# Patient Record
Sex: Female | Born: 1955 | Race: Black or African American | Hispanic: No | Marital: Married | Smoking: Never smoker
Health system: Southern US, Community
[De-identification: ages and names within clinical notes are randomized; demographics above are authoritative.]

## PROBLEM LIST (undated history)

## (undated) ENCOUNTER — Emergency Department (HOSPITAL_COMMUNITY): Disposition: A | Discharge status: Home / Self Care | Payer: Self-pay

## (undated) DIAGNOSIS — N898 Other specified noninflammatory disorders of vagina: Secondary | ICD-10-CM

## (undated) DIAGNOSIS — N951 Menopausal and female climacteric states: Secondary | ICD-10-CM

## (undated) DIAGNOSIS — R011 Cardiac murmur, unspecified: Secondary | ICD-10-CM

## (undated) DIAGNOSIS — D219 Benign neoplasm of connective and other soft tissue, unspecified: Secondary | ICD-10-CM

## (undated) DIAGNOSIS — J45909 Unspecified asthma, uncomplicated: Secondary | ICD-10-CM

## (undated) DIAGNOSIS — K219 Gastro-esophageal reflux disease without esophagitis: Secondary | ICD-10-CM

## (undated) DIAGNOSIS — N952 Postmenopausal atrophic vaginitis: Secondary | ICD-10-CM

## (undated) DIAGNOSIS — T7840XA Allergy, unspecified, initial encounter: Secondary | ICD-10-CM

## (undated) DIAGNOSIS — Z8742 Personal history of other diseases of the female genital tract: Secondary | ICD-10-CM

## (undated) DIAGNOSIS — F32A Depression, unspecified: Secondary | ICD-10-CM

## (undated) DIAGNOSIS — M1990 Unspecified osteoarthritis, unspecified site: Secondary | ICD-10-CM

## (undated) DIAGNOSIS — R6882 Decreased libido: Secondary | ICD-10-CM

## (undated) DIAGNOSIS — R809 Proteinuria, unspecified: Secondary | ICD-10-CM

## (undated) DIAGNOSIS — I10 Essential (primary) hypertension: Secondary | ICD-10-CM

## (undated) DIAGNOSIS — D573 Sickle-cell trait: Secondary | ICD-10-CM

## (undated) DIAGNOSIS — IMO0002 Reserved for concepts with insufficient information to code with codable children: Secondary | ICD-10-CM

## (undated) DIAGNOSIS — E785 Hyperlipidemia, unspecified: Secondary | ICD-10-CM

## (undated) DIAGNOSIS — R102 Pelvic and perineal pain: Secondary | ICD-10-CM

## (undated) DIAGNOSIS — B373 Candidiasis of vulva and vagina: Secondary | ICD-10-CM

## (undated) DIAGNOSIS — Z87440 Personal history of urinary (tract) infections: Secondary | ICD-10-CM

## (undated) DIAGNOSIS — R351 Nocturia: Secondary | ICD-10-CM

## (undated) DIAGNOSIS — F419 Anxiety disorder, unspecified: Secondary | ICD-10-CM

## (undated) DIAGNOSIS — N926 Irregular menstruation, unspecified: Secondary | ICD-10-CM

## (undated) DIAGNOSIS — B9689 Other specified bacterial agents as the cause of diseases classified elsewhere: Secondary | ICD-10-CM

## (undated) DIAGNOSIS — Z8619 Personal history of other infectious and parasitic diseases: Secondary | ICD-10-CM

## (undated) DIAGNOSIS — Z8719 Personal history of other diseases of the digestive system: Secondary | ICD-10-CM

## (undated) DIAGNOSIS — R300 Dysuria: Secondary | ICD-10-CM

## (undated) DIAGNOSIS — F329 Major depressive disorder, single episode, unspecified: Secondary | ICD-10-CM

## (undated) DIAGNOSIS — Z87898 Personal history of other specified conditions: Secondary | ICD-10-CM

## (undated) DIAGNOSIS — N760 Acute vaginitis: Secondary | ICD-10-CM

## (undated) HISTORY — PX: TUBAL LIGATION: SHX77

## (undated) HISTORY — DX: Irregular menstruation, unspecified: N92.6

## (undated) HISTORY — DX: Anxiety disorder, unspecified: F41.9

## (undated) HISTORY — DX: Unspecified asthma, uncomplicated: J45.909

## (undated) HISTORY — DX: Dysuria: R30.0

## (undated) HISTORY — DX: Menopausal and female climacteric states: N95.1

## (undated) HISTORY — PX: UPPER GASTROINTESTINAL ENDOSCOPY: SHX188

## (undated) HISTORY — DX: Allergy, unspecified, initial encounter: T78.40XA

## (undated) HISTORY — DX: Personal history of other infectious and parasitic diseases: Z86.19

## (undated) HISTORY — DX: Nocturia: R35.1

## (undated) HISTORY — DX: Personal history of other specified conditions: Z87.898

## (undated) HISTORY — DX: Sickle-cell trait: D57.3

## (undated) HISTORY — DX: Hyperlipidemia, unspecified: E78.5

## (undated) HISTORY — DX: Other specified noninflammatory disorders of vagina: N89.8

## (undated) HISTORY — PX: HEEL SPUR SURGERY: SHX665

## (undated) HISTORY — DX: Acute vaginitis: N76.0

## (undated) HISTORY — DX: Pelvic and perineal pain: R10.2

## (undated) HISTORY — DX: Reserved for concepts with insufficient information to code with codable children: IMO0002

## (undated) HISTORY — PX: COLONOSCOPY: SHX174

## (undated) HISTORY — DX: Other specified bacterial agents as the cause of diseases classified elsewhere: B96.89

## (undated) HISTORY — DX: Candidiasis of vulva and vagina: B37.3

## (undated) HISTORY — DX: Proteinuria, unspecified: R80.9

## (undated) HISTORY — DX: Postmenopausal atrophic vaginitis: N95.2

## (undated) HISTORY — DX: Personal history of other diseases of the female genital tract: Z87.42

## (undated) HISTORY — DX: Personal history of other diseases of the digestive system: Z87.19

## (undated) HISTORY — PX: POLYPECTOMY: SHX149

## (undated) HISTORY — DX: Personal history of urinary (tract) infections: Z87.440

## (undated) HISTORY — DX: Benign neoplasm of connective and other soft tissue, unspecified: D21.9

## (undated) HISTORY — PX: DENTAL SURGERY: SHX609

## (undated) HISTORY — DX: Decreased libido: R68.82

## (undated) HISTORY — PX: CARPAL TUNNEL RELEASE: SHX101

---

## 1997-07-24 ENCOUNTER — Encounter: Admission: RE | Admit: 1997-07-24 | Discharge: 1997-07-24 | Payer: Self-pay | Admitting: Family Medicine

## 1997-11-06 ENCOUNTER — Encounter: Admission: RE | Admit: 1997-11-06 | Discharge: 1997-11-06 | Payer: Self-pay | Admitting: Family Medicine

## 1997-11-11 ENCOUNTER — Encounter: Admission: RE | Admit: 1997-11-11 | Discharge: 1997-11-11 | Payer: Self-pay | Admitting: Sports Medicine

## 1997-11-20 ENCOUNTER — Encounter: Admission: RE | Admit: 1997-11-20 | Discharge: 1997-11-20 | Payer: Self-pay | Admitting: Family Medicine

## 1997-11-27 ENCOUNTER — Encounter: Admission: RE | Admit: 1997-11-27 | Discharge: 1997-11-27 | Payer: Self-pay | Admitting: Family Medicine

## 1997-12-08 ENCOUNTER — Encounter: Admission: RE | Admit: 1997-12-08 | Discharge: 1997-12-08 | Payer: Self-pay | Admitting: Family Medicine

## 1998-03-11 ENCOUNTER — Encounter: Admission: RE | Admit: 1998-03-11 | Discharge: 1998-03-11 | Payer: Self-pay | Admitting: Family Medicine

## 1998-05-29 ENCOUNTER — Encounter: Admission: RE | Admit: 1998-05-29 | Discharge: 1998-05-29 | Payer: Self-pay | Admitting: Family Medicine

## 1998-06-12 ENCOUNTER — Encounter: Admission: RE | Admit: 1998-06-12 | Discharge: 1998-06-12 | Payer: Self-pay | Admitting: Family Medicine

## 1998-06-29 ENCOUNTER — Encounter: Admission: RE | Admit: 1998-06-29 | Discharge: 1998-06-29 | Payer: Self-pay | Admitting: Sports Medicine

## 1998-07-07 ENCOUNTER — Encounter: Admission: RE | Admit: 1998-07-07 | Discharge: 1998-07-07 | Payer: Self-pay | Admitting: Sports Medicine

## 1998-07-08 ENCOUNTER — Emergency Department (HOSPITAL_COMMUNITY): Admission: EM | Admit: 1998-07-08 | Discharge: 1998-07-08 | Payer: Self-pay | Admitting: Emergency Medicine

## 1998-08-10 ENCOUNTER — Encounter: Admission: RE | Admit: 1998-08-10 | Discharge: 1998-08-10 | Payer: Self-pay | Admitting: Family Medicine

## 1998-08-28 ENCOUNTER — Other Ambulatory Visit: Admission: RE | Admit: 1998-08-28 | Discharge: 1998-08-28 | Payer: Self-pay | Admitting: *Deleted

## 1998-08-28 ENCOUNTER — Encounter: Admission: RE | Admit: 1998-08-28 | Discharge: 1998-08-28 | Payer: Self-pay | Admitting: Family Medicine

## 1998-10-09 ENCOUNTER — Encounter: Admission: RE | Admit: 1998-10-09 | Discharge: 1998-10-09 | Payer: Self-pay | Admitting: Family Medicine

## 1998-10-13 ENCOUNTER — Encounter: Admission: RE | Admit: 1998-10-13 | Discharge: 1998-10-13 | Payer: Self-pay | Admitting: Sports Medicine

## 1998-10-23 ENCOUNTER — Encounter: Admission: RE | Admit: 1998-10-23 | Discharge: 1998-10-23 | Payer: Self-pay | Admitting: Family Medicine

## 1998-11-09 ENCOUNTER — Encounter: Admission: RE | Admit: 1998-11-09 | Discharge: 1998-11-09 | Payer: Self-pay | Admitting: Family Medicine

## 1999-02-19 ENCOUNTER — Encounter: Admission: RE | Admit: 1999-02-19 | Discharge: 1999-02-19 | Payer: Self-pay | Admitting: Family Medicine

## 1999-03-30 ENCOUNTER — Encounter: Admission: RE | Admit: 1999-03-30 | Discharge: 1999-03-30 | Payer: Self-pay | Admitting: Family Medicine

## 1999-08-16 ENCOUNTER — Encounter: Admission: RE | Admit: 1999-08-16 | Discharge: 1999-08-16 | Payer: Self-pay | Admitting: Family Medicine

## 2000-09-19 ENCOUNTER — Encounter: Admission: RE | Admit: 2000-09-19 | Discharge: 2000-09-19 | Payer: Self-pay | Admitting: Family Medicine

## 2001-01-04 ENCOUNTER — Encounter: Admission: RE | Admit: 2001-01-04 | Discharge: 2001-01-04 | Payer: Self-pay | Admitting: Family Medicine

## 2001-01-04 ENCOUNTER — Other Ambulatory Visit: Admission: RE | Admit: 2001-01-04 | Discharge: 2001-01-04 | Payer: Self-pay | Admitting: *Deleted

## 2001-01-26 ENCOUNTER — Encounter: Admission: RE | Admit: 2001-01-26 | Discharge: 2001-01-26 | Payer: Self-pay | Admitting: Family Medicine

## 2001-08-24 ENCOUNTER — Encounter: Admission: RE | Admit: 2001-08-24 | Discharge: 2001-08-24 | Payer: Self-pay | Admitting: Family Medicine

## 2001-10-31 ENCOUNTER — Encounter: Admission: RE | Admit: 2001-10-31 | Discharge: 2001-10-31 | Payer: Self-pay | Admitting: Family Medicine

## 2001-11-02 ENCOUNTER — Encounter: Admission: RE | Admit: 2001-11-02 | Discharge: 2001-11-02 | Payer: Self-pay | Admitting: Family Medicine

## 2002-03-08 ENCOUNTER — Encounter: Admission: RE | Admit: 2002-03-08 | Discharge: 2002-03-08 | Payer: Self-pay | Admitting: Family Medicine

## 2002-03-21 ENCOUNTER — Other Ambulatory Visit: Admission: RE | Admit: 2002-03-21 | Discharge: 2002-03-21 | Payer: Self-pay | Admitting: Family Medicine

## 2002-03-21 ENCOUNTER — Encounter: Admission: RE | Admit: 2002-03-21 | Discharge: 2002-03-21 | Payer: Self-pay | Admitting: Family Medicine

## 2002-07-25 ENCOUNTER — Encounter: Admission: RE | Admit: 2002-07-25 | Discharge: 2002-07-25 | Payer: Self-pay | Admitting: Family Medicine

## 2002-08-07 ENCOUNTER — Encounter: Admission: RE | Admit: 2002-08-07 | Discharge: 2002-08-07 | Payer: Self-pay | Admitting: Family Medicine

## 2002-09-12 ENCOUNTER — Encounter: Admission: RE | Admit: 2002-09-12 | Discharge: 2002-09-12 | Payer: Self-pay | Admitting: Family Medicine

## 2002-10-02 ENCOUNTER — Encounter: Admission: RE | Admit: 2002-10-02 | Discharge: 2002-10-02 | Payer: Self-pay | Admitting: Family Medicine

## 2002-11-07 ENCOUNTER — Encounter: Admission: RE | Admit: 2002-11-07 | Discharge: 2002-11-07 | Payer: Self-pay | Admitting: Family Medicine

## 2002-11-21 ENCOUNTER — Encounter: Admission: RE | Admit: 2002-11-21 | Discharge: 2002-11-21 | Payer: Self-pay | Admitting: Family Medicine

## 2002-12-04 ENCOUNTER — Encounter: Admission: RE | Admit: 2002-12-04 | Discharge: 2002-12-04 | Payer: Self-pay | Admitting: Family Medicine

## 2003-03-27 ENCOUNTER — Encounter: Admission: RE | Admit: 2003-03-27 | Discharge: 2003-03-27 | Payer: Self-pay | Admitting: Allergy and Immunology

## 2003-04-19 DIAGNOSIS — B9689 Other specified bacterial agents as the cause of diseases classified elsewhere: Secondary | ICD-10-CM

## 2003-04-19 DIAGNOSIS — R102 Pelvic and perineal pain: Secondary | ICD-10-CM

## 2003-04-19 DIAGNOSIS — D219 Benign neoplasm of connective and other soft tissue, unspecified: Secondary | ICD-10-CM

## 2003-04-19 DIAGNOSIS — Z8742 Personal history of other diseases of the female genital tract: Secondary | ICD-10-CM

## 2003-04-19 HISTORY — DX: Personal history of other diseases of the female genital tract: Z87.42

## 2003-04-19 HISTORY — DX: Benign neoplasm of connective and other soft tissue, unspecified: D21.9

## 2003-04-19 HISTORY — DX: Other specified bacterial agents as the cause of diseases classified elsewhere: B96.89

## 2003-04-19 HISTORY — DX: Pelvic and perineal pain: R10.2

## 2003-08-15 ENCOUNTER — Other Ambulatory Visit: Admission: RE | Admit: 2003-08-15 | Discharge: 2003-08-15 | Payer: Self-pay | Admitting: Family Medicine

## 2003-08-15 ENCOUNTER — Encounter: Admission: RE | Admit: 2003-08-15 | Discharge: 2003-08-15 | Payer: Self-pay | Admitting: Sports Medicine

## 2003-09-17 ENCOUNTER — Encounter: Admission: RE | Admit: 2003-09-17 | Discharge: 2003-09-17 | Payer: Self-pay | Admitting: Family Medicine

## 2003-09-24 ENCOUNTER — Encounter: Admission: RE | Admit: 2003-09-24 | Discharge: 2003-09-24 | Payer: Self-pay | Admitting: Family Medicine

## 2003-10-15 ENCOUNTER — Encounter: Admission: RE | Admit: 2003-10-15 | Discharge: 2003-10-15 | Payer: Self-pay | Admitting: Family Medicine

## 2003-10-15 ENCOUNTER — Encounter: Admission: RE | Admit: 2003-10-15 | Discharge: 2003-10-15 | Payer: Self-pay | Admitting: Sports Medicine

## 2003-10-30 ENCOUNTER — Emergency Department (HOSPITAL_COMMUNITY): Admission: EM | Admit: 2003-10-30 | Discharge: 2003-10-30 | Payer: Self-pay | Admitting: Family Medicine

## 2003-11-04 ENCOUNTER — Emergency Department (HOSPITAL_COMMUNITY): Admission: EM | Admit: 2003-11-04 | Discharge: 2003-11-04 | Payer: Self-pay | Admitting: Emergency Medicine

## 2003-11-04 ENCOUNTER — Encounter: Admission: RE | Admit: 2003-11-04 | Discharge: 2003-11-04 | Payer: Self-pay | Admitting: Family Medicine

## 2003-11-08 ENCOUNTER — Emergency Department (HOSPITAL_COMMUNITY): Admission: EM | Admit: 2003-11-08 | Discharge: 2003-11-08 | Payer: Self-pay | Admitting: Emergency Medicine

## 2003-11-11 ENCOUNTER — Encounter: Admission: RE | Admit: 2003-11-11 | Discharge: 2003-11-11 | Payer: Self-pay | Admitting: Family Medicine

## 2003-11-25 ENCOUNTER — Encounter: Admission: RE | Admit: 2003-11-25 | Discharge: 2003-11-25 | Payer: Self-pay | Admitting: Family Medicine

## 2004-01-08 ENCOUNTER — Ambulatory Visit: Payer: Self-pay | Admitting: Family Medicine

## 2004-01-24 ENCOUNTER — Emergency Department (HOSPITAL_COMMUNITY): Admission: EM | Admit: 2004-01-24 | Discharge: 2004-01-24 | Payer: Self-pay | Admitting: Emergency Medicine

## 2004-02-02 ENCOUNTER — Encounter: Admission: RE | Admit: 2004-02-02 | Discharge: 2004-02-02 | Payer: Self-pay | Admitting: Allergy and Immunology

## 2004-02-03 ENCOUNTER — Encounter: Admission: RE | Admit: 2004-02-03 | Discharge: 2004-02-03 | Payer: Self-pay | Admitting: Allergy and Immunology

## 2004-03-09 ENCOUNTER — Ambulatory Visit: Payer: Self-pay | Admitting: Family Medicine

## 2004-04-01 ENCOUNTER — Ambulatory Visit: Payer: Self-pay | Admitting: Family Medicine

## 2004-04-09 ENCOUNTER — Ambulatory Visit: Payer: Self-pay | Admitting: Infectious Diseases

## 2004-04-18 DIAGNOSIS — B373 Candidiasis of vulva and vagina: Secondary | ICD-10-CM

## 2004-04-18 DIAGNOSIS — B3731 Acute candidiasis of vulva and vagina: Secondary | ICD-10-CM

## 2004-04-18 DIAGNOSIS — R102 Pelvic and perineal pain: Secondary | ICD-10-CM

## 2004-04-18 DIAGNOSIS — R6882 Decreased libido: Secondary | ICD-10-CM

## 2004-04-18 HISTORY — DX: Candidiasis of vulva and vagina: B37.3

## 2004-04-18 HISTORY — DX: Pelvic and perineal pain: R10.2

## 2004-04-18 HISTORY — DX: Decreased libido: R68.82

## 2004-04-18 HISTORY — DX: Acute candidiasis of vulva and vagina: B37.31

## 2004-04-23 ENCOUNTER — Ambulatory Visit: Payer: Self-pay | Admitting: Family Medicine

## 2004-05-07 ENCOUNTER — Ambulatory Visit: Payer: Self-pay | Admitting: Sports Medicine

## 2004-05-16 ENCOUNTER — Emergency Department (HOSPITAL_COMMUNITY): Admission: EM | Admit: 2004-05-16 | Discharge: 2004-05-16 | Payer: Self-pay | Admitting: Family Medicine

## 2004-05-26 ENCOUNTER — Ambulatory Visit: Payer: Self-pay | Admitting: Family Medicine

## 2004-06-08 ENCOUNTER — Emergency Department (HOSPITAL_COMMUNITY): Admission: EM | Admit: 2004-06-08 | Discharge: 2004-06-08 | Payer: Self-pay | Admitting: Family Medicine

## 2004-07-14 ENCOUNTER — Ambulatory Visit: Payer: Self-pay | Admitting: Family Medicine

## 2004-08-16 ENCOUNTER — Encounter (INDEPENDENT_AMBULATORY_CARE_PROVIDER_SITE_OTHER): Payer: Self-pay | Admitting: *Deleted

## 2004-08-16 LAB — CONVERTED CEMR LAB

## 2004-08-24 ENCOUNTER — Ambulatory Visit: Payer: Self-pay | Admitting: Family Medicine

## 2004-09-06 ENCOUNTER — Ambulatory Visit: Payer: Self-pay | Admitting: Family Medicine

## 2004-11-12 ENCOUNTER — Ambulatory Visit: Payer: Self-pay | Admitting: Family Medicine

## 2005-01-04 ENCOUNTER — Ambulatory Visit: Payer: Self-pay | Admitting: Sports Medicine

## 2005-01-08 ENCOUNTER — Emergency Department (HOSPITAL_COMMUNITY): Admission: EM | Admit: 2005-01-08 | Discharge: 2005-01-08 | Payer: Self-pay | Admitting: Emergency Medicine

## 2005-01-11 ENCOUNTER — Other Ambulatory Visit: Admission: RE | Admit: 2005-01-11 | Discharge: 2005-01-11 | Payer: Self-pay | Admitting: Obstetrics and Gynecology

## 2005-03-23 ENCOUNTER — Emergency Department (HOSPITAL_COMMUNITY): Admission: EM | Admit: 2005-03-23 | Discharge: 2005-03-23 | Payer: Self-pay | Admitting: Family Medicine

## 2005-03-31 ENCOUNTER — Emergency Department (HOSPITAL_COMMUNITY): Admission: EM | Admit: 2005-03-31 | Discharge: 2005-03-31 | Payer: Self-pay | Admitting: Family Medicine

## 2005-04-18 DIAGNOSIS — N926 Irregular menstruation, unspecified: Secondary | ICD-10-CM

## 2005-04-18 DIAGNOSIS — N951 Menopausal and female climacteric states: Secondary | ICD-10-CM

## 2005-04-18 DIAGNOSIS — Z87440 Personal history of urinary (tract) infections: Secondary | ICD-10-CM

## 2005-04-18 DIAGNOSIS — R300 Dysuria: Secondary | ICD-10-CM

## 2005-04-18 HISTORY — DX: Dysuria: R30.0

## 2005-04-18 HISTORY — DX: Irregular menstruation, unspecified: N92.6

## 2005-04-18 HISTORY — DX: Menopausal and female climacteric states: N95.1

## 2005-04-18 HISTORY — DX: Personal history of urinary (tract) infections: Z87.440

## 2005-05-06 ENCOUNTER — Ambulatory Visit: Payer: Self-pay | Admitting: Family Medicine

## 2005-06-22 ENCOUNTER — Emergency Department (HOSPITAL_COMMUNITY): Admission: EM | Admit: 2005-06-22 | Discharge: 2005-06-22 | Payer: Self-pay | Admitting: Family Medicine

## 2005-06-28 ENCOUNTER — Ambulatory Visit: Payer: Self-pay | Admitting: Family Medicine

## 2005-07-01 ENCOUNTER — Ambulatory Visit: Payer: Self-pay | Admitting: Sports Medicine

## 2005-09-01 ENCOUNTER — Emergency Department (HOSPITAL_COMMUNITY): Admission: EM | Admit: 2005-09-01 | Discharge: 2005-09-01 | Payer: Self-pay | Admitting: Emergency Medicine

## 2005-09-30 ENCOUNTER — Ambulatory Visit: Payer: Self-pay | Admitting: Family Medicine

## 2005-11-14 ENCOUNTER — Ambulatory Visit: Payer: Self-pay | Admitting: Sports Medicine

## 2006-01-19 ENCOUNTER — Other Ambulatory Visit: Admission: RE | Admit: 2006-01-19 | Discharge: 2006-01-19 | Payer: Self-pay | Admitting: Obstetrics and Gynecology

## 2006-04-18 DIAGNOSIS — IMO0002 Reserved for concepts with insufficient information to code with codable children: Secondary | ICD-10-CM

## 2006-04-18 HISTORY — DX: Reserved for concepts with insufficient information to code with codable children: IMO0002

## 2006-04-24 ENCOUNTER — Ambulatory Visit: Payer: Self-pay | Admitting: Family Medicine

## 2006-06-04 ENCOUNTER — Emergency Department (HOSPITAL_COMMUNITY): Admission: EM | Admit: 2006-06-04 | Discharge: 2006-06-04 | Payer: Self-pay | Admitting: Emergency Medicine

## 2006-06-15 DIAGNOSIS — K279 Peptic ulcer, site unspecified, unspecified as acute or chronic, without hemorrhage or perforation: Secondary | ICD-10-CM | POA: Insufficient documentation

## 2006-06-15 DIAGNOSIS — J309 Allergic rhinitis, unspecified: Secondary | ICD-10-CM | POA: Insufficient documentation

## 2006-06-15 DIAGNOSIS — K219 Gastro-esophageal reflux disease without esophagitis: Secondary | ICD-10-CM

## 2006-06-15 DIAGNOSIS — D573 Sickle-cell trait: Secondary | ICD-10-CM | POA: Insufficient documentation

## 2006-06-15 DIAGNOSIS — D509 Iron deficiency anemia, unspecified: Secondary | ICD-10-CM

## 2006-06-16 ENCOUNTER — Encounter (INDEPENDENT_AMBULATORY_CARE_PROVIDER_SITE_OTHER): Payer: Self-pay | Admitting: *Deleted

## 2006-09-15 ENCOUNTER — Encounter (INDEPENDENT_AMBULATORY_CARE_PROVIDER_SITE_OTHER): Payer: Self-pay | Admitting: Family Medicine

## 2006-12-06 ENCOUNTER — Encounter: Payer: Self-pay | Admitting: Family Medicine

## 2006-12-06 ENCOUNTER — Ambulatory Visit: Payer: Self-pay | Admitting: Family Medicine

## 2006-12-06 LAB — CONVERTED CEMR LAB
Chlamydia, DNA Probe: NEGATIVE
GC Probe Amp, Genital: NEGATIVE
KOH Prep: NEGATIVE
Whiff Test: NEGATIVE

## 2006-12-07 ENCOUNTER — Encounter: Payer: Self-pay | Admitting: Family Medicine

## 2007-02-02 ENCOUNTER — Telehealth: Payer: Self-pay | Admitting: *Deleted

## 2007-02-03 ENCOUNTER — Emergency Department (HOSPITAL_COMMUNITY): Admission: EM | Admit: 2007-02-03 | Discharge: 2007-02-03 | Payer: Self-pay | Admitting: Emergency Medicine

## 2007-02-05 ENCOUNTER — Ambulatory Visit: Payer: Self-pay | Admitting: Sports Medicine

## 2007-02-05 DIAGNOSIS — I10 Essential (primary) hypertension: Secondary | ICD-10-CM

## 2007-04-19 ENCOUNTER — Emergency Department (HOSPITAL_COMMUNITY): Admission: EM | Admit: 2007-04-19 | Discharge: 2007-04-19 | Payer: Self-pay | Admitting: Emergency Medicine

## 2007-04-19 DIAGNOSIS — R809 Proteinuria, unspecified: Secondary | ICD-10-CM

## 2007-04-19 DIAGNOSIS — Z8719 Personal history of other diseases of the digestive system: Secondary | ICD-10-CM

## 2007-04-19 DIAGNOSIS — N952 Postmenopausal atrophic vaginitis: Secondary | ICD-10-CM

## 2007-04-19 DIAGNOSIS — R351 Nocturia: Secondary | ICD-10-CM

## 2007-04-19 HISTORY — DX: Proteinuria, unspecified: R80.9

## 2007-04-19 HISTORY — DX: Nocturia: R35.1

## 2007-04-19 HISTORY — DX: Personal history of other diseases of the digestive system: Z87.19

## 2007-04-19 HISTORY — DX: Postmenopausal atrophic vaginitis: N95.2

## 2007-05-07 ENCOUNTER — Emergency Department (HOSPITAL_COMMUNITY): Admission: EM | Admit: 2007-05-07 | Discharge: 2007-05-07 | Payer: Self-pay | Admitting: Family Medicine

## 2007-07-06 ENCOUNTER — Telehealth: Payer: Self-pay | Admitting: *Deleted

## 2007-07-13 ENCOUNTER — Ambulatory Visit: Payer: Self-pay | Admitting: Family Medicine

## 2007-07-25 ENCOUNTER — Ambulatory Visit: Payer: Self-pay | Admitting: Family Medicine

## 2007-07-30 ENCOUNTER — Emergency Department (HOSPITAL_COMMUNITY): Admission: EM | Admit: 2007-07-30 | Discharge: 2007-07-30 | Payer: Self-pay | Admitting: Family Medicine

## 2007-08-17 ENCOUNTER — Emergency Department (HOSPITAL_COMMUNITY): Admission: EM | Admit: 2007-08-17 | Discharge: 2007-08-17 | Payer: Self-pay | Admitting: Family Medicine

## 2007-10-26 ENCOUNTER — Emergency Department (HOSPITAL_COMMUNITY): Admission: EM | Admit: 2007-10-26 | Discharge: 2007-10-26 | Payer: Self-pay | Admitting: Family Medicine

## 2007-11-19 ENCOUNTER — Encounter: Payer: Self-pay | Admitting: *Deleted

## 2007-11-23 ENCOUNTER — Encounter (INDEPENDENT_AMBULATORY_CARE_PROVIDER_SITE_OTHER): Payer: Self-pay | Admitting: Family Medicine

## 2007-11-23 ENCOUNTER — Ambulatory Visit: Payer: Self-pay | Admitting: Family Medicine

## 2007-11-23 DIAGNOSIS — R011 Cardiac murmur, unspecified: Secondary | ICD-10-CM

## 2007-11-23 LAB — CONVERTED CEMR LAB
ALT: <8 units/L (ref 0–35)
AST: 14 units/L (ref 0–37)
Albumin: 4.3 g/dL (ref 3.5–5.2)
Alkaline Phosphatase: 56 units/L (ref 39–117)
BUN: 8 mg/dL (ref 6–23)
Bilirubin Urine: NEGATIVE
Blood in Urine, dipstick: NEGATIVE
CO2: 20 meq/L (ref 19–32)
Calcium: 9 mg/dL (ref 8.4–10.5)
Chlamydia, DNA Probe: NEGATIVE
Chloride: 102 meq/L (ref 96–112)
Cholesterol: 198 mg/dL (ref 0–200)
Creatinine, Ser: 1.14 mg/dL (ref 0.40–1.20)
GC Probe Amp, Genital: NEGATIVE
Glucose, Bld: 147 mg/dL — ABNORMAL HIGH (ref 70–99)
Glucose, Urine, Semiquant: NEGATIVE
HDL: 58 mg/dL (ref >39)
Ketones, urine, test strip: NEGATIVE
LDL Cholesterol: 125 mg/dL — ABNORMAL HIGH (ref 0–99)
Nitrite: NEGATIVE
Pap Smear: NORMAL
Potassium: 4 meq/L (ref 3.5–5.3)
Sodium: 137 meq/L (ref 135–145)
Specific Gravity, Urine: >=1.03
Total Bilirubin: 0.3 mg/dL (ref 0.3–1.2)
Total CHOL/HDL Ratio: 3.4
Total Protein: 7.6 g/dL (ref 6.0–8.3)
Triglycerides: 74 mg/dL (ref <150)
Urobilinogen, UA: 0.2
VLDL: 15 mg/dL (ref 0–40)
WBC Urine, dipstick: NEGATIVE
Whiff Test: NEGATIVE
pH: 6.5

## 2007-11-29 ENCOUNTER — Encounter: Payer: Self-pay | Admitting: *Deleted

## 2007-11-29 DIAGNOSIS — N950 Postmenopausal bleeding: Secondary | ICD-10-CM

## 2007-12-03 ENCOUNTER — Telehealth: Payer: Self-pay | Admitting: *Deleted

## 2007-12-04 ENCOUNTER — Ambulatory Visit: Payer: Self-pay | Admitting: Family Medicine

## 2007-12-04 LAB — CONVERTED CEMR LAB: Blood Glucose, AC Bkfst: 103 mg/dL

## 2007-12-11 ENCOUNTER — Ambulatory Visit (HOSPITAL_COMMUNITY): Admission: RE | Admit: 2007-12-11 | Discharge: 2007-12-11 | Payer: Self-pay | Admitting: Family Medicine

## 2007-12-14 ENCOUNTER — Telehealth: Payer: Self-pay | Admitting: *Deleted

## 2007-12-18 ENCOUNTER — Ambulatory Visit: Payer: Self-pay | Admitting: Sports Medicine

## 2007-12-18 LAB — CONVERTED CEMR LAB: H Pylori IgG: NEGATIVE

## 2007-12-19 ENCOUNTER — Encounter (INDEPENDENT_AMBULATORY_CARE_PROVIDER_SITE_OTHER): Payer: Self-pay | Admitting: Family Medicine

## 2007-12-19 LAB — CONVERTED CEMR LAB
ALT: 11 units/L (ref 0–35)
AST: 15 units/L (ref 0–37)
Albumin: 4.7 g/dL (ref 3.5–5.2)
Alkaline Phosphatase: 62 units/L (ref 39–117)
Bilirubin, Direct: 0.1 mg/dL (ref 0.0–0.3)
HCT: 37.1 % (ref 36.0–46.0)
Hemoglobin: 11.7 g/dL — ABNORMAL LOW (ref 12.0–15.0)
Indirect Bilirubin: 0.3 mg/dL (ref 0.0–0.9)
MCHC: 31.5 g/dL (ref 30.0–36.0)
MCV: 84.7 fL (ref 78.0–100.0)
Platelets: 363 10*3/uL (ref 150–400)
RBC: 4.38 M/uL (ref 3.87–5.11)
RDW: 13.7 % (ref 11.5–15.5)
Total Bilirubin: 0.4 mg/dL (ref 0.3–1.2)
Total Protein: 8.7 g/dL — ABNORMAL HIGH (ref 6.0–8.3)
WBC: 4.8 10*3/uL (ref 4.0–10.5)

## 2007-12-25 ENCOUNTER — Encounter (INDEPENDENT_AMBULATORY_CARE_PROVIDER_SITE_OTHER): Payer: Self-pay | Admitting: *Deleted

## 2008-01-04 ENCOUNTER — Encounter (INDEPENDENT_AMBULATORY_CARE_PROVIDER_SITE_OTHER): Payer: Self-pay | Admitting: *Deleted

## 2008-01-10 ENCOUNTER — Telehealth (INDEPENDENT_AMBULATORY_CARE_PROVIDER_SITE_OTHER): Payer: Self-pay | Admitting: Family Medicine

## 2008-02-05 ENCOUNTER — Telehealth: Payer: Self-pay | Admitting: *Deleted

## 2008-02-06 ENCOUNTER — Ambulatory Visit: Payer: Self-pay | Admitting: Family Medicine

## 2008-02-11 ENCOUNTER — Ambulatory Visit: Payer: Self-pay | Admitting: Family Medicine

## 2008-02-27 ENCOUNTER — Encounter: Payer: Self-pay | Admitting: Family Medicine

## 2008-02-27 ENCOUNTER — Ambulatory Visit: Payer: Self-pay | Admitting: Cardiology

## 2008-02-27 ENCOUNTER — Ambulatory Visit (HOSPITAL_COMMUNITY): Admission: RE | Admit: 2008-02-27 | Discharge: 2008-02-27 | Payer: Self-pay | Admitting: Family Medicine

## 2008-03-05 ENCOUNTER — Ambulatory Visit: Payer: Self-pay | Admitting: Family Medicine

## 2008-03-12 ENCOUNTER — Encounter (INDEPENDENT_AMBULATORY_CARE_PROVIDER_SITE_OTHER): Payer: Self-pay | Admitting: Family Medicine

## 2008-04-02 ENCOUNTER — Encounter (INDEPENDENT_AMBULATORY_CARE_PROVIDER_SITE_OTHER): Payer: Self-pay | Admitting: Family Medicine

## 2008-04-17 ENCOUNTER — Emergency Department (HOSPITAL_COMMUNITY): Admission: EM | Admit: 2008-04-17 | Discharge: 2008-04-17 | Payer: Self-pay | Admitting: Family Medicine

## 2008-04-21 ENCOUNTER — Telehealth: Payer: Self-pay | Admitting: *Deleted

## 2008-04-22 ENCOUNTER — Ambulatory Visit: Payer: Self-pay | Admitting: Family Medicine

## 2008-04-22 LAB — CONVERTED CEMR LAB
Bilirubin Urine: NEGATIVE
Blood in Urine, dipstick: NEGATIVE
Epithelial cells, urine: >20 /lpf
Glucose, Urine, Semiquant: NEGATIVE
Ketones, urine, test strip: NEGATIVE
Nitrite: NEGATIVE
Protein, U semiquant: 30
Specific Gravity, Urine: 1.02
Urobilinogen, UA: 0.2
WBC Urine, dipstick: NEGATIVE
Whiff Test: NEGATIVE
pH: 5.5

## 2008-04-23 ENCOUNTER — Encounter (INDEPENDENT_AMBULATORY_CARE_PROVIDER_SITE_OTHER): Payer: Self-pay | Admitting: Family Medicine

## 2008-04-23 LAB — CONVERTED CEMR LAB
Chlamydia, DNA Probe: NEGATIVE
GC Probe Amp, Genital: NEGATIVE

## 2008-04-24 ENCOUNTER — Encounter (INDEPENDENT_AMBULATORY_CARE_PROVIDER_SITE_OTHER): Payer: Self-pay | Admitting: Family Medicine

## 2008-05-08 ENCOUNTER — Ambulatory Visit: Payer: Self-pay | Admitting: Family Medicine

## 2008-05-08 ENCOUNTER — Encounter (INDEPENDENT_AMBULATORY_CARE_PROVIDER_SITE_OTHER): Payer: Self-pay | Admitting: Family Medicine

## 2008-05-08 DIAGNOSIS — N952 Postmenopausal atrophic vaginitis: Secondary | ICD-10-CM

## 2008-05-08 LAB — CONVERTED CEMR LAB
ALT: <8 units/L (ref 0–35)
AST: 13 units/L (ref 0–37)
Albumin: 4.4 g/dL (ref 3.5–5.2)
Alkaline Phosphatase: 67 units/L (ref 39–117)
BUN: 7 mg/dL (ref 6–23)
CO2: 26 meq/L (ref 19–32)
Calcium: 9.4 mg/dL (ref 8.4–10.5)
Chloride: 104 meq/L (ref 96–112)
Creatinine, Ser: 0.99 mg/dL (ref 0.40–1.20)
Glucose, Bld: 82 mg/dL (ref 70–99)
Potassium: 4.3 meq/L (ref 3.5–5.3)
Sodium: 141 meq/L (ref 135–145)
Total Bilirubin: 0.2 mg/dL — ABNORMAL LOW (ref 0.3–1.2)
Total Protein: 8 g/dL (ref 6.0–8.3)

## 2008-05-12 ENCOUNTER — Encounter (INDEPENDENT_AMBULATORY_CARE_PROVIDER_SITE_OTHER): Payer: Self-pay | Admitting: Family Medicine

## 2008-06-05 ENCOUNTER — Ambulatory Visit: Payer: Self-pay | Admitting: Family Medicine

## 2008-06-05 LAB — CONVERTED CEMR LAB
KOH Prep: NEGATIVE
Whiff Test: POSITIVE

## 2008-07-30 ENCOUNTER — Ambulatory Visit: Payer: Self-pay | Admitting: Family Medicine

## 2008-07-30 DIAGNOSIS — F329 Major depressive disorder, single episode, unspecified: Secondary | ICD-10-CM

## 2008-07-30 DIAGNOSIS — F419 Anxiety disorder, unspecified: Secondary | ICD-10-CM

## 2008-09-04 ENCOUNTER — Encounter (INDEPENDENT_AMBULATORY_CARE_PROVIDER_SITE_OTHER): Payer: Self-pay | Admitting: Family Medicine

## 2008-09-04 ENCOUNTER — Ambulatory Visit: Payer: Self-pay | Admitting: Family Medicine

## 2008-09-04 ENCOUNTER — Telehealth (INDEPENDENT_AMBULATORY_CARE_PROVIDER_SITE_OTHER): Payer: Self-pay | Admitting: Family Medicine

## 2008-09-04 DIAGNOSIS — R350 Frequency of micturition: Secondary | ICD-10-CM | POA: Insufficient documentation

## 2008-09-04 LAB — CONVERTED CEMR LAB
Bilirubin Urine: NEGATIVE
Blood in Urine, dipstick: NEGATIVE
Chlamydia, DNA Probe: NEGATIVE
GC Probe Amp, Genital: NEGATIVE
Glucose, Urine, Semiquant: NEGATIVE
Ketones, urine, test strip: NEGATIVE
Nitrite: NEGATIVE
Protein, U semiquant: 30
Specific Gravity, Urine: 1.02
Urobilinogen, UA: 0.2
WBC Urine, dipstick: NEGATIVE
Whiff Test: POSITIVE
pH: 6

## 2008-09-11 ENCOUNTER — Telehealth (INDEPENDENT_AMBULATORY_CARE_PROVIDER_SITE_OTHER): Payer: Self-pay | Admitting: Family Medicine

## 2008-09-11 ENCOUNTER — Ambulatory Visit: Payer: Self-pay | Admitting: Family Medicine

## 2008-10-07 ENCOUNTER — Telehealth: Payer: Self-pay | Admitting: Family Medicine

## 2008-10-08 ENCOUNTER — Ambulatory Visit: Payer: Self-pay | Admitting: Family Medicine

## 2008-10-08 LAB — CONVERTED CEMR LAB
Bilirubin Urine: NEGATIVE
Blood in Urine, dipstick: NEGATIVE
Glucose, Urine, Semiquant: NEGATIVE
Ketones, urine, test strip: NEGATIVE
Nitrite: NEGATIVE
Protein, U semiquant: NEGATIVE
Specific Gravity, Urine: 1.025
Urobilinogen, UA: 0.2
WBC Urine, dipstick: NEGATIVE
Whiff Test: NEGATIVE
pH: 6

## 2008-10-10 ENCOUNTER — Telehealth: Payer: Self-pay | Admitting: Family Medicine

## 2008-10-15 ENCOUNTER — Encounter (INDEPENDENT_AMBULATORY_CARE_PROVIDER_SITE_OTHER): Payer: Self-pay | Admitting: Family Medicine

## 2008-11-17 ENCOUNTER — Encounter (INDEPENDENT_AMBULATORY_CARE_PROVIDER_SITE_OTHER): Payer: Self-pay | Admitting: Family Medicine

## 2008-11-20 ENCOUNTER — Encounter: Payer: Self-pay | Admitting: *Deleted

## 2008-11-27 ENCOUNTER — Ambulatory Visit: Payer: Self-pay | Admitting: Family Medicine

## 2008-11-27 DIAGNOSIS — L659 Nonscarring hair loss, unspecified: Secondary | ICD-10-CM | POA: Insufficient documentation

## 2008-11-27 DIAGNOSIS — R630 Anorexia: Secondary | ICD-10-CM

## 2008-12-18 ENCOUNTER — Telehealth: Payer: Self-pay | Admitting: Family Medicine

## 2008-12-19 ENCOUNTER — Ambulatory Visit: Payer: Self-pay | Admitting: Family Medicine

## 2008-12-19 DIAGNOSIS — M25569 Pain in unspecified knee: Secondary | ICD-10-CM | POA: Insufficient documentation

## 2009-02-05 ENCOUNTER — Emergency Department (HOSPITAL_COMMUNITY): Admission: EM | Admit: 2009-02-05 | Discharge: 2009-02-05 | Payer: Self-pay | Admitting: Emergency Medicine

## 2009-02-12 ENCOUNTER — Ambulatory Visit: Payer: Self-pay | Admitting: Family Medicine

## 2009-02-12 ENCOUNTER — Telehealth: Payer: Self-pay | Admitting: Family Medicine

## 2009-02-12 DIAGNOSIS — G5600 Carpal tunnel syndrome, unspecified upper limb: Secondary | ICD-10-CM

## 2009-02-13 ENCOUNTER — Encounter: Payer: Self-pay | Admitting: Family Medicine

## 2009-02-13 ENCOUNTER — Ambulatory Visit: Payer: Self-pay | Admitting: Family Medicine

## 2009-02-22 ENCOUNTER — Emergency Department (HOSPITAL_COMMUNITY): Admission: EM | Admit: 2009-02-22 | Discharge: 2009-02-22 | Payer: Self-pay | Admitting: Emergency Medicine

## 2009-04-01 ENCOUNTER — Telehealth: Payer: Self-pay | Admitting: Family Medicine

## 2009-04-18 DIAGNOSIS — Z87898 Personal history of other specified conditions: Secondary | ICD-10-CM

## 2009-04-18 DIAGNOSIS — N898 Other specified noninflammatory disorders of vagina: Secondary | ICD-10-CM

## 2009-04-18 HISTORY — DX: Personal history of other specified conditions: Z87.898

## 2009-04-18 HISTORY — DX: Other specified noninflammatory disorders of vagina: N89.8

## 2009-05-15 ENCOUNTER — Ambulatory Visit: Payer: Self-pay | Admitting: Family Medicine

## 2009-05-15 ENCOUNTER — Encounter: Payer: Self-pay | Admitting: Family Medicine

## 2009-05-15 DIAGNOSIS — N898 Other specified noninflammatory disorders of vagina: Secondary | ICD-10-CM | POA: Insufficient documentation

## 2009-05-15 LAB — CONVERTED CEMR LAB
Bilirubin Urine: NEGATIVE
Chlamydia, DNA Probe: NEGATIVE
GC Probe Amp, Genital: NEGATIVE
Glucose, Urine, Semiquant: NEGATIVE
HCV Ab: NEGATIVE
Hepatitis B Surface Ag: NEGATIVE
Ketones, urine, test strip: NEGATIVE
Nitrite: NEGATIVE
Protein, U semiquant: NEGATIVE
Specific Gravity, Urine: 1.01
Urobilinogen, UA: 0.2
WBC Urine, dipstick: NEGATIVE
Whiff Test: NEGATIVE
pH: 6

## 2009-05-18 ENCOUNTER — Encounter: Payer: Self-pay | Admitting: Family Medicine

## 2009-05-19 ENCOUNTER — Telehealth: Payer: Self-pay | Admitting: Family Medicine

## 2009-05-24 ENCOUNTER — Emergency Department (HOSPITAL_COMMUNITY): Admission: EM | Admit: 2009-05-24 | Discharge: 2009-05-24 | Payer: Self-pay | Admitting: Family Medicine

## 2009-06-23 ENCOUNTER — Encounter: Payer: Self-pay | Admitting: Family Medicine

## 2009-06-24 ENCOUNTER — Ambulatory Visit: Payer: Self-pay | Admitting: Family Medicine

## 2009-06-24 ENCOUNTER — Encounter: Payer: Self-pay | Admitting: Family Medicine

## 2009-07-31 ENCOUNTER — Ambulatory Visit: Payer: Self-pay | Admitting: Family Medicine

## 2009-07-31 LAB — CONVERTED CEMR LAB
Bilirubin Urine: NEGATIVE
Blood in Urine, dipstick: NEGATIVE
Glucose, Urine, Semiquant: NEGATIVE
Ketones, urine, test strip: NEGATIVE
Nitrite: NEGATIVE
Protein, U semiquant: NEGATIVE
Specific Gravity, Urine: 1.02
Urobilinogen, UA: 0.2
WBC Urine, dipstick: NEGATIVE
pH: 5

## 2009-08-01 ENCOUNTER — Emergency Department (HOSPITAL_COMMUNITY): Admission: EM | Admit: 2009-08-01 | Discharge: 2009-08-01 | Payer: Self-pay | Admitting: Family Medicine

## 2009-08-05 ENCOUNTER — Encounter: Payer: Self-pay | Admitting: Family Medicine

## 2009-08-05 ENCOUNTER — Ambulatory Visit: Payer: Self-pay | Admitting: Family Medicine

## 2009-08-05 LAB — CONVERTED CEMR LAB
ALT: 16 units/L (ref 0–35)
AST: 18 units/L (ref 0–37)
Albumin: 4.2 g/dL (ref 3.5–5.2)
Alkaline Phosphatase: 96 units/L (ref 39–117)
BUN: 14 mg/dL (ref 6–23)
CO2: 26 meq/L (ref 19–32)
Calcium: 9.5 mg/dL (ref 8.4–10.5)
Chloride: 101 meq/L (ref 96–112)
Cholesterol: 240 mg/dL — ABNORMAL HIGH (ref 0–200)
Creatinine, Ser: 1.17 mg/dL (ref 0.40–1.20)
Glucose, Bld: 96 mg/dL (ref 70–99)
HCT: 35 % — ABNORMAL LOW (ref 36.0–46.0)
HDL: 51 mg/dL (ref >39)
Hemoglobin: 11.1 g/dL — ABNORMAL LOW (ref 12.0–15.0)
LDL Cholesterol: 156 mg/dL — ABNORMAL HIGH (ref 0–99)
MCHC: 31.7 g/dL (ref 30.0–36.0)
MCV: 84.5 fL (ref 78.0–100.0)
Platelets: 389 10*3/uL (ref 150–400)
Potassium: 3.9 meq/L (ref 3.5–5.3)
RBC: 4.14 M/uL (ref 3.87–5.11)
RDW: 14.2 % (ref 11.5–15.5)
Sodium: 140 meq/L (ref 135–145)
Total Bilirubin: 0.3 mg/dL (ref 0.3–1.2)
Total CHOL/HDL Ratio: 4.7
Total Protein: 7.8 g/dL (ref 6.0–8.3)
Triglycerides: 165 mg/dL — ABNORMAL HIGH (ref <150)
VLDL: 33 mg/dL (ref 0–40)
WBC: 5.2 10*3/uL (ref 4.0–10.5)

## 2009-08-26 ENCOUNTER — Ambulatory Visit: Payer: Self-pay | Admitting: Family Medicine

## 2009-08-26 DIAGNOSIS — E785 Hyperlipidemia, unspecified: Secondary | ICD-10-CM | POA: Insufficient documentation

## 2009-08-26 LAB — CONVERTED CEMR LAB: Pap Smear: NEGATIVE

## 2009-10-01 ENCOUNTER — Encounter: Payer: Self-pay | Admitting: *Deleted

## 2009-10-01 ENCOUNTER — Ambulatory Visit: Payer: Self-pay | Admitting: Family Medicine

## 2009-11-10 ENCOUNTER — Encounter: Payer: Self-pay | Admitting: Family Medicine

## 2009-11-12 ENCOUNTER — Ambulatory Visit: Payer: Self-pay | Admitting: Family Medicine

## 2009-11-16 ENCOUNTER — Ambulatory Visit: Payer: Self-pay | Admitting: Family Medicine

## 2009-11-16 LAB — CONVERTED CEMR LAB
Bilirubin Urine: NEGATIVE
Blood in Urine, dipstick: NEGATIVE
Glucose, Urine, Semiquant: NEGATIVE
Ketones, urine, test strip: NEGATIVE
Nitrite: NEGATIVE
Protein, U semiquant: NEGATIVE
Specific Gravity, Urine: 1.02
Urobilinogen, UA: 0.2
pH: 5

## 2009-12-25 ENCOUNTER — Emergency Department (HOSPITAL_COMMUNITY): Admission: EM | Admit: 2009-12-25 | Discharge: 2009-12-25 | Payer: Self-pay | Admitting: Emergency Medicine

## 2009-12-30 ENCOUNTER — Encounter: Payer: Self-pay | Admitting: Family Medicine

## 2009-12-31 ENCOUNTER — Ambulatory Visit: Payer: Self-pay | Admitting: Family Medicine

## 2009-12-31 ENCOUNTER — Encounter: Payer: Self-pay | Admitting: Family Medicine

## 2009-12-31 DIAGNOSIS — B3749 Other urogenital candidiasis: Secondary | ICD-10-CM

## 2009-12-31 LAB — CONVERTED CEMR LAB
Chlamydia, DNA Probe: NEGATIVE
GC Probe Amp, Genital: NEGATIVE
Whiff Test: NEGATIVE

## 2010-01-08 ENCOUNTER — Emergency Department (HOSPITAL_COMMUNITY): Admission: EM | Admit: 2010-01-08 | Discharge: 2010-01-08 | Payer: Self-pay | Admitting: Family Medicine

## 2010-02-08 ENCOUNTER — Ambulatory Visit: Payer: Self-pay | Admitting: Family Medicine

## 2010-03-02 ENCOUNTER — Ambulatory Visit: Payer: Self-pay | Admitting: Family Medicine

## 2010-03-11 DIAGNOSIS — A6000 Herpesviral infection of urogenital system, unspecified: Secondary | ICD-10-CM | POA: Insufficient documentation

## 2010-03-22 ENCOUNTER — Telehealth: Payer: Self-pay | Admitting: *Deleted

## 2010-03-31 ENCOUNTER — Encounter: Payer: Self-pay | Admitting: Family Medicine

## 2010-04-01 ENCOUNTER — Emergency Department (HOSPITAL_COMMUNITY)
Admission: EM | Admit: 2010-04-01 | Discharge: 2010-04-01 | Payer: Self-pay | Source: Home / Self Care | Admitting: Emergency Medicine

## 2010-05-08 ENCOUNTER — Emergency Department (HOSPITAL_COMMUNITY)
Admission: EM | Admit: 2010-05-08 | Discharge: 2010-05-08 | Payer: Self-pay | Source: Home / Self Care | Admitting: Family Medicine

## 2010-05-09 ENCOUNTER — Encounter: Payer: Self-pay | Admitting: Obstetrics and Gynecology

## 2010-05-10 ENCOUNTER — Encounter: Payer: Self-pay | Admitting: Family Medicine

## 2010-05-18 NOTE — Letter (Signed)
Summary: Work Excuse  [facility]  [address]   [place], [place] [address]   Phone: [phone]  Fax: [phone]    Today's Date: October 01, 2009  Name of Patient: Regina Sawyer  The above named patient had a medical visit today at: 9:00  am   Please take this into consideration when reviewing the time away from work/school.    Special Instructions:  [ x ] None  [  ] To be off the remainder of today, returning to the normal work / school schedule tomorrow.  [  ] To be off until the next scheduled appointment on ______________________.  [  ] Other ________________________________________________________________ ________________________________________________________________________   Sincerely yours,   Loralee Pacas CMA

## 2010-05-18 NOTE — Assessment & Plan Note (Signed)
Summary: UIT?,df   Vital Signs:  Patient profile:   55 year old female Weight:      148.5 pounds Temp:     99.7 degrees F oral Pulse rate:   100 / minute BP sitting:   133 / 80  (right arm)  Vitals Entered By: Arlyss Repress CMA, (May 15, 2009 1:36 PM) CC: vag itchy d/c x 3 days. increased urination x 1 day Is Patient Diabetic? No Pain Assessment Patient in pain? no        Primary Care Provider:  Ellery Plunk MD  CC:  vag itchy d/c x 3 days. increased urination x 1 day.  History of Present Illness: Patient here for c/o vaginal itching and discharge x 3 days. She denies any foul odor, no h/o STDs per patient, no new sex partners. However, she is not sure about her husbands fidelity and would like to be checked for STDs today including GC/Chlam, HIV, Hep B&C and Syphillis.    She also c/o increased urinary frequency x 1 day. She denies dysuria or fever or flank pain. She has not taken any antibiotics recently.    Her medical problems include HTN, controlled on meds.  Habits & Providers  Alcohol-Tobacco-Diet     Tobacco Status: never  Allergies: No Known Drug Allergies  Review of Systems  The patient denies fever, weight loss, chest pain, syncope, dyspnea on exertion, prolonged cough, abdominal pain, and hematuria.    Physical Exam  General:  Well-developed,well-nourished,in no acute distress; alert,appropriate and cooperative throughout examination Lungs:  Normal respiratory effort, chest expands symmetrically. Lungs are clear to auscultation, no crackles or wheezes. Heart:  Normal rate and regular rhythm. S1 and S2 normal without gallop, murmur, click, rub or other extra sounds. Abdomen:  Bowel sounds positive,abdomen soft and non-tender without masses, organomegaly or hernias noted. Genitalia:  Normal introitus for age, no external lesions, small amount of blood tinged vaginal discharge, mucosa pink and moist, no vaginal or cervical lesions, moderate vaginal  atrophy   Impression & Recommendations:  Problem # 1:  VAGINAL DISCHARGE (ICD-623.5) Wet prep negative. No Rx given. STD testing ordered. Will notify patient with results.    Orders: Wet Prep- [facility] [phone]) GC/Chlamydia-[facility] ([id number]) Hep C Ab-[facility] [phone]) Hep Bs Ag-[facility] ([id number]) RPR-[facility] ([id number]) [facility]- Est Level  3 (33295)  Problem # 2:  URINARY FREQUENCY (ICD-788.41) UA negative for infection.  Trace blood likely from vagina.  Glucose fasting 82 last year.  If symptoms persist, consider repeating glucose as screen for DM2.  Orders: Urinalysis-[facility] (00000) [facility]- Est Level  3 (18841)  Complete Medication List: 1)  Omeprazole 20 Mg Cpdr (Omeprazole) .... One by mouth daily 2)  Blood Pressure Monitor and Cuff  .... Please provide on automatic digital bp reader and cuff. patient has htn. icd 9 code 401.1 3)  Ventolin Hfa 108 (90 Base) Mcg/act Aers (Albuterol sulfate) .... 2 puffs inhaled every 4 hours as needed for cough and shortness of breath 4)  Premarin 0.625 Mg/gm Crea (Estrogens, conjugated) .... Apply 2-4 gm daily to vagina and then reduce gradually. dispense 1 large tube 5)  Mirtazapine 30 Mg Tabs (Mirtazapine) .... Take one daily at bedtime 6)  Hydrochlorothiazide 25 Mg Tabs (Hydrochlorothiazide) .... Take one by mouth daily 7)  Ibuprofen 600 Mg Tabs (Ibuprofen) .... One by mouth three times a day with food  Other Orders: HIV-[facility] ([id number])  Laboratory Results   Urine Tests  Date/Time Received: May 15, 2009 2:08 PM  Date/Time Reported: May 15, 2009  2:35 PM   Routine Urinalysis   Color: yellow Appearance: Clear Glucose: negative   (Normal Range: Negative) Bilirubin: negative   (Normal Range: Negative) Ketone: negative   (Normal Range: Negative) Spec. Gravity: 1.010   (Normal Range: 1.003-1.035) Blood: trace-intact   (Normal Range: Negative) pH: 6.0   (Normal Range: 5.0-8.0) Protein: negative   (Normal Range: Negative) Urobilinogen: 0.2    (Normal Range: 0-1) Nitrite: negative   (Normal Range: Negative) Leukocyte Esterace: negative   (Normal Range: Negative)  Urine Microscopic WBC/HPF: 0-3 RBC/HPF: 0-3 Bacteria/HPF: 1+ Mucous/HPF: trace Epithelial/HPF: 0-3 Casts/LPF: 0-3 hyaline cast    Comments: ...........test performed by...........Marland KitchenTerese Door, CMA  Date/Time Received: May 15, 2009 2:08 PM   [employer] Source: vaginal WBC/hpf: 5-10 Bacteria/hpf: 3+  Rods Clue cells/hpf: none  Negative whiff Yeast/hpf: none Trichomonas/hpf: none Comments: 1-5 RBC's present  ...........test performed by...........Marland KitchenTerese Door, CMA

## 2010-05-18 NOTE — Letter (Signed)
Summary: Generic Letter  [facility]  [address]   [place], [place] [address]   Phone: [phone]  Fax: [phone]    05/18/2009  Regina Sawyer [address] [place], [place]  [address]  Dear Ms. Roney Marion,    I am writing to let you know that all of the blood work you had done at the clinic was negative.  You were checked for HIV, Chlamydia, Gonorrhea, Hepatitis B and C.  Please call the clinic if you have any questions.       Sincerely,   Ellery Plunk MD  Appended Document: Generic Letter mailed letter to pt

## 2010-05-18 NOTE — Assessment & Plan Note (Signed)
Summary: r hand pain/Christoval/speigal   Vital Signs:  Patient profile:   55 year old female Weight:      144 pounds Temp:     97.4 degrees F oral Pulse rate:   74 / minute Pulse rhythm:   regular BP sitting:   143 / 83  (left arm)  Vitals Entered By: Loralee Pacas CMA (February 12, 2009 9:55 AM) CC: right hand pain x 1 week Is Patient Diabetic? No   Primary Care Provider:  Ellery Plunk MD  CC:  right hand pain x 1 week.  History of Present Illness: 55 y/o AAF with  1. 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  2. HTN: Rx HCTZ, slightly elevated today and at last visit.   Habits & Providers  Alcohol-Tobacco-Diet     Tobacco Status: never     Tobacco Counseling: not indicated; no tobacco use  Current Medications (verified): 1)  Omeprazole 20 Mg Cpdr (Omeprazole) .... One By Mouth Daily 2)  Blood Pressure Monitor and Cuff .... Please Provide On Automatic Digital Bp Reader and Cuff. Patient Has Htn. Icd 9 Code 401.1 3)  Ventolin Hfa 108 (90 Base) Mcg/act Aers (Albuterol Sulfate) .... 2 Puffs Inhaled Every 4 Hours As Needed For Cough and Shortness of Breath 4)  Premarin 0.625 Mg/gm Crea (Estrogens, Conjugated) .... Apply 2-4 Gm Daily To Vagina and Then Reduce Gradually. Dispense 1 Large Tube 5)  Mirtazapine 15 Mg Tabs (Mirtazapine) .... Take One By Mouth At Bedtime 6)  Hydrochlorothiazide 25 Mg Tabs (Hydrochlorothiazide) .... Take One By Mouth Daily 7)  Ibuprofen 600 Mg  Tabs (Ibuprofen) .... One By Mouth Three Times A Day With Food  Allergies (verified): No Known  Drug Allergies PMH-FH-SH reviewed for relevance  Review of Systems General:  Denies chills and fever. CV:  Denies chest pain or discomfort, shortness of breath with exertion, swelling of feet, and swelling of hands. MS:  Complains of loss of strength; denies joint pain, joint redness, and joint swelling. Derm:  Denies changes in color of skin, lesion(s), and rash. Neuro:  Complains of numbness and tingling.  Physical Exam  General:  Well-developed,well-nourished,in no acute distress; alert,appropriate and cooperative throughout examination.  Vitals reviewed. Lungs:  Normal respiratory effort, chest expands symmetrically. Lungs are clear to auscultation, no crackles or wheezes. Heart:  Normal rate and regular rhythm. S1 and S2 normal without gallop, murmur, click, rub or other extra sounds. Msk:  Right hand: no edema, no skin changes, FROM, no joint tenderness, normal strength, + Tinel test, decreased sensation along median distribution. 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 Rx wrist splint at night and during work, Ibuprofen for pain and inflammation. Demonstrated stretching exercises. Follow up in 2 weeks if not improved for corticosteroid injection.  Orders: [facility]- Est Level  3 (69629)  Problem # 2:  HYPERTENSION, BENIGN ESSENTIAL (ICD-401.1) Assessment: Unchanged  Patient  instructed to follow up with PCP. No red flags. Her updated medication list for this problem includes:    Hydrochlorothiazide 25 Mg Tabs (Hydrochlorothiazide) .Marland Kitchen... Take one by mouth daily  Orders: [facility]- Est Level  3 (03500)  Complete Medication List: 1)  Omeprazole 20 Mg Cpdr (Omeprazole) .... One by mouth daily 2)  Blood Pressure Monitor and Cuff  .... Please provide on automatic digital bp reader and cuff. patient  has htn. icd 9 code 401.1 3)  Ventolin Hfa 108 (90 Base) Mcg/act Aers (Albuterol sulfate) .... 2 puffs inhaled every 4 hours as needed for cough and shortness of breath 4)  Premarin 0.625 Mg/gm Crea (Estrogens, conjugated) .... Apply 2-4 gm daily to vagina and then reduce gradually. dispense 1 large tube 5)  Mirtazapine 15 Mg Tabs (Mirtazapine) .... Take one by mouth at bedtime 6)  Hydrochlorothiazide 25 Mg Tabs (Hydrochlorothiazide) .... Take one by mouth daily 7)  Ibuprofen 600 Mg Tabs (Ibuprofen) .... One by mouth three times a day with food  Patient Instructions: 1)  It was nice to meet you today. It looks like you have carpal tunnel. Carpal tunnel syndrome is caused by pressure on the median nerve. This is a nerve that goes through your wrist. The median nerve can become pinched at the carpal tunnel, which is a small canal, or space, near the base of the palm of your hand. 2)  When you have carpal tunnel syndrome, some or all of your fingers may feel numb. You may have a "pins and needles" feeling in your hand, as if your hand is asleep. Your hand may hurt.  3)  To Treat: Continue wearing the wrist splint at night and while working. If you can use your left hand, do so. Continue the stretching exercises that I showed you (3 times a day). Take the Ibuprofen for pain (make sure to take this with food).  4)  If your pain is not improving (or is getting worse) in 1-2 weeks, come back and we will inject a steriod into the area for pain relief. Prescriptions: IBUPROFEN 600 MG  TABS (IBUPROFEN) one by mouth three times a day WITH FOOD  #90 x 0   Entered and Authorized by:   Helane Rima DO   Signed by:   Helane Rima DO on 02/12/2009   Method used:   Electronically to        [facility]  [address] [phone]* (retail)       [address]       [place], [place]  [address]       Ph: [phone] or [phone]       Fax: [phone]   RxID:   [id number]   Prevention &  Chronic Care Immunizations   Influenza vaccine: Fluvax MCR  (02/06/2008)   Influenza vaccine due: 02/05/2009    Tetanus booster: 11/20/2002: Done.   Tetanus booster due: 11/19/2012    Pneumococcal vaccine: Not documented  Colorectal Screening   Hemoccult: Not documented   Hemoccult due: Not Indicated    Colonoscopy: normal  (04/01/2008)   Colonoscopy due: 04/01/2018  Other Screening   Pap smear: normal  (11/23/2007)   Pap smear due: 11/22/2008    Mammogram: Done.  (11/21/2004)   Mammogram due: 11/21/2005   Smoking status: never  (02/12/2009)  Lipids   Total Cholesterol: 198  (11/23/2007)   LDL: 125  (11/23/2007)   LDL Direct: Not documented   HDL: 58  (11/23/2007)  Triglycerides: 74  (11/23/2007)  Hypertension   Last Blood Pressure: 143 / 83  (02/12/2009)   Serum creatinine: 0.99  (05/08/2008)   Serum potassium 4.3  (05/08/2008)    Hypertension flowsheet reviewed?: Yes   Progress toward BP goal: Unchanged  Self-Management Support :   Personal Goals (by the next clinic visit) :      Personal blood pressure goal: 140/90  (02/12/2009)   Patient will work on the following items until the next clinic visit to reach self-care goals:     Medications and monitoring: take my medicines every day  (02/12/2009)     Eating: drink diet soda or water instead of juice or soda, eat more vegetables, use fresh or frozen vegetables, eat foods that are low in salt, eat baked foods instead of fried foods, eat fruit for snacks and desserts  (02/12/2009)     Activity: take a 30 minute walk every day  (02/12/2009)    Hypertension self-management support: Written self-care plan  (02/12/2009)   Hypertension self-care plan printed.

## 2010-05-18 NOTE — Assessment & Plan Note (Signed)
Summary: nutr. appt/eo   Vital Signs:  Patient profile:   55 year old female Height:      63 inches Weight:      152.3 pounds BMI:     27.08  Vitals Entered By: Wyona Almas PHD (March 02, 2010 4:01 PM)  Primary Care Provider:  Ellery Plunk MD   History of Present Illness: Assessment:  Spent 30 min w/ pt.  Ms. Ketter has recently had surgery on her R hand for an injury that happened at work.  She said this has affected her follow-through w/ diet and ex recommendations.  24-hr recall suggests intake of  ~2600 kcal: B (7 AM)- 1/2 c Lucky Charms w/ 1/4 c whole milk; Snk (10 AM)- 1/2 pc lasagna, swt tea; Snk (1:30 PM)- popsicle; L (4 PM)- TV dinner: 2 fried chx legs, corn, 1/4 c choc pudding, 8 oz ginger ale; Snk (5 PM)- 2 tangerines and grapes; D (6 PM)- fried chx wing and thigh, 1 c rice w/ 1/2 c gravy, roll, 8 oz soda.  Usually eating out 2-3 X wk.  Ms. Pawlicki did not record veg intake of walking b/c her R hand has been out of use until this week.    Nutrition Diagnosis:  FDid nota ssess food- and nutrition-related knowledge deficit (NB-1.1) related to all aspects of nutrition today.  No progress on inappropriate intake of types of carbohydrate (NI-53.3) related to lack of fruits and veg's as evidenced by pt self-report of veg's on most days of the week.  No progress on excessive mineral intake (sodium) (NI-55.2) related to restaurant and convenience foods as evidenced by continued used of restaurant and convenience foods such as frozen meals.      Intervention: See Patient Instructions.    Monitoring/Eval:  Dietary intake, body weight, and exercise at January F/U.      Allergies: No Known Drug Allergies   Complete Medication List: 1)  Omeprazole 20 Mg Cpdr (Omeprazole) .... One by mouth daily 2)  Blood Pressure Monitor and Cuff  .... Please provide on automatic digital bp reader and cuff. patient has htn. icd 9 code 401.1 3)  Premarin 0.625 Mg/gm Crea (Estrogens, conjugated) ....  Apply 2-4 gm daily to vagina and then reduce gradually. dispense 1 large tube 4)  Mirtazapine 45 Mg Tabs (Mirtazapine) .... Take one at bedtime 5)  Hydrochlorothiazide 25 Mg Tabs (Hydrochlorothiazide) .... Take one by mouth daily 6)  Ketoconazole 2 % Crea (Ketoconazole) .... Apply to area two times a day, 30 gm 7)  Fluconazole 100 Mg Tabs (Fluconazole) .... Take 1 tab by mouth now then 1 in 4 days  Other Orders: Reassessment Each 15 min unit- [facility] ([id number])  Patient Instructions: 1)  GOAL:  Vegetables at least 1 X day.   2)  GOAL:  Fruit at least once a day.   3)  GOAL: Walk at least 30 min 3 X wk.   4)  Record vruits and veg's intake and # minutes walked each day, and BRING to your follow-up nutrition appt in January.     Orders Added: 1)  Reassessment Each 15 min unit- [facility] [[id number]]

## 2010-05-18 NOTE — Assessment & Plan Note (Signed)
Summary: NUTRI APPT PER SPIEGEL/KH   Vital Signs:  Patient profile:   55 year old female Height:      63 inches Weight:      149.2 pounds BMI:     26.53 BP sitting:   136 / 87  (right arm)  Vitals Entered By: Wyona Almas PHD (October 01, 2009 10:24 AM)  Primary Care Provider:  Ellery Plunk MD   History of Present Illness: Assessment:  Spent 60 minutes with pt.  Ms. Nissan came with her husband today, who has DM.  There is no structured meal pattern in the household; Haadiya & her husband just eat what they want when they want it, usually 1-2 meals & various snacks.  Restaurant or takeout meals are 4-5 X wk.  Average daily intake of 2-3 c Kool-Aid & 2-3 c soda, & candy "all day long."  Impossible to estimate kcal intake from 24-hr recalll b/c of ambiguity of frequency & quantities: B- none (water with med's); Snks- soda 8-9 X,  ~3 peppermints; D (4 PM)- 7 fried chx wings, 4-5 c hash browns in veg oil, 1 freeze pop, 24 oz c soda.  Rielle's ability to give an accurate history is questionable, i.e., several inconsistencies in her reporting today about both food intake & medications.  At one point she said she rarely takes her HCTZ, then contradicted herself later, saying she takes it regularly.  Of note is Dayna's assertion that she wants to gain weight.  We discussed why it would be better if she even takes a bit of weight off rather than gain, & I pointed out that she has in fact gained  ~20 lb since late-2008.  Significant obstacles to change for Ms. Penton are (1) her understanding of food & nutrition; (2) ill-fitting dentures, which she avoids wearing; & (3) finances.  Nutrition Diagnosis:  Food- and nutrition-related knowledge deficit (NB-1.1) related to all aspects of nutrition as evidenced by patient's avoidance of low-fat yogurt b/c she assumed if she eats it, it will make her lose weight.  Inappropriate intake of types of carbohydrate (NI-53.3) related to lack of fruits and veg's as evidenced  by no fruit or veg's consumed yesterday, and reportedly seldom consumed.  Excessive mineral intake (sodium) (NI-55.2) related to restaurant and convenience foods as evidenced by yesterday's intake of pre-prepared fried chx and hash browns as her only meal.    Intervention: See Patient Instructions.    Monitoring/Eval:  Dietary intake, body weight, and exercise at 4-wk F/U.    Allergies: No Known Drug Allergies   Complete Medication List: 1)  Omeprazole 20 Mg Cpdr (Omeprazole) .... One by mouth daily 2)  Blood Pressure Monitor and Cuff  .... Please provide on automatic digital bp reader and cuff. patient has htn. icd 9 code 401.1 3)  Ventolin Hfa 108 (90 Base) Mcg/act Aers (Albuterol sulfate) .... 2 puffs inhaled every 4 hours as needed for cough and shortness of breath 4)  Premarin 0.625 Mg/gm Crea (Estrogens, conjugated) .... Apply 2-4 gm daily to vagina and then reduce gradually. dispense 1 large tube 5)  Mirtazapine 45 Mg Tabs (Mirtazapine) .... Take one at bedtime 6)  Hydrochlorothiazide 25 Mg Tabs (Hydrochlorothiazide) .... Take one by mouth daily 7)  Ibuprofen 600 Mg Tabs (Ibuprofen) .... One by mouth three times a day with food  Other Orders: Inital Assessment Each - [facility] [phone])  Patient Instructions: 1)  TAKE YOUR BLOOD PRESSURE  MEDICINE, HCTZ, EVERY DAY.   2)  No more  than 5 hours between eating. Eat at least 3 meals and 1-2 snacks per day.  3)  For each lunch and dinner meal, include at least these 3 components:  fruit or vegetables, starch, and protein.   4)  STARCH:  RICE, POTATOES, PASTA, CORN, BREAD, CRACKERS, BAKED GOODS.  5)  PROTEIN:  MEAT, FISH, POULTRY, DAIRY FOODS, EGGS, BEANS.   6)  PLAN AHEAD FOR MEALS AND SNACKS.   7)  DECREASE SODA AND kOOL-AID INTAKE AND INCREASE WATER EVERY DAY.   8)  Try to learn to like a wider variety of vegetables.  Leafy greens are especially good for you.  I encourage you to use your microwave to cook vegetables:  Place in a bowl  with little or no added water, cover with a plate, and cook on high for 1-3 minutes.  9)  Please schedule a follow-up nutrition appointment in July.  AT FOLLOW-UP, WE'LL TALK MORE ABOUT FATS IN YOUR DIET.

## 2010-05-18 NOTE — Progress Notes (Signed)
Summary: Hand XRay from [facility] on 02/05/09   DG Hand *R* - STATUS: Final  IMAGE                                     Perform Date: 21Oct10 16:07  Ordered By: Ernie Hew Date: 21Oct10 16:03  Facility: [facility]                              Department: DG  Service Report Text  [facility] Accession Number: [id number]    Clinical Data: Hand pain.  No injury.    RIGHT HAND - COMPLETE 3+ VIEW    Comparison: None.    Findings: Negative for fracture.  There is no arthropathy or   degenerative change.    IMPRESSION:   Negative    Read By:  Camelia Phenes,  M.D.   Released By:  Camelia Phenes,  M.D.  Additional Information  HL7 RESULT STATUS : F  External image : [id number]  External IF Update Timestamp : 2009-02-05:16:15:18.000000

## 2010-05-18 NOTE — Miscellaneous (Signed)
Summary: walk in  Clinical Lists Changes walked in with spouse carrying a 2L bottle of root beer. states she thinks she was poisoned by it. bought it yesterday & took a sip. states it tastes bad & she spit it out. has occasional stomach pain. had 2 stools this am. no nausea or vomiting. They were very concerned that there were a lot of bubbles at the top of the bottle. I explained that this was the CO2. they insisted it should be flat. told them if you leave the cap off, it will go flat. I opened it & fluid went everywhere. many fewer bubbles now. gave her the symptoms of food poisoning but told her it was unlikely since she took a sip & spit it out immediately. to call if she has symptoms & needs appt. she then asked about her refill on BP meds. per pcp she needs to be seen before any more refills, put her in work in schedule for now & she is aware there may be a wait. I gave her a copy of the letter written 06/23/09 by Dr. Deirdre Priest. states "they lie" "I have not missed any apts" I showed her the appts she has not come to. asked that she call 24 hrs in advance in the future. spouse asked if he had any no shows. I reviewed his & he has many as well/ he has an appt coming on the 15th. urged him to keep it or call 24 hrs in advance if unable to keep. stated he will.[address] [address] RN  June 24, 2009 2:50 PM

## 2010-05-18 NOTE — Assessment & Plan Note (Signed)
Summary: SYKES/NUTRI/KH   Vital Signs:  Patient profile:   55 year old female Height:      63 inches Weight:      154.0 pounds BP supine:   160 / 91  Vitals Entered By: Wyona Almas PHD (November 12, 2009 1:43 PM)  Primary Care Provider:  Ellery Plunk MD   History of Present Illness: Pt comes in today for her 2nd visit in [facility]. She has gained 5 lbs since the last appointment in June, 2011. Her blood pressure is elevated today.  24-hr recall suggests intake of  kcal: B none (AM)- ; Snk none  (AM)- ; L 10 am: [employer], chicken 3 wings, 2 peices breaded and fried fish,  corn 1/2 cup , creamed potatoes 1/2 cup, biscuit x 1, OJ 16 oz, orange/passion 16 oz  (PM)- ; Snk pingles 10 chips, water  (PM)- ; [employer] fried chicken drumstick and thigh, 1 biscuit, white out soda (16 oz can)  (PM)- Snk slim jim 2 supersized sticks(PM)-   Drinks sips of soda every 2-3 minutes (ginger ale or orange soda) (3 16oz bottles worth).  Bubble gum duing the day.  No other candy yesterday.   Fat: pt was able to identify chicken, fried fish, corn with oil floating on top.  Veggies: pt has not tried getting any frozen veggies Fruits: Orange, pineapple, pears  Pt is not exercising regularly. She says the people are "mean" (unsafe environment) around her and she doesn't want to exercise around her house and she spends 2-3 days at ther mom's house helping to take care of her.    I spent more than 45 minutes of face to face time counseling this patient.   Allergies: No Known Drug Allergies   Complete Medication List: 1)  Omeprazole 20 Mg Cpdr (Omeprazole) .... One by mouth daily 2)  Blood Pressure Monitor and Cuff  .... Please provide on automatic digital bp reader and cuff. patient has htn. icd 9 code 401.1 3)  Ventolin Hfa 108 (90 Base) Mcg/act Aers (Albuterol sulfate) .... 2 puffs inhaled every 4 hours as needed for cough and shortness of breath 4)  Premarin 0.625 Mg/gm Crea (Estrogens,  conjugated) .... Apply 2-4 gm daily to vagina and then reduce gradually. dispense 1 large tube 5)  Mirtazapine 45 Mg Tabs (Mirtazapine) .... Take one at bedtime 6)  Hydrochlorothiazide 25 Mg Tabs (Hydrochlorothiazide) .... Take one by mouth daily 7)  Ibuprofen 600 Mg Tabs (Ibuprofen) .... One by mouth three times a day with food  Other Orders: [facility]- Est  Level 4 (04540)  Patient Instructions: 1)  Add some frozen veggies to your diet (carrots and peas). Add these to your dinner meat tonight and everynight for dinner.  2)  Try to get 1 veggie and/or 1 fruit to each meal.  3)  No root beer or coke after 2 pm (no caffine) so that you don't have to pee all night.  4)  Drink more water every day. Try to drink 8 glasses of water a day.  5)  Use the tap water and let it set out for about 1 hour or overnight to get rid of the choline taste.  6)  Start walking on the trails at the park at least 3-4 times a week. Try to do 30 minutes every time. Go to the [facility] tomorrow or this weekend.

## 2010-05-18 NOTE — Miscellaneous (Signed)
Summary: vag itch & back pain  Clinical Lists Changes c/o vag itch & back pain. unable to come in until tomorrow pm. will see Dr. Katrinka Blazing.[place] Circle RN  December 30, 2009 2:50 PM

## 2010-05-18 NOTE — Assessment & Plan Note (Signed)
Summary: vag itch & back pain/[place]/spiegel   Vital Signs:  Patient profile:   55 year old female Height:      63 inches Weight:      154.7 pounds BMI:     27.50 Temp:     98.4 degrees F oral Pulse rate:   106 / minute BP sitting:   144 / 84  (left arm) Cuff size:   regular  Vitals Entered By: Garen Grams LPN (December 31, 2009 3:46 PM) CC: ? yeast infection Is Patient Diabetic? No Pain Assessment Patient in pain? yes     Location: back   Primary Care Provider:  Ellery Plunk MD  CC:  ? yeast infection.  History of Present Illness: Pt states she is having vaginal discharge white in color foul smelling no blood, no pain with urination no increase in urinary urgency or hesitency, only worried about vaginal discharge  Pt states she is having some back pain wors ewith certain movement feels she may have strained it at work but doen't know denies fever, chills, nausea, vomiting, diarrhea or constipation h of kidney stones, hurts lower back b/l.  Has not change ADL's from the pain  Habits & Providers  Alcohol-Tobacco-Diet     Tobacco Status: never     Tobacco Counseling: not indicated; no tobacco use     Passive Smoke Exposure: yes     Diet Comments: eats fried foods     Diet Counseling: to improve diet; diet is suboptimal  Current Medications (verified): 1)  Omeprazole 20 Mg Cpdr (Omeprazole) .... One By Mouth Daily 2)  Blood Pressure Monitor and Cuff .... Please Provide On Automatic Digital Bp Reader and Cuff. Patient Has Htn. Icd 9 Code 401.1 3)  Premarin 0.625 Mg/gm Crea (Estrogens, Conjugated) .... Apply 2-4 Gm Daily To Vagina and Then Reduce Gradually. Dispense 1 Large Tube 4)  Mirtazapine 45 Mg Tabs (Mirtazapine) .... Take One At Bedtime 5)  Hydrochlorothiazide 25 Mg Tabs (Hydrochlorothiazide) .... Take One By Mouth Daily 6)  Ketoconazole 2 % Crea (Ketoconazole) .... Apply To Area Two Times A Day, 30 Gm 7)  Fluconazole 100 Mg Tabs (Fluconazole) .... Take 1 Tab By  Mouth Now Then 1 in 4 Days 8)  Tramadol Hcl 50 Mg Tabs (Tramadol Hcl) .... Take 1 Tab By Mouth Two Times A Day For Back Pain As Needed  Allergies (verified): No Known Drug Allergies  Review of Systems       see hpi  Physical Exam  General:  Well-developed,well-nourished,in no acute distress; alert,appropriate and cooperative throughout examination Eyes:  No corneal or conjunctival inflammation noted. EOMI. Perrla.  Mouth:  post nasal drip.  oropharynx pink and moist.   Lungs:  Normal respiratory effort, chest expands symmetrically. Lungs are clear to auscultation, no crackles or wheezes. Heart:  RRR no murmur Abdomen:  Bowel sounds positive,abdomen soft and non-tender without masses, organomegaly or hernias noted. Genitalia:  Pelvic Exam:        External: normal female genitalia without lesions or masses        Vagina: normal without lesions or masses but lots of white discharge and colonization        Cervix: normal without lesions or masses, NT some discharge from the cervix        Adnexa: normal bimanual exam without masses or fullness        Uterus: normal by palpation           Impression & Recommendations:  Problem # 1:  CANDIDIASIS  OF OTHER UROGENITAL SITES (ICD-112.2) topical did not seem to work with treat with diflucan no trichor BV seen this time so will hold on flagyl, no cervical tenderness so not PId and no fever, will get GC and chlamydia aas well.  Orders: GC/Chlamydia-[facility] (87591/87491) Wet Prep- [facility] ([address]) [facility]- Est  Level 4 (60454)  Problem # 2:  BACK PAIN (ICD-724.5) seems MSk, will try tramadol, feels pt could possibly abuse narcotics so would stay away from that if possible, husband was with pt and seemed to be looking for possible narcotics or could have been nothing. did not feel x-rays were necessary, if not better in week have pt come back.  Pt was able to stand up and walk out without any problems.  Her updated medication list for this problem  includes:    Tramadol Hcl 50 Mg Tabs (Tramadol hcl) .Marland Kitchen... Take 1 tab by mouth two times a day for back pain as needed  Orders: [facility]- Est  Level 4 ([address]11)  Complete Medication List: 1)  Omeprazole 20 Mg Cpdr (Omeprazole) .... One by mouth daily 2)  Blood Pressure Monitor and Cuff  .... Please provide on automatic digital bp reader and cuff. patient has htn. icd 9 code 401.1 3)  Premarin 0.625 Mg/gm Crea (Estrogens, conjugated) .... Apply 2-4 gm daily to vagina and then reduce gradually. dispense 1 large tube 4)  Mirtazapine 45 Mg Tabs (Mirtazapine) .... Take one at bedtime 5)  Hydrochlorothiazide 25 Mg Tabs (Hydrochlorothiazide) .... Take one by mouth daily 6)  Ketoconazole 2 % Crea (Ketoconazole) .... Apply to area two times a day, 30 gm 7)  Fluconazole 100 Mg Tabs (Fluconazole) .... Take 1 tab by mouth now then 1 in 4 days 8)  Tramadol Hcl 50 Mg Tabs (Tramadol hcl) .... Take 1 tab by mouth two times a day for back pain as needed  Patient Instructions: 1)  Nice to meet you 2)  Seems to be a yeast infection  3)  will give you a pill take 1 pill today and then one in 4 days.  4)  If not better in 1-2 weeks please come back Prescriptions: TRAMADOL HCL 50 MG TABS (TRAMADOL HCL) take 1 tab by mouth two times a day for back pain as needed  #14 x 0   Entered and Authorized by:   Antoine Primas DO   Signed by:   Antoine Primas DO on 12/31/2009   Method used:   Electronically to        [facility] [facility] [address] [phone]* (retail)       [address]       [place], [place]  [address]       Ph: [phone] or [phone]       Fax: [phone]   RxID:   [id number] FLUCONAZOLE 100 MG TABS (FLUCONAZOLE) take 1 tab by mouth now then 1 in 4 days  #2 x 1   Entered and Authorized by:   Antoine Primas DO   Signed by:   Antoine Primas DO on 12/31/2009   Method used:   [facility]ronically to        [facility] [address] [phone]* (retail)       [address]       [place], [place]  [address]       Ph: [phone] or [phone]       Fax: [phone]  RxID:   [id number]   Laboratory Results  Date/Time Received: December 31, 2009 4:10 PM  Date/Time Reported: December 31, 2009 4:15 PM   [employer] Source: Vaginal WBC/hpf: 1-5 Bacteria/hpf: 3+  Rods Clue cells/hpf: none  Negative whiff Yeast/hpf: rare Trichomonas/hpf: none Comments: ...........test performed by...........Marland KitchenTerese Door, CMA

## 2010-05-18 NOTE — Assessment & Plan Note (Signed)
Summary: nutr. appt/eo   Allergies: No Known Drug Allergies   Complete Medication List: 1)  Omeprazole 20 Mg Cpdr (Omeprazole) .... One by mouth daily 2)  Blood Pressure Monitor and Cuff  .... Please provide on automatic digital bp reader and cuff. patient has htn. icd 9 code 401.1 3)  Ventolin Hfa 108 (90 Base) Mcg/act Aers (Albuterol sulfate) .... 2 puffs inhaled every 4 hours as needed for cough and shortness of breath 4)  Premarin 0.625 Mg/gm Crea (Estrogens, conjugated) .... Apply 2-4 gm daily to vagina and then reduce gradually. dispense 1 large tube 5)  Mirtazapine 45 Mg Tabs (Mirtazapine) .... Take one at bedtime 6)  Hydrochlorothiazide 25 Mg Tabs (Hydrochlorothiazide) .... Take one by mouth daily 7)  Ibuprofen 600 Mg Tabs (Ibuprofen) .... One by mouth three times a day with food  Other Orders: No Charge Patient Arrived (NCPA0) (NCPA0)

## 2010-05-18 NOTE — Assessment & Plan Note (Signed)
Summary: CPE/KH   Vital Signs:  Patient profile:   55 year old female Height:      63 inches Weight:      152.1 pounds BMI:     27.04 Temp:     98.9 degrees F oral Pulse rate:   109 / minute BP sitting:   133 / 82  (left arm) Cuff size:   regular  Vitals Entered By: Garen Grams LPN (Aug 26, 2009 3:05 PM) CC: cpe Is Patient Diabetic? No Pain Assessment Patient in pain? no        Primary Care Provider:  Ellery Plunk MD  CC:  cpe.  History of Present Illness: pt here today for WWE  last period was 2 or 3 years ago.  Still having hot flashes, but no vaginal dryness no itch or d/c.  No pain during sex but complaining of a lack of desire.  Habits & Providers  Alcohol-Tobacco-Diet     Tobacco Status: never     Diet Comments: eats fried foods     Diet Counseling: to improve diet; diet is suboptimal  Exercise-Depression-Behavior     Does Patient Exercise: no     Exercise Counseling: to improve exercise regimen  Current Medications (verified): 1)  Omeprazole 20 Mg Cpdr (Omeprazole) .... One By Mouth Daily 2)  Blood Pressure Monitor and Cuff .... Please Provide On Automatic Digital Bp Reader and Cuff. Patient Has Htn. Icd 9 Code 401.1 3)  Ventolin Hfa 108 (90 Base) Mcg/act Aers (Albuterol Sulfate) .... 2 Puffs Inhaled Every 4 Hours As Needed For Cough and Shortness of Breath 4)  Premarin 0.625 Mg/gm Crea (Estrogens, Conjugated) .... Apply 2-4 Gm Daily To Vagina and Then Reduce Gradually. Dispense 1 Large Tube 5)  Mirtazapine 45 Mg Tabs (Mirtazapine) .... Take One At Bedtime 6)  Hydrochlorothiazide 25 Mg Tabs (Hydrochlorothiazide) .... Take One By Mouth Daily 7)  Ibuprofen 600 Mg  Tabs (Ibuprofen) .... One By Mouth Three Times A Day With Food  Allergies (verified): No Known Drug Allergies  Social History: Does Patient Exercise:  no  Review of Systems  The patient denies anorexia, fever, weight loss, weight gain, vision loss, decreased hearing, hoarseness, chest  pain, syncope, dyspnea on exertion, peripheral edema, prolonged cough, headaches, hemoptysis, abdominal pain, melena, hematochezia, severe indigestion/heartburn, hematuria, incontinence, genital sores, muscle weakness, suspicious skin lesions, transient blindness, difficulty walking, depression, unusual weight change, abnormal bleeding, enlarged lymph nodes, angioedema, breast masses, and testicular masses.    Physical Exam  General:  Well-developed,well-nourished,in no acute distress; alert,appropriate and cooperative throughout examination Head:  Normocephalic and atraumatic without obvious abnormalities. No apparent alopecia or balding. Eyes:  No corneal or conjunctival inflammation noted. EOMI. Perrla. Funduscopic exam benign, without hemorrhages, exudates or papilledema. Vision grossly normal. Ears:  External ear exam shows no significant lesions or deformities.  Otoscopic examination reveals clear canals, tympanic membranes are intact bilaterally without bulging, retraction, inflammation or discharge. Hearing is grossly normal bilaterally. Neck:  No deformities, masses, or tenderness noted. Lungs:  Normal respiratory effort, chest expands symmetrically. Lungs are clear to auscultation, no crackles or wheezes. Heart:  Normal rate and regular rhythm. S1 and S2 normal without gallop, murmur, click, rub or other extra sounds. Abdomen:  Bowel sounds positive,abdomen soft and non-tender without masses, organomegaly or hernias noted. Genitalia:  Pelvic Exam:        External: normal female genitalia without lesions or masses        Vagina: normal without lesions or masses  Cervix: normal without lesions or masses        Adnexa: normal bimanual exam without masses or fullness        Uterus: normal by palpation        Pap smear: performed Msk:  No deformity or scoliosis noted of thoracic or lumbar spine.   Neurologic:  No cranial nerve deficits noted. Station and gait are normal. Plantar  reflexes are down-going bilaterally. DTRs are symmetrical throughout. Sensory, motor and coordinative functions appear intact.   Impression & Recommendations:  Problem # 1:  HEALTH MAINTENANCE EXAM (ICD-V70.0) Assessment Unchanged BP ok today.  WIll monitor during future appts.  Checked a pap today.  No STD check as no symptoms or pt request.  Discussed HL today as well.  WIll try diet and exercise for 3 months and recheck.  discussed possiblity of statin therapy.  return in 3months.   Orders: [facility] - Est  40-64 yrs ([address])  Complete Medication List: 1)  Omeprazole 20 Mg Cpdr (Omeprazole) .... One by mouth daily 2)  Blood Pressure Monitor and Cuff  .... Please provide on automatic digital bp reader and cuff. patient has htn. icd 9 code 401.1 3)  Ventolin Hfa 108 (90 Base) Mcg/act Aers (Albuterol sulfate) .... 2 puffs inhaled every 4 hours as needed for cough and shortness of breath 4)  Premarin 0.625 Mg/gm Crea (Estrogens, conjugated) .... Apply 2-4 gm daily to vagina and then reduce gradually. dispense 1 large tube 5)  Mirtazapine 45 Mg Tabs (Mirtazapine) .... Take one at bedtime 6)  Hydrochlorothiazide 25 Mg Tabs (Hydrochlorothiazide) .... Take one by mouth daily 7)  Ibuprofen 600 Mg Tabs (Ibuprofen) .... One by mouth three times a day with food  Other Orders: Pap Smear-[facility] ([id number]) Future Orders: Lipid-[facility] ([id number]) ... 08/26/2010 Basic Met-[facility] ([id number]) ... 09/02/2010  Patient Instructions: 1)  please make an appt for Dr. Gerilyn Pilgrim to see you to discuss nutrition to help with your high cholesterol 2)  I want you to walk 30 min per day 3)  come back to see me in 3 months.  get your cholesterol checked before you come in (call and make a lab appt..do not eat before you get your bloodwork done).

## 2010-05-18 NOTE — Letter (Signed)
Summary: Probation Letter  [facility]  [address]   [place], [place] [address]   Phone: [phone]  Fax: [phone]    06/23/2009  [name] [address] [place], [place]  [address]  Dear Ms. DRAWDY,  With the goal of better serving all our patients the [facility] is following each patient's missed appointments.  You have missed at least 3 appointments with our practice.If you cannot keep your appointment, we expect you to call at least 24 hours before your appointment time.  Missing appointments prevents other patients from seeing [place] and makes it difficult to provide you with the best possible medical care.      1.   If you miss one more appointment, we will only give you limited medical services. This means we will not call in medication refills, complete a form, or make a referral for you except when you are here for a scheduled office visit.    2.   If you miss 2 or more appointments in the next year, we will dismiss you from our practice.    Our office staff can be reached at [phone] Monday through Friday from 8:30 a.m.-5:00 p.m. and will be glad to schedule your appointment as necessary.    Thank you.   [facility]  Appended Document: Probation Letter mailed.

## 2010-05-18 NOTE — Progress Notes (Signed)
Summary: needs meds  Phone Note Call from Patient Call back at [phone]   Caller: Patient Summary of Call: pt's mother is hosp and is not expected to live- wants something for her nerves  Initial call taken by: De Nurse,  March 22, 2010 11:39 AM  Follow-up for Phone Call        if pt would like work in appt for anxiety, may come in.  otherwise, advise to seek counseling from minister or other support figure. Follow-up by: Ellery Plunk MD,  March 22, 2010 12:35 PM  Additional Follow-up for Phone Call Additional follow up Details #1::        Left message at number provided above, also calling home number listed in chart but was unable to leave a message. Additional Follow-up by: Garen Grams LPN,  March 22, 2010 2:51 PM    Additional Follow-up for Phone Call Additional follow up Details #2::    Left another message for pt to return call, will await call back from pt. Follow-up by: Garen Grams LPN,  March 23, 2010 12:17 PM

## 2010-05-18 NOTE — Miscellaneous (Signed)
Summary: Re: refill  Clinical Lists Changes   received refill request for hctz from  pharmacy. last time when it was filled a month ago advised patient to schedule appointment before next refill needed. she has not scheduled. message left on voicemail to call back to discuss. will schedule appointment and give her enough to last until appointment time when she calls back. Theresia Lo RN  June 23, 2009 11:01 AM  Pt should technically be on probationhaving DNKA'd 3 appts. I flagged Dr. Deirdre Priest to let him know.  Pt does need to come in.  thanks!  Ellery Plunk MD  June 23, 2009 8:42 PM

## 2010-05-18 NOTE — Assessment & Plan Note (Signed)
Summary: f/u,df   Vital Signs:  Patient profile:   55 year old female Height:      63 inches Weight:      151.6 pounds BMI:     26.95  Vitals Entered By: Wyona Almas PHD (February 08, 2010 4:20 PM)  Primary Care Provider:  Ellery Plunk MD   History of Present Illness: Assessment:  Spent 45 minutes with pt.  Regina Sawyer was again accompanied by her husband.  24-hr recall suggests intake of  ~2000 kcal: B (9 AM)- 2 c rice krispies, 1 c whole milk; Snk (AM)- ; L (12 PM)- 7 baked chx wings, 1 c rice, 1/2 c gravy, 1 c corn & peas, water; Snk (9 PM)- 2 c corn chips w/ 1/2 c salsa.  Intake also included 3 scoops of powdered drink mix, [employer] "Size On" (180 kcal/scoop).  Regina Sawyer has not been checking her BGs lately.  She also has not increased veg intake, started walking, or followed any recommendations given at her last nutrition appt.  She again talked of wanting to gain weight, which I again suggested is not a reasonable goal in light of her health status.  We discussed her resistance to change and the likely consequences of continuing current behaviors.  She did say she would commit to the recommendations made today:  walking and increasing veg's.    Nutrition Diagnosis:  Food- and nutrition-related knowledge deficit (NB-1.1) related to all aspects of nutrition as evidenced by patient's continued interest in gaining weight and current use of [employer] drink as a good water substitute.  Inappropriate intake of types of carbohydrate (NI-53.3) related to lack of fruits and veg's as evidenced by F & V consumed only 2 X wk. Excessive mineral intake (sodium) (NI-55.2) related to restaurant and convenience foods as evidenced by salt added to foods and continued used of restaurant/takeout foods.    Intervention: See Patient Instructions.    Monitoring/Eval:  Dietary intake, body weight, and exercise at 4-wk F/U.     Allergies: No Known Drug Allergies   Complete Medication List: 1)  Omeprazole 20 Mg Cpdr  (Omeprazole) .... One by mouth daily 2)  Blood Pressure Monitor and Cuff  .... Please provide on automatic digital bp reader and cuff. patient has htn. icd 9 code 401.1 3)  Premarin 0.625 Mg/gm Crea (Estrogens, conjugated) .... Apply 2-4 gm daily to vagina and then reduce gradually. dispense 1 large tube 4)  Mirtazapine 45 Mg Tabs (Mirtazapine) .... Take one at bedtime 5)  Hydrochlorothiazide 25 Mg Tabs (Hydrochlorothiazide) .... Take one by mouth daily 6)  Ketoconazole 2 % Crea (Ketoconazole) .... Apply to area two times a day, 30 gm 7)  Fluconazole 100 Mg Tabs (Fluconazole) .... Take 1 tab by mouth now then 1 in 4 days  Other Orders: Reassessment Each 15 min unit- [facility] [phone])  Patient Instructions: 1)  Your Size On contains 37 grams of sugar in each scoop, and if you are consuming 3 scoops per day, that means you have added 81 grams of sugar and almost 600 calories to your diet daily.   2)  Goal:  Aim for at least one serving of vegetables per day.   3)  Exercise goal:  Walk 30 minutes 3 X wk.   4)  Record on your calendar the number of minutes you walk each day AND each time you eat a vegetable serving.  5)  Bring this record to your follow-up appt with me in November.     Orders  Added: 1)  Reassessment Each 15 min unit- [facility] [[address]]     Vital Signs:  Patient Profile:   55 year old female Height:     63 inches (160.02 cm) Weight:      151.6 pounds BMI:     26.95

## 2010-05-18 NOTE — Assessment & Plan Note (Signed)
Summary: HTN   Vital Signs:  Patient profile:   55 year old female Height:      63 inches Weight:      153 pounds BMI:     27.20 BSA:     1.73 Temp:     98.8 degrees F Pulse rate:   96 / minute BP sitting:   127 / 80  Vitals Entered By: Jone Baseman CMA (June 24, 2009 2:47 PM) CC: HTN Is Patient Diabetic? No Pain Assessment Patient in pain? no        Primary Care Provider:  Ellery Plunk MD  CC:  HTN.  History of Present Illness: 1. HTN: Pt is here for follow up on her blood pressure.  She needs a refill on her HCTZ.  She has been taking her medicines as prescribed.  She doesn't check her blood pressure at home regularly.      ROS: denies any chest pain, shortness of breath, headache  2. Ears congestion:  She feels that she has some wax or something in both of her ears      ROS: denies decreased hearing or balance problems.  Habits & Providers  Alcohol-Tobacco-Diet     Tobacco Status: never  Current Medications (verified): 1)  Omeprazole 20 Mg Cpdr (Omeprazole) .... One By Mouth Daily 2)  Blood Pressure Monitor and Cuff .... Please Provide On Automatic Digital Bp Reader and Cuff. Patient Has Htn. Icd 9 Code 401.1 3)  Ventolin Hfa 108 (90 Base) Mcg/act Aers (Albuterol Sulfate) .... 2 Puffs Inhaled Every 4 Hours As Needed For Cough and Shortness of Breath 4)  Premarin 0.625 Mg/gm Crea (Estrogens, Conjugated) .... Apply 2-4 Gm Daily To Vagina and Then Reduce Gradually. Dispense 1 Large Tube 5)  Mirtazapine 30 Mg Tabs (Mirtazapine) .... Take One Daily At Bedtime 6)  Hydrochlorothiazide 25 Mg Tabs (Hydrochlorothiazide) .... Take One By Mouth Daily 7)  Ibuprofen 600 Mg  Tabs (Ibuprofen) .... One By Mouth Three Times A Day With Food  Allergies: No Known Drug Allergies  Family History: Reviewed history from 12/18/2007 and no changes required. 5 sisters, 4 brothers - healthy, Father(72)--healthy, Mother(60`s)--HTN, gallstones, no DM or CA  Physical Exam  General:   vitals reviewed.  no acute distress. Head:  normocephalic and atraumatic.   Eyes:  vision grossly intact.  Dysconjugate gaze. Ears:  TM well visualized and R ear normal and L ear normal.   Neck:  no JVD Lungs:  normal respiratory effort and normal breath sounds.   Heart:  normal rate and regular rhythm, soft systolic murmur. Extremities:  no lower extremity edema   Impression & Recommendations:  Problem # 1:  HYPERTENSION, BENIGN ESSENTIAL (ICD-401.1) Assessment Unchanged  At goal, continue current treatment. Her updated medication list for this problem includes:    Hydrochlorothiazide 25 Mg Tabs (Hydrochlorothiazide) .Marland Kitchen... Take one by mouth daily  Orders: [facility]- Est Level  3 ([address]52)  Complete Medication List: 1)  Omeprazole 20 Mg Cpdr (Omeprazole) .... One by mouth daily 2)  Blood Pressure Monitor and Cuff  .... Please provide on automatic digital bp reader and cuff. patient has htn. icd 9 code 401.1 3)  Ventolin Hfa 108 (90 Base) Mcg/act Aers (Albuterol sulfate) .... 2 puffs inhaled every 4 hours as needed for cough and shortness of breath 4)  Premarin 0.625 Mg/gm Crea (Estrogens, conjugated) .... Apply 2-4 gm daily to vagina and then reduce gradually. dispense 1 large tube 5)  Mirtazapine 30 Mg Tabs (Mirtazapine) .... Take one daily  at bedtime 6)  Hydrochlorothiazide 25 Mg Tabs (Hydrochlorothiazide) .... Take one by mouth daily 7)  Ibuprofen 600 Mg Tabs (Ibuprofen) .... One by mouth three times a day with food  Patient Instructions: 1)  Continue taking your medicines as prescribed 2)  Please schedule a follow up appointment with Dr. Hulen Luster for your annual exam Prescriptions: HYDROCHLOROTHIAZIDE 25 MG TABS (HYDROCHLOROTHIAZIDE) take one by mouth daily  #30 x 6   Entered and Authorized by:   Angelena Sole MD   Signed by:   Angelena Sole MD on 06/24/2009   Method used:   [facility]        [facility] [address] [phone]* (retail)       [address]        [place], [place]  [address]       Ph: [phone] or [phone]       Fax: [phone]   RxID:   [id number]   Prevention & Chronic Care Immunizations   Influenza vaccine: Fluvax MCR  (02/13/2009)   Influenza vaccine due: 02/05/2009    Tetanus booster: 11/20/2002: Done.   Tetanus booster due: 11/19/2012    Pneumococcal vaccine: Not documented  Colorectal Screening   Hemoccult: Not documented   Hemoccult due: Not Indicated    Colonoscopy: normal  (04/01/2008)   Colonoscopy due: 04/01/2018  Other Screening   Pap smear: normal  (11/23/2007)   Pap smear due: 11/22/2008    Mammogram: Done.  (11/21/2004)   Mammogram due: 11/21/2005   Smoking status: never  (06/24/2009)  Lipids   Total Cholesterol: 198  (11/23/2007)   LDL: 125  (11/23/2007)   LDL Direct: Not documented   HDL: 58  (11/23/2007)   Triglycerides: 74  (11/23/2007)  Hypertension   Last Blood Pressure: 127 / 80  (06/24/2009)   Serum creatinine: 0.99  (05/08/2008)   Serum potassium 4.3  (05/08/2008)    Hypertension flowsheet reviewed?: Yes   Progress toward BP goal: At goal  Self-Management Support :   Personal Goals (by the next clinic visit) :      Personal blood pressure goal: 140/90  (02/12/2009)   Hypertension self-management support: Written self-care plan  (02/12/2009)

## 2010-05-18 NOTE — Assessment & Plan Note (Signed)
Summary: uti?,df   Vital Signs:  Patient profile:   55 year old female Weight:      153 pounds Pulse rate:   98 / minute BP sitting:   142 / 87  (right arm)  Vitals Entered By: Arlyss Repress CMA, (November 16, 2009 2:35 PM) CC: dysuria x ? days. Is Patient Diabetic? No Pain Assessment Patient in pain? no        Primary Care Provider:  Ellery Plunk MD  CC:  dysuria x ? days.Marland Kitchen  History of Present Illness: Burns on the ouside when she voids.  Complete physcial with GYN exam in May.  Has been taking metrinidazole for "bacterial infection".  Habits & Providers  Alcohol-Tobacco-Diet     Tobacco Status: never  Allergies: No Known Drug Allergies  Physical Exam  General:  Well-developed,well-nourished,in no acute distress; alert,appropriate and cooperative throughout examination   Impression & Recommendations:  Problem # 1:  URINARY FREQUENCY (ICD-788.41) Burning on outside with treat for vulvitis with topical antifungal cream.  Return for pelvic if no improvement.  Blood  surgar normal in May at screening. Orders: Urinalysis-[facility] (00000) [facility]- Est Level  3 (09811)  Complete Medication List: 1)  Omeprazole 20 Mg Cpdr (Omeprazole) .... One by mouth daily 2)  Blood Pressure Monitor and Cuff  .... Please provide on automatic digital bp reader and cuff. patient has htn. icd 9 code 401.1 3)  Premarin 0.625 Mg/gm Crea (Estrogens, conjugated) .... Apply 2-4 gm daily to vagina and then reduce gradually. dispense 1 large tube 4)  Mirtazapine 45 Mg Tabs (Mirtazapine) .... Take one at bedtime 5)  Hydrochlorothiazide 25 Mg Tabs (Hydrochlorothiazide) .... Take one by mouth daily 6)  Ketoconazole 2 % Crea (Ketoconazole) .... Apply to area two times a day, 30 gm  Patient Instructions: 1)  Wash with mild soap, use cream afterwards thinly 2)  Return for no improvement 3)  Your urine did not show an infection Prescriptions: KETOCONAZOLE 2 % CREA (KETOCONAZOLE) apply to area two times a  day, 30 gm  #1 x 1   Entered and Authorized by:   Luretha Murphy NP   Signed by:   Luretha Murphy NP on 11/16/2009   Method used:   [facility] to        [facility] [address] [phone]* (retail)       [address]       [place], [place]  [address]       Ph: [phone] or [phone]       Fax: [phone]   RxID:   [id number]   Laboratory Results   Urine Tests  Date/Time Received: November 16, 2009 2:39 PM  Date/Time Reported: November 16, 2009 2:59 PM   Routine Urinalysis   Color: yellow Appearance: Hazy Glucose: negative   (Normal Range: Negative) Bilirubin: negative   (Normal Range: Negative) Ketone: negative   (Normal Range: Negative) Spec. Gravity: 1.020   (Normal Range: 1.003-1.035) Blood: negative   (Normal Range: Negative) pH: 5.0   (Normal Range: 5.0-8.0) Protein: negative   (Normal Range: Negative) Urobilinogen: 0.2   (Normal Range: 0-1) Nitrite: negative   (Normal Range: Negative) Leukocyte Esterace: trace   (Normal Range: Negative)  Urine Microscopic WBC/HPF: 1-3 Bacteria/HPF: 2+ Epithelial/HPF: 15-25 Yeast/HPF: many    Comments: ...............test performed by......Marland KitchenBonnie A. [place], MLS (ASCP)cm

## 2010-05-18 NOTE — Progress Notes (Signed)
Summary: Test Res  Phone Note Call from Patient Call back at Home Phone [phone]   Caller: Patient Summary of Call: Pt checking on results from last week. Initial call taken by: Clydell Hakim,  May 19, 2009 10:13 AM  Follow-up for Phone Call         letter from MD read to patient . Follow-up by: Theresia Lo RN,  May 19, 2009 2:43 PM

## 2010-05-18 NOTE — Assessment & Plan Note (Signed)
Summary: cpe,tcb   Vital Signs:  Patient profile:   55 year old female Height:      63 inches Weight:      152.9 pounds BMI:     27.18 Temp:     98.3 degrees F oral Pulse rate:   114 / minute BP sitting:   145 / 84  (left arm) Cuff size:   regular  Vitals Entered By: Garen Grams LPN (July 31, 2009 2:55 PM) CC: urinary frequency Is Patient Diabetic? No Pain Assessment Patient in pain? yes     Location: legs   Primary Care Provider:  Ellery Plunk MD  CC:  urinary frequency.  History of Present Illness: Pt would like CPE rescheduled for later to discuss her urinary frequency.  pt reports that she wakes up several times a night to pee.  She does not have increased frequency during the day.  no dysuria.  She says that she drinks several caffienated sodas before bedtime each night.    Knee pain- bilateral, above knees, worse with standing long periods.  Did not try motrin because worried about stomach issues.  the same pain as last time evaluated.  Habits & Providers  Alcohol-Tobacco-Diet     Tobacco Status: never  Current Medications (verified): 1)  Omeprazole 20 Mg Cpdr (Omeprazole) .... One By Mouth Daily 2)  Blood Pressure Monitor and Cuff .... Please Provide On Automatic Digital Bp Reader and Cuff. Patient Has Htn. Icd 9 Code 401.1 3)  Ventolin Hfa 108 (90 Base) Mcg/act Aers (Albuterol Sulfate) .... 2 Puffs Inhaled Every 4 Hours As Needed For Cough and Shortness of Breath 4)  Premarin 0.625 Mg/gm Crea (Estrogens, Conjugated) .... Apply 2-4 Gm Daily To Vagina and Then Reduce Gradually. Dispense 1 Large Tube 5)  Mirtazapine 45 Mg Tabs (Mirtazapine) .... Take One At Bedtime 6)  Hydrochlorothiazide 25 Mg Tabs (Hydrochlorothiazide) .... Take One By Mouth Daily 7)  Ibuprofen 600 Mg  Tabs (Ibuprofen) .... One By Mouth Three Times A Day With Food  Allergies (verified): No Known Drug Allergies  Review of Systems  The patient denies anorexia, fever, weight loss,  hoarseness, chest pain, syncope, and abdominal pain.    Physical Exam  General:  Well-developed,well-nourished,in no acute distress; alert,appropriate and cooperative throughout examination Lungs:  Normal respiratory effort, chest expands symmetrically. Lungs are clear to auscultation, no crackles or wheezes. Heart:  Normal rate and regular rhythm. S1 and S2 normal without gallop, murmur, click, rub or other extra sounds. Abdomen:  Bowel sounds positive,abdomen soft and non-tender without masses, organomegaly or hernias noted.   Knee Exam  General:    Well-developed, well-nourished, normal body habitus; no deformities, normal grooming.  Gait:    Normal heel-toe gait pattern bilaterally.    Skin:    Intact, no scars, lesions, rashes, cafe au lait spots, or bruising.    Inspection:     No deformity, ecchymosis or swelling.   Palpation:    Non-tender to palpation over medial joint line, lateral joint line, parapatellar, condylar, patellar tendon, or Pes bursa.  tender 5 cm above knee bilaterally   Impression & Recommendations:  Problem # 1:  KNEE PAIN, BILATERAL (ICD-719.46) Assessment Unchanged asked pt to try motrin for this pain.  already on motrin for stomach protection.  recently evaluated by dr. Katrinka Blazing.   Her updated medication list for this problem includes:    Ibuprofen 600 Mg Tabs (Ibuprofen) ..... One by mouth three times a day with food  Orders: [facility]- Est Level  3 (62694)  Problem # 2:  URINARY FREQUENCY (ICD-788.41) Assessment: Deteriorated checked U/A.  Think likely due to sodas before bed.  Asked her to not drink so many and esp not right before bed.  U/a did not show infection. no other symptoms but if continues consider wet prep. Orders: Urinalysis-[facility] (00000) [facility]- Est Level  3 (85462)  Complete Medication List: 1)  Omeprazole 20 Mg Cpdr (Omeprazole) .... One by mouth daily 2)  Blood Pressure Monitor and Cuff  .... Please provide on automatic digital bp  reader and cuff. patient has htn. icd 9 code 401.1 3)  Ventolin Hfa 108 (90 Base) Mcg/act Aers (Albuterol sulfate) .... 2 puffs inhaled every 4 hours as needed for cough and shortness of breath 4)  Premarin 0.625 Mg/gm Crea (Estrogens, conjugated) .... Apply 2-4 gm daily to vagina and then reduce gradually. dispense 1 large tube 5)  Mirtazapine 45 Mg Tabs (Mirtazapine) .... Take one at bedtime 6)  Hydrochlorothiazide 25 Mg Tabs (Hydrochlorothiazide) .... Take one by mouth daily 7)  Ibuprofen 600 Mg Tabs (Ibuprofen) .... One by mouth three times a day with food  Other Orders: Future Orders: Comp Met-[facility] ([id number]) ... 07/29/2010 Lipid-[facility] ([id number]) ... 07/29/2010 CBC-[facility] (78938) ... 07/29/2010  Patient Instructions: 1)  come back to clinic for your physical exam 2)  You can come back next week without eating anything, you can drink water.  make the lab appt at the desk.    3)  If you have a urinary infection, I'll call you 4)  I increased your dose of your night time medicine Prescriptions: MIRTAZAPINE 45 MG TABS (MIRTAZAPINE) take one at bedtime  #30 x 6   Entered and Authorized by:   Ellery Plunk MD   Signed by:   Ellery Plunk MD on 07/31/2009   Method used:   [facility]ron[facility] to        [facility] [address] [phone]* (retail)       [address]       [place], [place]  [address]       Ph: [phone] or [phone]       Fax: [phone]   RxID:   [id number] IBUPROFEN 600 MG  TABS (IBUPROFEN) one by mouth three times a day WITH FOOD  #21 x 0   Entered and Authorized by:   Ellery Plunk MD   Signed by:   Ellery Plunk MD on 07/31/2009   Method used:   Electronically to        [facility] [address] [phone]* (retail)       [address]       [place], [place]  [address]       Ph: [phone] or [phone]       Fax: [phone]   RxID:   [id number]   Laboratory Results   Urine Tests  Date/Time Received:  July 31, 2009 3:40 PM  Date/Time Reported: July 31, 2009 4:07 PM   Routine Urinalysis   Color: yellow Appearance: Clear Glucose: negative   (Normal Range: Negative) Bilirubin: negative   (Normal Range: Negative) Ketone: negative   (Normal Range: Negative) Spec. Gravity: 1.020   (Normal Range: 1.003-1.035) Blood: negative   (Normal Range: Negative) pH: 5.0   (Normal Range: 5.0-8.0) Protein: negative   (Normal Range: Negative) Urobilinogen: 0.2   (Normal Range: 0-1) Nitrite: negative   (Normal Range: Negative) Leukocyte Esterace: negative   (Normal  Range: Negative)    Comments: ...............test performed by......Marland KitchenBonnie A. [place], MLS (ASCP)cm

## 2010-05-20 NOTE — Miscellaneous (Signed)
  Clinical Lists Changes  Medications: Removed medication of FLUCONAZOLE 100 MG TABS (FLUCONAZOLE) take 1 tab by mouth now then 1 in 4 days Removed medication of KETOCONAZOLE 2 % CREA (KETOCONAZOLE) apply to area two times a day, 30 gm Removed medication of PREMARIN 0.625 MG/GM CREA (ESTROGENS, CONJUGATED) Apply 2-4 gm daily to vagina and then reduce gradually. Dispense 1 large tube Added new medication of TRAZODONE HCL 50 MG TABS (TRAZODONE HCL) take one by mouth at bedtime as needed insomnia - Signed Rx of TRAZODONE HCL 50 MG TABS (TRAZODONE HCL) take one by mouth at bedtime as needed insomnia;  #39 x 1;  Signed;  Entered by: Ellery Plunk MD;  Authorized by: Ellery Plunk MD;  Method used: [facility] [address] [phone]*, [address], [place], [place], [place]  [address], Ph: [phone] or [phone], Fax: [phone]    Prescriptions: TRAZODONE HCL 50 MG TABS (TRAZODONE HCL) take one by mouth at bedtime as needed insomnia  #39 x 1   Entered and Authorized by:   Ellery Plunk MD   Signed by:   Ellery Plunk MD on 03/31/2010   Method used:   Electronically to        [facility] [address] [phone]* (retail)       [address]       [place], [place]  [address]       Ph: [phone] or [phone]       Fax: [phone]   RxID:   [id number]

## 2010-06-04 ENCOUNTER — Encounter: Payer: Self-pay | Admitting: *Deleted

## 2010-06-23 ENCOUNTER — Other Ambulatory Visit: Payer: Self-pay | Admitting: Family Medicine

## 2010-06-23 MED ORDER — TRAZODONE HCL 50 MG PO TABS: 50.0000 mg | ORAL_TABLET | Freq: Every evening | ORAL | Status: DC | PRN

## 2010-07-01 LAB — POCT URINALYSIS DIPSTICK
Bilirubin Urine: NEGATIVE
Hgb urine dipstick: NEGATIVE
Ketones, ur: NEGATIVE mg/dL
Protein, ur: NEGATIVE mg/dL
pH: 5 (ref 5.0–8.0)

## 2010-07-02 ENCOUNTER — Inpatient Hospital Stay (INDEPENDENT_AMBULATORY_CARE_PROVIDER_SITE_OTHER)
Admission: RE | Admit: 2010-07-02 | Discharge: 2010-07-02 | Disposition: A | Discharge status: Home / Self Care | Payer: Medicare Other | Source: Ambulatory Visit | Attending: Family Medicine | Admitting: Family Medicine

## 2010-07-02 DIAGNOSIS — G5600 Carpal tunnel syndrome, unspecified upper limb: Secondary | ICD-10-CM

## 2010-07-15 ENCOUNTER — Ambulatory Visit: Payer: Self-pay | Admitting: Family Medicine

## 2010-08-15 ENCOUNTER — Other Ambulatory Visit: Payer: Self-pay | Admitting: Family Medicine

## 2010-08-15 ENCOUNTER — Inpatient Hospital Stay (INDEPENDENT_AMBULATORY_CARE_PROVIDER_SITE_OTHER)
Admission: RE | Admit: 2010-08-15 | Discharge: 2010-08-15 | Disposition: A | Discharge status: Home / Self Care | Payer: Medicare Other | Source: Ambulatory Visit | Attending: Family Medicine | Admitting: Family Medicine

## 2010-08-15 NOTE — Telephone Encounter (Signed)
Refill request

## 2010-08-27 ENCOUNTER — Ambulatory Visit (INDEPENDENT_AMBULATORY_CARE_PROVIDER_SITE_OTHER): Payer: Medicare Other | Admitting: Family Medicine

## 2010-08-27 VITALS — BP 147/78 | HR 109 | Temp 98.4°F | Wt 153.0 lb

## 2010-08-27 DIAGNOSIS — N739 Female pelvic inflammatory disease, unspecified: Secondary | ICD-10-CM

## 2010-08-27 DIAGNOSIS — R300 Dysuria: Secondary | ICD-10-CM

## 2010-08-27 DIAGNOSIS — R350 Frequency of micturition: Secondary | ICD-10-CM

## 2010-08-27 DIAGNOSIS — N760 Acute vaginitis: Secondary | ICD-10-CM

## 2010-08-27 LAB — POCT WET PREP (WET MOUNT)
Clue Cells Wet Prep HPF POC: NEGATIVE
Trichomonas Wet Prep HPF POC: NEGATIVE
Yeast Wet Prep HPF POC: NEGATIVE

## 2010-08-27 LAB — POCT URINALYSIS DIPSTICK
Bilirubin, UA: NEGATIVE
Blood, UA: NEGATIVE
Glucose, UA: NEGATIVE
Ketones, UA: NEGATIVE
Nitrite, UA: NEGATIVE
Spec Grav, UA: 1.015
pH, UA: 5.5

## 2010-08-29 NOTE — Progress Notes (Signed)
  Subjective:    Patient ID: Regina Sawyer, female    DOB: [birth year], 55 y.o.   MRN: [id number]  HPI Complains of urinary frequency and external vaginal itch.  No d/c, no odor, no dysuria, no fevers, no new partners.     Review of Systems    see above Objective:   Physical Exam     Vital signs reviewed General appearance - alert, well appearing, and in no distress and oriented to person, place, and time GYN- external genetalia normal, without lesions.  Hair shaved on mons without lesions.  Vagina normal color, rugations, without discharge.  Cervix normal color without lesions or discharge.    Assessment & Plan:

## 2010-08-29 NOTE — Assessment & Plan Note (Signed)
Complaining of increased frequency and itching externally where she shaved her pubic hair.  Nothing on U/A or wet prep.  Advised to monitor and avoid shaving that area.

## 2010-10-02 ENCOUNTER — Other Ambulatory Visit: Payer: Self-pay | Admitting: Family Medicine

## 2010-10-04 NOTE — Telephone Encounter (Signed)
Refill request

## 2010-10-06 ENCOUNTER — Ambulatory Visit (INDEPENDENT_AMBULATORY_CARE_PROVIDER_SITE_OTHER): Payer: Medicare Other | Admitting: Family Medicine

## 2010-10-06 VITALS — BP 131/78 | HR 84 | Temp 98.3°F | Ht 65.0 in | Wt 149.0 lb

## 2010-10-06 DIAGNOSIS — N898 Other specified noninflammatory disorders of vagina: Secondary | ICD-10-CM

## 2010-10-06 DIAGNOSIS — N760 Acute vaginitis: Secondary | ICD-10-CM

## 2010-10-06 DIAGNOSIS — R300 Dysuria: Secondary | ICD-10-CM

## 2010-10-06 LAB — POCT URINALYSIS DIPSTICK
Bilirubin, UA: NEGATIVE
Blood, UA: NEGATIVE
Glucose, UA: NEGATIVE
Leukocytes, UA: NEGATIVE
Nitrite, UA: NEGATIVE

## 2010-10-06 LAB — POCT WET PREP (WET MOUNT): Clue Cells Wet Prep HPF POC: NEGATIVE

## 2010-10-06 MED ORDER — FLUCONAZOLE 150 MG PO TABS: 150.0000 mg | ORAL_TABLET | Freq: Once | ORAL | Status: DC

## 2010-10-06 NOTE — Progress Notes (Signed)
  Subjective:    Patient ID: Regina Sawyer, female    DOB: 05/18/1955, 55 y.o.   MRN: [id number]  HPIHere with husband  Feels she has some vagina irritation, ? Discharge x 3 days.  Has an odor, feels it to be like previous times she has bacterial vaginosis.  No dysuria, but feels she has urinary frequency.  Has some abdominal bloating but this is not related to this episode of irritation.  No vaginal bleeding.  Has had candida and BV frequently.  Has been prescribe a vaginal estrogen cream by gynecology but she does not use it.   Review of Systemssee hpi     Objective:   Physical Exam General appearance: alert, cooperative and low health literacy Abdomen: soft, non-tender; bowel sounds normal; no masses,  no organomegaly Pelvic: cervix normal in appearance, external genitalia normal, no adnexal masses or tenderness, no bladder tenderness, no cervical motion tenderness, perianal skin: no external genital warts noted, uterus normal size, shape, and consistency and thin white discharge        Assessment & Plan:

## 2010-10-06 NOTE — Assessment & Plan Note (Addendum)
Yeast vaginitis.  Advised to also continue using premarin cream as directed by ob as i think she has a component of atrophic vaginitis causing burning.  Advised to try different condoms/lubrication as patient states they irritate her skin., ut again, may be due to atrophic vaginitis causing sensetization of friable skin

## 2010-10-07 ENCOUNTER — Encounter: Payer: Self-pay | Admitting: Family Medicine

## 2010-10-07 LAB — GC/CHLAMYDIA PROBE AMP, GENITAL
Chlamydia, DNA Probe: NEGATIVE
GC Probe Amp, Genital: NEGATIVE

## 2010-11-30 ENCOUNTER — Ambulatory Visit: Payer: Medicare Other | Admitting: Family Medicine

## 2010-12-11 ENCOUNTER — Inpatient Hospital Stay (INDEPENDENT_AMBULATORY_CARE_PROVIDER_SITE_OTHER)
Admission: RE | Admit: 2010-12-11 | Discharge: 2010-12-11 | Disposition: A | Discharge status: Home / Self Care | Payer: Medicare Other | Source: Ambulatory Visit | Attending: Emergency Medicine | Admitting: Emergency Medicine

## 2010-12-11 DIAGNOSIS — M722 Plantar fascial fibromatosis: Secondary | ICD-10-CM

## 2011-01-05 LAB — POCT URINALYSIS DIP (DEVICE)
Bilirubin Urine: NEGATIVE
Glucose, UA: NEGATIVE
Hgb urine dipstick: NEGATIVE
Nitrite: NEGATIVE
Urobilinogen, UA: 0.2
pH: 5.5

## 2011-02-05 ENCOUNTER — Other Ambulatory Visit: Payer: Self-pay | Admitting: Family Medicine

## 2011-02-07 NOTE — Telephone Encounter (Signed)
Refill request

## 2011-03-24 ENCOUNTER — Encounter: Payer: Medicare Other | Admitting: Family Medicine

## 2011-04-01 ENCOUNTER — Encounter: Payer: Self-pay | Admitting: *Deleted

## 2011-04-01 NOTE — Telephone Encounter (Signed)
This encounter was created in error - please disregard.

## 2011-04-14 ENCOUNTER — Other Ambulatory Visit: Payer: Self-pay | Admitting: Family Medicine

## 2011-04-14 NOTE — Telephone Encounter (Signed)
Refill request

## 2011-05-30 DIAGNOSIS — R300 Dysuria: Secondary | ICD-10-CM | POA: Diagnosis not present

## 2011-05-30 DIAGNOSIS — N390 Urinary tract infection, site not specified: Secondary | ICD-10-CM | POA: Diagnosis not present

## 2011-06-04 ENCOUNTER — Emergency Department (INDEPENDENT_AMBULATORY_CARE_PROVIDER_SITE_OTHER)
Admission: EM | Admit: 2011-06-04 | Discharge: 2011-06-04 | Disposition: A | Discharge status: Home / Self Care | Payer: Medicare Other | Source: Home / Self Care | Attending: Emergency Medicine | Admitting: Emergency Medicine

## 2011-06-04 ENCOUNTER — Encounter (HOSPITAL_COMMUNITY): Payer: Self-pay | Admitting: Emergency Medicine

## 2011-06-04 ENCOUNTER — Emergency Department (INDEPENDENT_AMBULATORY_CARE_PROVIDER_SITE_OTHER): Payer: Medicare Other

## 2011-06-04 DIAGNOSIS — M25539 Pain in unspecified wrist: Secondary | ICD-10-CM | POA: Diagnosis not present

## 2011-06-04 DIAGNOSIS — S40029A Contusion of unspecified upper arm, initial encounter: Secondary | ICD-10-CM

## 2011-06-04 DIAGNOSIS — S40022A Contusion of left upper arm, initial encounter: Secondary | ICD-10-CM

## 2011-06-04 HISTORY — DX: Essential (primary) hypertension: I10

## 2011-06-04 HISTORY — DX: Cardiac murmur, unspecified: R01.1

## 2011-06-04 HISTORY — DX: Gastro-esophageal reflux disease without esophagitis: K21.9

## 2011-06-04 HISTORY — DX: Reserved for concepts with insufficient information to code with codable children: IMO0002

## 2011-06-04 MED ORDER — MELOXICAM 7.5 MG PO TABS: 7.5000 mg | ORAL_TABLET | Freq: Every day | ORAL | Status: DC

## 2011-06-04 NOTE — ED Notes (Signed)
Left wrist and hand pain, pain for approx one week.  Pain in left wrist, burning, tingling sensation.  Reports hitting wrist on a shelf, accidentally.  Since then pain has continued, no improvement.  Able to move fingers.  Radial pulses, right wrist are 2 plus.  Capillary refill to left fingernail beds is brisk.

## 2011-06-04 NOTE — Discharge Instructions (Signed)
Contusion A contusion is a deep bruise. Bruises happen when an injury causes bleeding under the skin. Signs of bruising include pain, puffiness (swelling), and discolored skin. The bruise may turn blue, purple, or yellow. HOME CARE   Rest the injured area until the pain and puffiness are better.   Try to limit use of the injured area as much as possible or as told by your doctor.   Put ice on the injured area.   Put ice in a plastic bag.   Place a towel between your skin and the bag.   Leave the ice on for 15 to 20 minutes, 3 to 4 times a day.   Raise (elevate) the injured area above the level of the heart.   Use an elastic bandage to lessen puffiness and motion.   Only take medicine as told by your doctor.   Eat healthy.   See your doctor for a follow-up visit.  GET HELP RIGHT AWAY IF:   There is more redness, puffiness, or pain.   You have a headache, muscle ache, or you feel dizzy and ill.   You have a fever.   The pain is not controlled with medicine.   The bruise is not getting better.   There is yellowish white fluid (pus) coming from the wound.   You lose feeling (numbness) in the injured area.   The bruised area feels cold.   There are new problems.  MAKE SURE YOU:   Understand these instructions.   Will watch your condition.   Will get help right away if you are not doing well or get worse.  Document Released: 09/21/2007 Document Revised: 12/15/2010 Document Reviewed: 09/21/2007 [employer] Patient Information 2012 [employer]. 

## 2011-06-04 NOTE — ED Provider Notes (Signed)
History     CSN: [id number]  Arrival date & time 06/04/11  1248   First MD Initiated Contact with Patient 06/04/11 1335      No chief complaint on file.   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 Describes swelling has gone down a little bit but still tender with movement and touch and swelling is completely gone.  Patient is a 56 y.o. female presenting with wrist pain. The history is provided by the patient.  Wrist Pain This is a new problem. The current episode started more than 1 week ago. The problem has been gradually worsening. Associated symptoms include headaches. Exacerbated by: moving. The symptoms are relieved by nothing. She has tried nothing for the symptoms.    No past medical history on file.  No past surgical history on file.  No family history on file.  History  Substance Use Topics  . Smoking status: Not on file  . Smokeless tobacco: Not on file  . Alcohol Use: Not on file    OB History    No data available      Review of Systems  Constitutional: Negative for fever and chills.  Musculoskeletal: Positive for joint swelling.  Skin: Negative for color change.  Neurological: Positive for headaches. Negative for syncope and speech difficulty.    Allergies  Review of patient's allergies indicates no known allergies.  Home Medications   Current Outpatient Rx  Name Route Sig Dispense Refill  . BLOOD PRESSURE MONITOR MISC  Please provide on automatic digital BP reader and cuff.     Marland Kitchen FLUCONAZOLE 150 MG PO TABS Oral Take 1 tablet (150 mg total) by mouth once. May repeat in 3 days if not resolved 2 tablet 0  .  HYDROCHLOROTHIAZIDE 25 MG PO TABS Oral Take 25 mg by mouth daily.      Marland Kitchen MIRTAZAPINE 45 MG PO TABS  TAKE 1 TABLET BY MOUTH AT BEDTIME 30 tablet 5  . OMEPRAZOLE 20 MG PO CPDR  TAKE ONE CAPSULE BY MOUTH EVERY DAY 30 capsule 11  . TRAZODONE HCL 50 MG PO TABS  TAKE 1 TABLET (50 MG TOTAL) BY MOUTH AT BEDTIME AS NEEDED. 30 tablet 6    BP 147/88  Pulse 90  Temp(Src) 98.4 F (36.9 C) (Oral)  Resp 18  SpO2 97%  Physical Exam  Constitutional: She appears well-developed and well-nourished. No distress.  Musculoskeletal: She exhibits tenderness. She exhibits no edema.       Left wrist: She exhibits decreased range of motion, tenderness, bony tenderness and swelling. She exhibits no effusion, no crepitus, no deformity and no laceration.       Arms: Neurological: She is alert. No cranial nerve deficit or sensory deficit. She exhibits normal muscle tone.  Skin: Skin is warm. No abrasion, no ecchymosis, no laceration and no rash noted. No erythema.    ED Course  Procedures (including critical care time)  Labs Reviewed - No data to display No results found.   No diagnosis found.    MDM   Left wrist contusion no neurovascular compromise. Full range of motion.       Jimmie Molly, MD 06/04/11 1356

## 2011-06-20 DIAGNOSIS — M79609 Pain in unspecified limb: Secondary | ICD-10-CM | POA: Diagnosis not present

## 2011-06-29 ENCOUNTER — Encounter (HOSPITAL_COMMUNITY): Payer: Self-pay

## 2011-06-29 ENCOUNTER — Ambulatory Visit (INDEPENDENT_AMBULATORY_CARE_PROVIDER_SITE_OTHER): Payer: Medicare Other | Admitting: Family Medicine

## 2011-06-29 ENCOUNTER — Emergency Department (HOSPITAL_COMMUNITY)
Admission: EM | Admit: 2011-06-29 | Discharge: 2011-06-29 | Discharge status: Against Medical Advice | Payer: Medicare Other | Source: Home / Self Care | Attending: Emergency Medicine | Admitting: Emergency Medicine

## 2011-06-29 ENCOUNTER — Encounter: Payer: Self-pay | Admitting: Family Medicine

## 2011-06-29 VITALS — BP 133/96 | HR 111 | Temp 99.8°F | Ht 65.0 in | Wt 147.8 lb

## 2011-06-29 DIAGNOSIS — A09 Infectious gastroenteritis and colitis, unspecified: Secondary | ICD-10-CM

## 2011-06-29 DIAGNOSIS — A084 Viral intestinal infection, unspecified: Secondary | ICD-10-CM | POA: Insufficient documentation

## 2011-06-29 MED ORDER — ONDANSETRON 4 MG PO TBDP: 4.0000 mg | ORAL_TABLET | Freq: Three times a day (TID) | ORAL | Status: DC | PRN

## 2011-06-29 NOTE — ED Notes (Signed)
C/o nausea, vomiting, frequent bm's for past 3 days; NAD at present

## 2011-06-29 NOTE — Patient Instructions (Signed)
It was nice to meet you today. Please go to your pharmacy and pick up the following medications: 1. Nyquil and/or Dayquil 2. Imodium 2 mg after each loose stool 3. Chamomile tea to help sleep at night  Try to drink plenty of fluids and get plenty of rest. If you are not feeling better within 5-7 days, please return to clinic. Viral infections can last up to 3-4 weeks.

## 2011-06-29 NOTE — Progress Notes (Signed)
  Subjective:     Cecia Egge is a 56 y.o. female who presents for evaluation of nonbilious vomiting a few times per day, diarrhea 4 times per day and nausea. Symptoms have been present for 3 days. Patient denies blood in stool, constipation, dysuria and fever. Patient's oral intake has been decreased for solids.  Patient has not vomited at all today.  She did have loose stool this afternoon.  Patient's urine output has been adequate. No sick contacts. Patient denies recent travel history. Patient has not had recent ingestion of possible contaminated food.  She also complains of mild abdominal pain but this is improving.    The following portions of the patient's history were reviewed and updated as appropriate: allergies, current medications, past medical history and past social history.  Review of Systems Pertinent items are noted in HPI.    Objective:     General appearance: alert, cooperative and no distress Head: Normocephalic, without obvious abnormality, atraumatic Lungs: clear to auscultation bilaterally Heart: regular rate and rhythm, S1, S2 normal, no murmur, click, rub or gallop Abdomen: soft, non-tender; bowel sounds normal; no masses,  no organomegaly    Assessment:    Acute Gastroenteritis    Plan:     1. Discussed oral rehydration, reintroduction of solid foods, signs of dehydration. 2. Return or go to emergency department if worsening symptoms, blood or bile, signs of dehydration, diarrhea lasting longer than 5 days or any new concerns. 3. Recommended purchasing OTC Nyquil, Imodium.  Will send Zofran to pharmacy. 4. Follow up in 3 weeks or sooner as needed.

## 2011-06-30 ENCOUNTER — Ambulatory Visit: Payer: Medicare Other | Admitting: Family Medicine

## 2011-07-04 ENCOUNTER — Ambulatory Visit (INDEPENDENT_AMBULATORY_CARE_PROVIDER_SITE_OTHER): Payer: Medicare Other | Admitting: Family Medicine

## 2011-07-04 ENCOUNTER — Encounter: Payer: Self-pay | Admitting: Family Medicine

## 2011-07-04 VITALS — BP 139/88 | HR 94 | Temp 97.8°F | Ht 65.0 in | Wt 150.0 lb

## 2011-07-04 DIAGNOSIS — L290 Pruritus ani: Secondary | ICD-10-CM | POA: Diagnosis not present

## 2011-07-04 DIAGNOSIS — E785 Hyperlipidemia, unspecified: Secondary | ICD-10-CM | POA: Diagnosis not present

## 2011-07-04 DIAGNOSIS — R3589 Other polyuria: Secondary | ICD-10-CM | POA: Diagnosis not present

## 2011-07-04 DIAGNOSIS — R358 Other polyuria: Secondary | ICD-10-CM

## 2011-07-04 LAB — POCT URINALYSIS DIPSTICK
Nitrite, UA: NEGATIVE
Protein, UA: NEGATIVE
Spec Grav, UA: 1.015
Urobilinogen, UA: 0.2

## 2011-07-04 LAB — COMPREHENSIVE METABOLIC PANEL
AST: 20 U/L (ref 0–37)
Alkaline Phosphatase: 94 U/L (ref 39–117)
BUN: 17 mg/dL (ref 6–23)
Glucose, Bld: 88 mg/dL (ref 70–99)
Total Bilirubin: 0.3 mg/dL (ref 0.3–1.2)

## 2011-07-04 LAB — POCT UA - MICROSCOPIC ONLY

## 2011-07-04 NOTE — Patient Instructions (Signed)
For your itching, please get them flushable wipes that you can use instead of toilet paper for now You can also use the preparation H. Wipes You may use a small amount of cortisone once a day for 2-3 days to help with the itch Come back in one week if no better  Please go get your mammogram

## 2011-07-04 NOTE — Assessment & Plan Note (Signed)
Rectal itching after irritation due to gastroenteritis. Advised to switch to flushable wipes for the next several days. May use a very small amount of cortisone cream to help with itch. To return in one week if not better

## 2011-07-04 NOTE — Progress Notes (Signed)
  Subjective:    Patient ID: Regina Sawyer, female    DOB: 26-Jan-1956, 56 y.o.   MRN: [id number]  HPI Patient presents today for itch. She initially describes vaginal itching and pressure when she pees. However on further discussion as such the rectal itching. She has tried preparation H. She recently had gastroenteritis with diarrhea and has been itching since this resolved on Sunday. She denies any odor or discharge. She denies fevers.   Review of Systems No abdominal pain, no nausea vomiting diarrhea    Objective:   Physical Exam Vital signs reviewed General appearance - alert, well appearing, and in no distress and oriented to person, place, and time Abdomen - soft, nontender, nondistended, no masses or organomegaly Rectal-externally skin tags due to hemorrhoids present. No redness or irritation. No anal fissures seen    Assessment & Plan:

## 2011-07-11 ENCOUNTER — Other Ambulatory Visit: Payer: Self-pay | Admitting: General Surgery

## 2011-07-11 DIAGNOSIS — M25532 Pain in left wrist: Secondary | ICD-10-CM

## 2011-07-12 DIAGNOSIS — M79609 Pain in unspecified limb: Secondary | ICD-10-CM | POA: Diagnosis not present

## 2011-07-15 ENCOUNTER — Ambulatory Visit
Admission: RE | Admit: 2011-07-15 | Discharge: 2011-07-15 | Disposition: A | Discharge status: Home / Self Care | Payer: Medicare Other | Source: Ambulatory Visit | Attending: General Surgery | Admitting: General Surgery

## 2011-07-15 DIAGNOSIS — M25532 Pain in left wrist: Secondary | ICD-10-CM

## 2011-07-15 DIAGNOSIS — M25539 Pain in unspecified wrist: Secondary | ICD-10-CM | POA: Diagnosis not present

## 2011-07-15 DIAGNOSIS — M7989 Other specified soft tissue disorders: Secondary | ICD-10-CM | POA: Diagnosis not present

## 2011-07-23 ENCOUNTER — Emergency Department (INDEPENDENT_AMBULATORY_CARE_PROVIDER_SITE_OTHER)
Admission: EM | Admit: 2011-07-23 | Discharge: 2011-07-23 | Disposition: A | Discharge status: Home / Self Care | Payer: Medicare Other | Source: Home / Self Care | Attending: Family Medicine | Admitting: Family Medicine

## 2011-07-23 ENCOUNTER — Encounter (HOSPITAL_COMMUNITY): Payer: Self-pay | Admitting: *Deleted

## 2011-07-23 DIAGNOSIS — H6980 Other specified disorders of Eustachian tube, unspecified ear: Secondary | ICD-10-CM | POA: Diagnosis not present

## 2011-07-23 HISTORY — DX: Major depressive disorder, single episode, unspecified: F32.9

## 2011-07-23 HISTORY — DX: Depression, unspecified: F32.A

## 2011-07-23 MED ORDER — FLUTICASONE PROPIONATE 50 MCG/ACT NA SUSP: 2.0000 | Freq: Every day | NASAL | Status: DC

## 2011-07-23 NOTE — ED Provider Notes (Signed)
History     CSN: [id number]  Arrival date & time 07/23/11  4098   First MD Initiated Contact with Patient 07/23/11 [id number]      Chief Complaint  Patient presents with  . Otalgia    (Consider location/radiation/quality/duration/timing/severity/associated sxs/prior treatment) HPI Comments: Regina Sawyer presents for evaluation of pain in her right ear over the last 4 days. She denies any injury such as loud music or cotton swabs. She denies any nasal congestion rhinorrhea or cough. She's been using ibuprofen and sweet oil drops without relief. She denies any hearing loss.  Patient is a 56 y.o. female presenting with ear pain. The history is provided by the patient.  Otalgia This is a new problem. The current episode started more than 2 days ago. There is pain in the right ear. The problem occurs constantly. The problem has not changed since onset.There has been no fever. The pain is mild. Pertinent negatives include no ear discharge, no headaches, no hearing loss, no rhinorrhea and no sore throat.    Past Medical History  Diagnosis Date  . Hypertension   . GERD (gastroesophageal reflux disease)   . Heart murmur   . Ulcer   . Depression     Past Surgical History  Procedure Date  . Carpal tunnel release     both wrists    No family history on file.  History  Substance Use Topics  . Smoking status: Never Smoker   . Smokeless tobacco: Not on file  . Alcohol Use: No    OB History    Grav Para Term Preterm Abortions TAB SAB Ect Mult Living                  Review of Systems  Constitutional: Negative.   HENT: Positive for ear pain. Negative for hearing loss, sore throat, rhinorrhea and ear discharge.   Eyes: Negative.   Respiratory: Negative.   Cardiovascular: Negative.   Gastrointestinal: Negative.   Genitourinary: Negative.   Musculoskeletal: Negative.   Skin: Negative.   Neurological: Negative.  Negative for headaches.    Allergies  Ibuprofen  Home Medications    Current Outpatient Rx  Name Route Sig Dispense Refill  . HYDROCHLOROTHIAZIDE 25 MG PO TABS Oral Take 25 mg by mouth daily.      Marland Kitchen MIRTAZAPINE 45 MG PO TABS  TAKE 1 TABLET BY MOUTH AT BEDTIME 30 tablet 5  . OMEPRAZOLE 20 MG PO CPDR  TAKE ONE CAPSULE BY MOUTH EVERY DAY 30 capsule 11  . TRAZODONE HCL 50 MG PO TABS  TAKE 1 TABLET (50 MG TOTAL) BY MOUTH AT BEDTIME AS NEEDED. 30 tablet 6  . BLOOD PRESSURE MONITOR MISC  Please provide on automatic digital BP reader and cuff.     Marland Kitchen FLUTICASONE PROPIONATE 50 MCG/ACT NA SUSP Nasal Place 2 sprays into the nose daily. 16 g 2    BP 144/81  Pulse 87  Temp(Src) 99 F (37.2 C) (Oral)  Resp 20  SpO2 98%  Physical Exam  Nursing note and vitals reviewed. Constitutional: She is oriented to person, place, and time. She appears well-developed and well-nourished.  HENT:  Head: Normocephalic and atraumatic.  Right Ear: Tympanic membrane is retracted.  Left Ear: Tympanic membrane is retracted.  Mouth/Throat: Uvula is midline, oropharynx is clear and moist and mucous membranes are normal.  Eyes: EOM are normal.  Neck: Normal range of motion.  Pulmonary/Chest: Effort normal.  Musculoskeletal: Normal range of motion.  Neurological: She is alert and oriented to  person, place, and time.  Skin: Skin is warm and dry.  Psychiatric: Her behavior is normal.    ED Course  Procedures (including critical care time)  Labs Reviewed - No data to display No results found.   1. Eustachian tube dysfunction       MDM  rx given for fluticasone        Renaee Munda, MD 07/23/11 1024

## 2011-07-23 NOTE — Discharge Instructions (Signed)
Use the prescription nasal spray as directed, and as needed. I recommend pain control with acetaminophen (Tylenol) and/or ibuprofen. You may use these together, alternating them every 4 hours, or individually, every 8 hours. For example, take acetaminophen 500 to 1000 mg at 12 noon, then 600 to 800 mg of ibuprofen at 4 pm, then acetaminophen at 8 pm, etc. Also, stay hydrated with clear liquids. Return to care should your symptoms not improve, or worsen in any way.

## 2011-07-23 NOTE — ED Notes (Signed)
C/O right earache x 4 days w/ progressive worsening.  Denies any congestion or runny nose.  Has been taking IBU and using sweet oil ear drops without relief.

## 2011-08-08 ENCOUNTER — Other Ambulatory Visit: Payer: Self-pay | Admitting: Family Medicine

## 2011-08-15 ENCOUNTER — Ambulatory Visit (INDEPENDENT_AMBULATORY_CARE_PROVIDER_SITE_OTHER): Payer: Medicare Other | Admitting: Family Medicine

## 2011-08-15 ENCOUNTER — Encounter: Payer: Self-pay | Admitting: Family Medicine

## 2011-08-15 VITALS — BP 120/72 | HR 99 | Temp 99.5°F | Ht 65.0 in | Wt 148.0 lb

## 2011-08-15 DIAGNOSIS — J069 Acute upper respiratory infection, unspecified: Secondary | ICD-10-CM | POA: Diagnosis not present

## 2011-08-15 DIAGNOSIS — J309 Allergic rhinitis, unspecified: Secondary | ICD-10-CM

## 2011-08-15 MED ORDER — FLUTICASONE PROPIONATE 50 MCG/ACT NA SUSP: 2.0000 | Freq: Every day | NASAL | Status: DC

## 2011-08-15 MED ORDER — CETIRIZINE HCL 10 MG PO TABS: 10.0000 mg | ORAL_TABLET | Freq: Every day | ORAL | Status: DC

## 2011-08-15 NOTE — Patient Instructions (Signed)
Allergic Rhinitis  Allergic rhinitis is when the mucous membranes in the nose respond to allergens. Allergens are particles in the air that cause your body to have an allergic reaction. This causes you to release allergic antibodies. Through a chain of events, these eventually cause you to release histamine into the blood stream (hence the use of antihistamines). Although meant to be protective to the body, it is this release that causes your discomfort, such as frequent sneezing, congestion and an itchy runny nose.    CAUSES    The pollen allergens may come from grasses, trees, and weeds. This is seasonal allergic rhinitis, or "hay fever." Other allergens cause year-round allergic rhinitis (perennial allergic rhinitis) such as house dust mite allergen, pet dander and mold spores.    SYMPTOMS     Nasal stuffiness (congestion).   Runny, itchy nose with sneezing and tearing of the eyes.   There is often an itching of the mouth, eyes and ears.  It cannot be cured, but it can be controlled with medications.  DIAGNOSIS    If you are unable to determine the offending allergen, skin or blood testing may find it.  TREATMENT     Avoid the allergen.   Medications and allergy shots (immunotherapy) can help.   Hay fever may often be treated with antihistamines in pill or nasal spray forms. Antihistamines block the effects of histamine. There are over-the-counter medicines that may help with nasal congestion and swelling around the eyes. Check with your caregiver before taking or giving this medicine.  If the treatment above does not work, there are many new medications your caregiver can prescribe. Stronger medications may be used if initial measures are ineffective. Desensitizing injections can be used if medications and avoidance fails. Desensitization is when a patient is given ongoing shots until the body becomes less sensitive to the allergen. Make sure you follow up with your caregiver if problems continue.  SEEK  MEDICAL CARE IF:     You develop fever (more than 100.5 F (38.1 C).   You develop a cough that does not stop easily (persistent).   You have shortness of breath.   You start wheezing.   Symptoms interfere with normal daily activities.  Document Released: 12/28/2000 Document Revised: 03/24/2011 Document Reviewed: 07/09/2008  [employer] Patient Information 2012 [employer]

## 2011-08-17 ENCOUNTER — Encounter: Payer: Self-pay | Admitting: Family Medicine

## 2011-08-17 DIAGNOSIS — J069 Acute upper respiratory infection, unspecified: Secondary | ICD-10-CM | POA: Insufficient documentation

## 2011-08-17 NOTE — Assessment & Plan Note (Signed)
Viral uri likely compounded by allergic rhinitis. Gave symptomatic tx and started back on flonase and zyrec, which she took last year.

## 2011-08-17 NOTE — Progress Notes (Signed)
  Subjective:    Patient ID: Cleaster Corin, female    DOB: 07/16/1955, 56 y.o.   MRN: [id number]  HPI  Pt with cough, congestion, ear pain and subjective fever x 1 week.  Not taken temp, but felt hot.  Took tylenol 2 days ago.  Right ear feels full and has pressure. Has allergy symptoms every year and usually takes zyrtec and flonase, but has not done that yet this year.  Tylenol helps pain but still has lots of congestion.  Review of Systems Denies CP, SOB  Objective:   Physical Exam Vital signs reviewed General appearance - alert,tired appearing, and in no distress  Heart - normal rate, regular rhythm, normal S1, S2, no murmurs, rubs, clicks or gallops Chest - clear to auscultation, no wheezes, rales or rhonchi, symmetric air entry, no tachypnea, retractions or cyanosis Eyes - no conjunctival injection Ears - right TM appears dull, with possible retraction, left normal Nose - thick white discharge present       Assessment & Plan:

## 2011-08-18 DIAGNOSIS — M25839 Other specified joint disorders, unspecified wrist: Secondary | ICD-10-CM | POA: Diagnosis not present

## 2011-08-23 ENCOUNTER — Encounter (HOSPITAL_COMMUNITY): Payer: Self-pay | Admitting: Emergency Medicine

## 2011-08-23 ENCOUNTER — Emergency Department (INDEPENDENT_AMBULATORY_CARE_PROVIDER_SITE_OTHER)
Admission: EM | Admit: 2011-08-23 | Discharge: 2011-08-23 | Disposition: A | Discharge status: Home / Self Care | Payer: Medicare Other | Source: Home / Self Care | Attending: Family Medicine | Admitting: Family Medicine

## 2011-08-23 DIAGNOSIS — J40 Bronchitis, not specified as acute or chronic: Secondary | ICD-10-CM | POA: Diagnosis not present

## 2011-08-23 DIAGNOSIS — J309 Allergic rhinitis, unspecified: Secondary | ICD-10-CM | POA: Diagnosis not present

## 2011-08-23 MED ORDER — BENZONATATE 100 MG PO CAPS: 100.0000 mg | ORAL_CAPSULE | Freq: Three times a day (TID) | ORAL | Status: AC

## 2011-08-23 MED ORDER — PREDNISONE 20 MG PO TABS: ORAL_TABLET | ORAL | Status: AC

## 2011-08-23 MED ORDER — AZITHROMYCIN 250 MG PO TABS: 250.0000 mg | ORAL_TABLET | Freq: Every day | ORAL | Status: AC

## 2011-08-23 MED ORDER — ALBUTEROL SULFATE HFA 108 (90 BASE) MCG/ACT IN AERS: 1.0000 | INHALATION_SPRAY | Freq: Four times a day (QID) | RESPIRATORY_TRACT | Status: DC | PRN

## 2011-08-23 NOTE — ED Provider Notes (Signed)
History     CSN: [id number]  Arrival date & time 08/23/11  1511   First MD Initiated Contact with Patient 08/23/11 1547      Chief Complaint  Patient presents with  . Nasal Congestion    (Consider location/radiation/quality/duration/timing/severity/associated sxs/prior treatment) HPI Comments: 56 year old female with history of hypertension, mood disorder and allergic rhinitis. Here complaining of 2 weeks history with productive cough with yellow sputum, nasal congestion and sore throat. Denies fever or chills. Decreased appetite but tolerating solids and liquids fine. States her cough is persistent and bothers her more at nighttime with coughing spells sometimes associated with wheezing, also feels  drainage coming from her nose in the back of her throat. Was recently seen by her primary care provider and diagnosed with allergic rhinitis. Patient is not using prescribed Flonase. States that she does not smoke but her husband smokes in the house and she's been exposed to secondhand smoke for years.    Past Medical History  Diagnosis Date  . Hypertension   . GERD (gastroesophageal reflux disease)   . Heart murmur   . Ulcer   . Depression     Past Surgical History  Procedure Date  . Carpal tunnel release     both wrists    No family history on file.  History  Substance Use Topics  . Smoking status: Never Smoker   . Smokeless tobacco: Not on file  . Alcohol Use: No    OB History    Grav Para Term Preterm Abortions TAB SAB Ect Mult Living                  Review of Systems  Constitutional: Positive for appetite change. Negative for fever and chills.  HENT: Positive for congestion, sore throat and rhinorrhea. Negative for neck pain.   Respiratory: Positive for cough and wheezing.   Cardiovascular: Negative for chest pain and leg swelling.  Gastrointestinal: Negative for nausea and vomiting.  Musculoskeletal: Negative for back pain and arthralgias.  Skin: Negative for  rash.  Neurological: Negative for dizziness and headaches.    Allergies  Ibuprofen  Home Medications   Current Outpatient Rx  Name Route Sig Dispense Refill  . ACETAMINOPHEN 325 MG PO TABS Oral Take 650 mg by mouth every 6 (six) hours as needed.    Marland Kitchen OVER THE COUNTER MEDICATION  Cough drops    . ALBUTEROL SULFATE HFA 108 (90 BASE) MCG/ACT IN AERS Inhalation Inhale 1-2 puffs into the lungs every 6 (six) hours as needed for wheezing. 1 Inhaler 0  . AZITHROMYCIN 250 MG PO TABS Oral Take 1 tablet (250 mg total) by mouth daily. Take first 2 tablets together, then 1 every day until finished. 6 tablet 0  . BENZONATATE 100 MG PO CAPS Oral Take 1 capsule (100 mg total) by mouth every 8 (eight) hours. 21 capsule 0  . BLOOD PRESSURE MONITOR MISC  Please provide on automatic digital BP reader and cuff.     Marland Kitchen CETIRIZINE HCL 10 MG PO TABS Oral Take 1 tablet (10 mg total) by mouth daily. 30 tablet 11  . FLUTICASONE PROPIONATE 50 MCG/ACT NA SUSP Nasal Place 2 sprays into the nose daily. 16 g 2  . HYDROCHLOROTHIAZIDE 25 MG PO TABS  TAKE 1 TABLET EVERY DAY 30 tablet 11  . MIRTAZAPINE 45 MG PO TABS  TAKE 1 TABLET BY MOUTH AT BEDTIME 30 tablet 5  . OMEPRAZOLE 20 MG PO CPDR  TAKE ONE CAPSULE BY MOUTH EVERY DAY 30  capsule 11  . PREDNISONE 20 MG PO TABS  2 tabs po daily for 5 days 10 tablet no  . TRAZODONE HCL 50 MG PO TABS  TAKE 1 TABLET (50 MG TOTAL) BY MOUTH AT BEDTIME AS NEEDED. 30 tablet 6    BP 127/75  Pulse 98  Temp(Src) 99 F (37.2 C) (Oral)  Resp 20  SpO2 99%  Physical Exam  Nursing note and vitals reviewed. Constitutional: She is oriented to person, place, and time. She appears well-developed and well-nourished. No distress.  HENT:  Head: Normocephalic and atraumatic.  Right Ear: External ear normal.  Left Ear: External ear normal.  Nose: Mucosal edema and rhinorrhea present. Right sinus exhibits no maxillary sinus tenderness and no frontal sinus tenderness. Left sinus exhibits no  maxillary sinus tenderness and no frontal sinus tenderness.  Mouth/Throat: Uvula is midline and mucous membranes are normal. Posterior oropharyngeal erythema present. No oropharyngeal exudate, posterior oropharyngeal edema or tonsillar abscesses.  Eyes: Conjunctivae are normal. Pupils are equal, round, and reactive to light.  Cardiovascular: Normal rate, regular rhythm and normal heart sounds.   Pulmonary/Chest: Breath sounds normal. No respiratory distress. She has no wheezes. She has no rales. She exhibits no tenderness.       Bronchitic cough, minimal bilateral scattered rhonchi.  Neurological: She is alert and oriented to person, place, and time.    ED Course  Procedures (including critical care time)  Labs Reviewed - No data to display No results found.   1. Bronchitis   2. Allergic rhinitis       MDM  Impress patient developing bronchitis likely viral but given the length of symptoms exposure to second hand smoke and worsening of her symptoms decided to treat with azithromycin, albuterol and prednisone. Asked patient to start using Flonase consistently and to take over-the-counter Zyrtec. Supportive care recommendations provided in writing. Asked to return if new onset of fever chest pain or difficulty breathing.        Sharin Grave, MD 08/24/11 1103

## 2011-08-23 NOTE — ED Notes (Addendum)
C/o cough and congestion.  Sore throat, worse with coughing, feels drainage in the back of throat.  Intermittent sputum production, denies fever

## 2011-08-23 NOTE — Discharge Instructions (Signed)
  Is very important top keep well hydrated. Take the prescribed medications as instructed. Avoid cigarette second hand smoke exposure. You need to start using your nasal steroid (Flonase) as previously prescribed. Can take ibuprofen over-the-counter scheduled every 8 hours for the next 24-48 hours take with food and plenty of liquids as it can upset your stomach, can alternate with Tylenol every 6 hours as needed for pain or fever. Use nasal saline spray at least 3 times a day. (simply saline is over the counter) Start the prescribed antibiotic only if no improvement of your symptoms after 48 hours. Return if difficulty breathing or not keeping fluids down.

## 2011-08-26 ENCOUNTER — Encounter: Payer: Self-pay | Admitting: Obstetrics and Gynecology

## 2011-08-29 ENCOUNTER — Encounter: Payer: Self-pay | Admitting: Obstetrics and Gynecology

## 2011-09-16 DIAGNOSIS — M25839 Other specified joint disorders, unspecified wrist: Secondary | ICD-10-CM | POA: Diagnosis not present

## 2011-10-04 ENCOUNTER — Telehealth: Payer: Self-pay | Admitting: Family Medicine

## 2011-10-04 NOTE — Telephone Encounter (Signed)
Patient is calling needing a referral to see Dr. Brandy Hale, OB/GYN [phone].

## 2011-10-05 NOTE — Telephone Encounter (Signed)
Please find out what referral is for?

## 2011-10-21 DIAGNOSIS — M25839 Other specified joint disorders, unspecified wrist: Secondary | ICD-10-CM | POA: Diagnosis not present

## 2011-10-27 ENCOUNTER — Other Ambulatory Visit: Payer: Self-pay | Admitting: Family Medicine

## 2011-10-28 ENCOUNTER — Ambulatory Visit: Payer: Medicare Other | Admitting: Family Medicine

## 2011-11-08 ENCOUNTER — Other Ambulatory Visit: Payer: Self-pay | Admitting: Family Medicine

## 2011-11-14 ENCOUNTER — Telehealth: Payer: Self-pay | Admitting: Family Medicine

## 2011-11-14 NOTE — Telephone Encounter (Signed)
Patient is calling for a Referrall to Dr. Dub Amis - [phone].  Since she has Medicaid, they need authorization.

## 2011-11-15 DIAGNOSIS — H26499 Other secondary cataract, unspecified eye: Secondary | ICD-10-CM | POA: Diagnosis not present

## 2011-11-15 NOTE — Telephone Encounter (Signed)
Asked patient to let me know why she needs to be referred. Said she may need to come in for initial visit.

## 2011-11-16 ENCOUNTER — Ambulatory Visit: Payer: Medicare Other | Admitting: Family Medicine

## 2011-11-18 ENCOUNTER — Encounter: Payer: Self-pay | Admitting: Obstetrics and Gynecology

## 2011-11-18 ENCOUNTER — Ambulatory Visit (INDEPENDENT_AMBULATORY_CARE_PROVIDER_SITE_OTHER): Payer: Medicare Other | Admitting: Obstetrics and Gynecology

## 2011-11-18 VITALS — BP 118/70 | Temp 99.2°F | Wt 144.0 lb

## 2011-11-18 DIAGNOSIS — N952 Postmenopausal atrophic vaginitis: Secondary | ICD-10-CM | POA: Diagnosis not present

## 2011-11-18 DIAGNOSIS — R350 Frequency of micturition: Secondary | ICD-10-CM | POA: Diagnosis not present

## 2011-11-18 DIAGNOSIS — K219 Gastro-esophageal reflux disease without esophagitis: Secondary | ICD-10-CM

## 2011-11-18 DIAGNOSIS — L293 Anogenital pruritus, unspecified: Secondary | ICD-10-CM | POA: Diagnosis not present

## 2011-11-18 DIAGNOSIS — N898 Other specified noninflammatory disorders of vagina: Secondary | ICD-10-CM

## 2011-11-18 DIAGNOSIS — IMO0001 Reserved for inherently not codable concepts without codable children: Secondary | ICD-10-CM

## 2011-11-18 LAB — POCT URINALYSIS DIPSTICK
Leukocytes, UA: NEGATIVE
Nitrite, UA: NEGATIVE
Protein, UA: NEGATIVE
Spec Grav, UA: 1.01
Urobilinogen, UA: NEGATIVE

## 2011-11-18 LAB — POCT WET PREP (WET MOUNT)

## 2011-11-18 MED ORDER — CLOTRIMAZOLE-BETAMETHASONE 1-0.05 % EX CREA: TOPICAL_CREAM | Freq: Two times a day (BID) | CUTANEOUS | Status: AC

## 2011-11-18 MED ORDER — ESTRADIOL 0.1 MG/GM VA CREA: 1.0000 g | TOPICAL_CREAM | Freq: Every day | VAGINAL | Status: AC

## 2011-11-18 MED ORDER — OMEPRAZOLE 20 MG PO CPDR: 20.0000 mg | DELAYED_RELEASE_CAPSULE | Freq: Every day | ORAL | Status: DC

## 2011-11-18 NOTE — Patient Instructions (Addendum)
Use   K-Y Intrigue Vaginal Moisturizer as directed  Start using Estrace Vaginal Cream 1 gm in vagina every other day for 14 days then twice weekly  Avoid: Caffeine, tomatoes, citrus juices, fried foods, peppermint, alcohol, tobacco and laying down after you eat (wait 3 hours)  Take your Omeprazole daily x 2 weeks then as needed

## 2011-11-18 NOTE — Progress Notes (Signed)
Vag. Discharge:no Odor:no Fever:no Irreg.Periods:no Dyspareunia:yes Dysuria:no Frequency:yes Urgency:yes Hematuria:no Kidney stones:no Constipation:no Diarrhea:no Rectal Bleeding: no Vomiting:no Nausea:no Pregnant:no Fibroids:no Endometriosis:no Hx of Ovarian Cyst:yes Hx IUD:no Hx STD-PID:yes Appendectomy:no Gall Bladder Dz:no

## 2011-11-18 NOTE — Progress Notes (Signed)
55 YO complains of dysuria (feels heaviness when she urinates)  and burning in stomach x 4 days.  Has also noticed itching on outside of vagina.  States that stomach symptoms are worse when she eats fried foods.  Admits to mild nausea but states that bowel movements are normal.  Has a history of peptic ulcers and GERD. Has not been taking her omeprazole regularly.   O:  U/A-negative       Wet Prep-pH-4.5,  whiff-negative,  negative  Pelvic: EGBUS-atrophic,   vagina-atrophic,  cervix-no lesions, uterus-normal size, adnexae-no masses  A: Atrophic Vulvovaginitis     GERD  P Omeprazole 20 mg #30 1 po qd x 14 days then prn      GERD precautions     Estrace Vaginal #1 tube 1 gm pv  qod x 14 days then twice weekly     K-Y Intrigue Vaginal Moisturizer use as directed     RTO-as scheduled   Eduardo Honor, PA-C

## 2011-11-30 DIAGNOSIS — Z1231 Encounter for screening mammogram for malignant neoplasm of breast: Secondary | ICD-10-CM | POA: Diagnosis not present

## 2011-12-01 ENCOUNTER — Ambulatory Visit: Payer: Self-pay | Admitting: Obstetrics and Gynecology

## 2012-01-07 ENCOUNTER — Emergency Department (INDEPENDENT_AMBULATORY_CARE_PROVIDER_SITE_OTHER)
Admission: EM | Admit: 2012-01-07 | Discharge: 2012-01-07 | Disposition: A | Discharge status: Home / Self Care | Payer: Medicare Other | Source: Home / Self Care | Attending: Emergency Medicine | Admitting: Emergency Medicine

## 2012-01-07 ENCOUNTER — Encounter (HOSPITAL_COMMUNITY): Payer: Self-pay | Admitting: Emergency Medicine

## 2012-01-07 DIAGNOSIS — H6090 Unspecified otitis externa, unspecified ear: Secondary | ICD-10-CM

## 2012-01-07 DIAGNOSIS — T169XXA Foreign body in ear, unspecified ear, initial encounter: Secondary | ICD-10-CM | POA: Diagnosis not present

## 2012-01-07 MED ORDER — NEOMYCIN-POLYMYXIN-HC 3.5-10000-1 OT SUSP: 3.0000 [drp] | Freq: Four times a day (QID) | OTIC | Status: DC

## 2012-01-07 MED ORDER — ACETAMINOPHEN-CODEINE #3 300-30 MG PO TABS: 1.0000 | ORAL_TABLET | ORAL | Status: DC | PRN

## 2012-01-07 NOTE — ED Provider Notes (Signed)
Chief Complaint  Patient presents with  . Foreign Body in Ear    cotton stuck in ear    History of Present Illness:   Regina Sawyer is a 56 year old female who has had a three-day history of itching, irritation, and pain in the right external ear canal. She has had a small amount of fluid drainage. She denies any difficulty hearing or ringing in ears. She's had no fever, chills, nasal congestion, rhinorrhea, sore throat, adenopathy, or cough. Yesterday she was trying to clean out the ear canal and got the tip of Q-tip stuck in ear canal. She tried to remove this herself with the stick from the Q-tip and only succeeded in pushing it down further.  Review of Systems:  Other than noted above, the patient denies any of the following symptoms: Systemic:  No fevers, chills, sweats, weight loss or gain, fatigue, or tiredness. Eye:  No redness, pain, discharge, itching, blurred vision, or diplopia. ENT:  No headache, nasal congestion, sneezing, itching, epistaxis, ear pain, congestion, decreased hearing, ringing in ears, vertigo, or tinnitus.  No oral lesions, sore throat, pain on swallowing, or hoarseness. Neck:  No mass, tenderness or adenopathy. Lungs:  No coughing, wheezing, or shortness of breath. Skin:  No rash or itching.  PMFSH:  Past medical history, family history, social history, meds, and allergies were reviewed.  Physical Exam:   Vital signs:  BP 137/75  Pulse 83  Temp 98.6 F (37 C) (Oral)  Resp 17  SpO2 100% General:  Alert and oriented.  In no distress.  Skin warm and dry. Eye:  PERRL, full EOMs, lids and conjunctiva normal.   ENT:  The right ear canal was inflamed and swollen. There was a small amount of whitish debris. The cotton tip of a Q-tip was lying in ear canal adjacent to the tympanic membrane. The left ear canal and TM are normal.  Nasal mucosa not congested and without drainage.  Mucous membranes moist, no oral lesions, normal dentition, pharynx clear.  No cranial or facial  pain to palplation. Neck:  Supple, full ROM.  No adenopathy, tenderness or mass.  Thyroid normal. Skin:  Clear, warm and dry.  Course in Urgent Care Center:   The ear canal was irrigated with warm water and the Q-tip eventually came out. Thereafter the canal was noted to be still irritated and inflamed. The TM was normal.   Assessment:  The primary encounter diagnosis was Foreign body in ear. A diagnosis of Otitis externa was also pertinent to this visit.  Plan:   1.  The following meds were prescribed:   New Prescriptions   ACETAMINOPHEN-CODEINE (TYLENOL #3) 300-30 MG PER TABLET    Take 1-2 tablets by mouth every 4 (four) hours as needed for pain.   NEOMYCIN-POLYMYXIN-HYDROCORTISONE (CORTISPORIN) 3.5-10000-1 OTIC SUSPENSION    Place 3 drops into the right ear 4 (four) times daily.   2.  The patient was instructed in symptomatic care and handouts were given. 3.  The patient was told to return if becoming worse in any way, if no better in 3 or 4 days, and given some red flag symptoms that would indicate earlier return.     Reuben Likes, MD 01/07/12 [phone]

## 2012-01-07 NOTE — ED Notes (Addendum)
Pt c/o of cotton stuck in ear. Pt states she was cleaning ear with q-tip and the cotton came off the end. Pt having pain in right ear.

## 2012-01-10 ENCOUNTER — Ambulatory Visit (INDEPENDENT_AMBULATORY_CARE_PROVIDER_SITE_OTHER): Payer: Medicare Other | Admitting: Obstetrics and Gynecology

## 2012-01-10 ENCOUNTER — Encounter: Payer: Self-pay | Admitting: Obstetrics and Gynecology

## 2012-01-10 VITALS — BP 112/72 | Resp 14 | Ht 62.0 in | Wt 152.0 lb

## 2012-01-10 DIAGNOSIS — Z01419 Encounter for gynecological examination (general) (routine) without abnormal findings: Secondary | ICD-10-CM | POA: Diagnosis not present

## 2012-01-10 DIAGNOSIS — Z124 Encounter for screening for malignant neoplasm of cervix: Secondary | ICD-10-CM

## 2012-01-10 DIAGNOSIS — N952 Postmenopausal atrophic vaginitis: Secondary | ICD-10-CM

## 2012-01-10 MED ORDER — ESTRADIOL 10 MCG VA TABS: 10.0000 ug | ORAL_TABLET | VAGINAL | Status: DC

## 2012-01-10 NOTE — Progress Notes (Signed)
Subjective:    Regina Sawyer is a 56 y.o. female G2P2002 who presents for annual exam. The patient complains of vaginal  dryness  and pain with intercourse. She does not like the estrogen vaginal cream because it is messy.  She continues to grieve the death of her parents last year. The patient's mother had osteoporosis.  The following portions of the patient's history were reviewed and updated as appropriate: allergies, current medications, past family history, past medical history, past social history, past surgical history and problem list.  Review of Systems Pertinent items are noted in HPI. Gastrointestinal:No change in bowel habits, no abdominal pain, no rectal bleeding Genitourinary:negative for dysuria, frequency, hematuria, nocturia and urinary incontinence    Objective:     BP 112/72  Resp 14  Ht 5\' 2"  (1.575 m)  Wt 152 lb (68.947 kg)  BMI 27.80 kg/m2  Weight:  Wt Readings from Last 1 Encounters:  01/10/12 152 lb (68.947 kg)     BMI: Body mass index is 27.80 kg/(m^2). General Appearance: Alert, appropriate appearance for age. No acute distress HEENT: Grossly normal, unequal gaze Neck / Thyroid: Supple, no masses, nodes or enlargement Lungs: clear to auscultation bilaterally Back: No CVA tenderness Breast Exam: No masses or nodes.No dimpling, nipple retraction or discharge. Cardiovascular: Regular rate and rhythm. S1, S2, no murmur Gastrointestinal: Soft, non-tender, no masses or organomegaly  ++++++++++++++++++++++++++++++++++++++++++++++++++++++++  Pelvic Exam: External genitalia: normal general appearance Vaginal: normal without tenderness, induration or masses, atrophic mucosa and relaxation noted Cervix: flush with the vaginal apex Adnexa: normal bimanual exam Uterus: nontender, no masses Rectovaginal: normal rectal, no masses  ++++++++++++++++++++++++++++++++++++++++++++++++++++++++  Lymphatic Exam: Non-palpable nodes in neck, clavicular, axillary, or inguinal  regions  Psychiatric: Alert and oriented, appropriate affect.@OBJECTIVEEN @      Assessment:    Normal gyn exam Menopause atrophic vaginal changes   Overweight or obese: No  Pelvic relaxation: Yes  Menopausal symptoms: Yes. Severe: Yes.  Family history of osteoporosis   Plan:    Mammogram. Pap smear.   Follow-up:  for annual exam  The patient requested that I obtain a Pap smear: Yes  Proper diet and regular exercise were reviewed.  Annual mammograms recommended starting at age 87. Proper breast care was discussed.  Screening colonoscopy is recommended beginning at age 81.  Regular health maintenance was reviewed.  Sleep hygiene was discussed.  Vagifem 10 g twice each week in the vagina.  Bone density scan because of family history of osteoporosis.  Leonard Schwartz M.D.   Regular Periods: no Mammogram: yes "12/2011" WNL  Monthly Breast Ex.: no Exercise: no  Tetanus < 10 years: no Seatbelts: yes  NI. Bladder Functn.: yes Abuse at home: no  Daily BM's: yes Stressful Work: yes  Healthy Diet: yes Sigmoid-Colonoscopy: pt cannot recall pt states she is due for one now.   Calcium: yes Medical problems this year: N/A   LAST PAP:06/21/2011  Contraception: BTL   Mammogram:  12/2011 WNL  PCP: [facility]  PMH: No Changes  FMH: No Changes  Last Bone Scan: Never.

## 2012-01-12 LAB — PAP IG W/ RFLX HPV ASCU

## 2012-01-16 ENCOUNTER — Telehealth: Payer: Self-pay

## 2012-01-16 NOTE — Telephone Encounter (Signed)
LM on VM for pt to call back to schedule Bone Density Scan.  [facility] CMA

## 2012-02-02 ENCOUNTER — Other Ambulatory Visit: Payer: Self-pay | Admitting: Family Medicine

## 2012-02-02 NOTE — Telephone Encounter (Signed)
Needs appointment for more refills.

## 2012-02-23 ENCOUNTER — Encounter: Payer: Self-pay | Admitting: Obstetrics and Gynecology

## 2012-02-23 ENCOUNTER — Ambulatory Visit (INDEPENDENT_AMBULATORY_CARE_PROVIDER_SITE_OTHER): Payer: Medicare Other | Admitting: Obstetrics and Gynecology

## 2012-02-23 VITALS — BP 112/70 | Temp 99.5°F | Wt 154.0 lb

## 2012-02-23 DIAGNOSIS — N898 Other specified noninflammatory disorders of vagina: Secondary | ICD-10-CM

## 2012-02-23 DIAGNOSIS — L293 Anogenital pruritus, unspecified: Secondary | ICD-10-CM

## 2012-02-23 DIAGNOSIS — R350 Frequency of micturition: Secondary | ICD-10-CM

## 2012-02-23 DIAGNOSIS — R8281 Pyuria: Secondary | ICD-10-CM

## 2012-02-23 DIAGNOSIS — R82998 Other abnormal findings in urine: Secondary | ICD-10-CM | POA: Diagnosis not present

## 2012-02-23 DIAGNOSIS — Z719 Counseling, unspecified: Secondary | ICD-10-CM

## 2012-02-23 LAB — POCT WET PREP (WET MOUNT)

## 2012-02-23 LAB — POCT URINALYSIS DIPSTICK: pH, UA: 5

## 2012-02-23 MED ORDER — FLUCONAZOLE 150 MG PO TABS: 150.0000 mg | ORAL_TABLET | Freq: Once | ORAL | Status: DC

## 2012-02-23 MED ORDER — NITROFURANTOIN MONOHYD MACRO 100 MG PO CAPS: 100.0000 mg | ORAL_CAPSULE | Freq: Two times a day (BID) | ORAL | Status: AC

## 2012-02-23 NOTE — Patient Instructions (Signed)
Urinary Tract Infection Urinary tract infections (UTIs) can develop anywhere along your urinary tract. Your urinary tract is your body's drainage system for removing wastes and extra water. Your urinary tract includes two kidneys, two ureters, a bladder, and a urethra. Your kidneys are a pair of bean-shaped organs. Each kidney is about the size of your fist. They are located below your ribs, one on each side of your spine. CAUSES Infections are caused by microbes, which are microscopic organisms, including fungi, viruses, and bacteria. These organisms are so small that they can only be seen through a microscope. Bacteria are the microbes that most commonly cause UTIs. SYMPTOMS  Symptoms of UTIs may vary by age and gender of the patient and by the location of the infection. Symptoms in young women typically include a frequent and intense urge to urinate and a painful, burning feeling in the bladder or urethra during urination. Older women and men are more likely to be tired, shaky, and weak and have muscle aches and abdominal pain. A fever may mean the infection is in your kidneys. Other symptoms of a kidney infection include pain in your back or sides below the ribs, nausea, and vomiting. DIAGNOSIS To diagnose a UTI, your caregiver will ask you about your symptoms. Your caregiver also will ask to provide a urine sample. The urine sample will be tested for bacteria and white blood cells. White blood cells are made by your body to help fight infection. TREATMENT  Typically, UTIs can be treated with medication. Because most UTIs are caused by a bacterial infection, they usually can be treated with the use of antibiotics. The choice of antibiotic and length of treatment depend on your symptoms and the type of bacteria causing your infection. HOME CARE INSTRUCTIONS  If you were prescribed antibiotics, take them exactly as your caregiver instructs you. Finish the medication even if you feel better after you  have only taken some of the medication.  Drink enough water and fluids to keep your urine clear or pale yellow.  Avoid caffeine, tea, and carbonated beverages. They tend to irritate your bladder.  Empty your bladder often. Avoid holding urine for long periods of time.  Empty your bladder before and after sexual intercourse.  After a bowel movement, women should cleanse from front to back. Use each tissue only once. SEEK MEDICAL CARE IF:   You have back pain.  You develop a fever.  Your symptoms do not begin to resolve within 3 days. SEEK IMMEDIATE MEDICAL CARE IF:   You have severe back pain or lower abdominal pain.  You develop chills.  You have nausea or vomiting.  You have continued burning or discomfort with urination. MAKE SURE YOU:   Understand these instructions.  Will watch your condition.  Will get help right away if you are not doing well or get worse. Document Released: 01/12/2005 Document Revised: 10/04/2011 Document Reviewed: 05/13/2011 [employer] Patient Information 2013 [employer].  

## 2012-02-23 NOTE — Progress Notes (Addendum)
56 YO for vaginitis and urinary tract symptoms.  Admits to mild back pain but no fever, vaginal odor or bleeding.  O: Abdomen: soft, non-tender;  no CVA tenderness      Pelvic: EGBUS-wnl, vagina-atrophic, uterus/cervix/adnexae-normal  Wet prep: pH-4.5,  whiff-negative, no clue,yeast or tricomoniasis  U/A- SG-1.015, pH-5.0,  leukocytes-3+, protein-trace  A: UTI  P: Macrobid     Diflucan 150 mg #1  1 po stat 1 refill     Stressed the need to drink water and to increase overall fluid intake    (patient doesn't drink water and only consumes 3-4 beverages a day)      RTO-as scheduled or prn  Lynita Groseclose, PA-C

## 2012-02-23 NOTE — Progress Notes (Signed)
Contraception: tubal ligation History of STD:  herpes History of ovarian cyst: no History of fibroids: no History of endometriosis:no Previous ultrasound:no  Urinary symptoms: urinary frequency Gastro-intestinal symptoms:  Constipation: no     Diarrhea: no     Nausea: yes     Vomiting: no     Fever: no Vaginal discharge: no vaginal discharge

## 2012-02-25 LAB — URINE CULTURE: Colony Count: 75000

## 2012-03-19 ENCOUNTER — Encounter (HOSPITAL_COMMUNITY): Payer: Self-pay

## 2012-03-19 ENCOUNTER — Emergency Department (INDEPENDENT_AMBULATORY_CARE_PROVIDER_SITE_OTHER)
Admission: EM | Admit: 2012-03-19 | Discharge: 2012-03-19 | Disposition: A | Discharge status: Home / Self Care | Payer: Medicare Other | Source: Home / Self Care

## 2012-03-19 DIAGNOSIS — J069 Acute upper respiratory infection, unspecified: Secondary | ICD-10-CM | POA: Diagnosis not present

## 2012-03-19 DIAGNOSIS — J9801 Acute bronchospasm: Secondary | ICD-10-CM | POA: Diagnosis not present

## 2012-03-19 DIAGNOSIS — J45909 Unspecified asthma, uncomplicated: Secondary | ICD-10-CM

## 2012-03-19 MED ORDER — ALBUTEROL SULFATE HFA 108 (90 BASE) MCG/ACT IN AERS: 2.0000 | INHALATION_SPRAY | RESPIRATORY_TRACT | Status: DC | PRN

## 2012-03-19 MED ORDER — PHENYLEPHRINE-CHLORPHEN-DM 10-4-12.5 MG/5ML PO LIQD: 5.0000 mL | ORAL | Status: DC | PRN

## 2012-03-19 NOTE — ED Provider Notes (Signed)
Medical screening examination/treatment/procedure(s) were performed by non-physician practitioner and as supervising physician I was immediately available for consultation/collaboration.  Leslee Home, M.D.   Reuben Likes, MD 03/19/12 [phone]

## 2012-03-19 NOTE — ED Provider Notes (Signed)
History     CSN: [id number]  Arrival date & time 03/19/12  0805   None     Chief Complaint  Patient presents with  . URI    (Consider location/radiation/quality/duration/timing/severity/associated sxs/prior treatment) HPI Comments: 56 year old female with coughing for one week. She is also complaining of PND and earaches. She denies sore throat now although she had a sore throat one week ago which self abated. She has no shortness of breath, chest pain, abdominal pain or GI, GU symptoms.   Past Medical History  Diagnosis Date  . Hypertension   . GERD (gastroesophageal reflux disease)   . Heart murmur   . Ulcer   . Depression   . History of bacterial infection   . Sickle cell trait   . Tenderness of female pelvic organs 2005  . H/O vaginitis 2005  . Fibroid 2005  . BV (bacterial vaginosis) 2005  . Candida vaginitis 2006  . Libido, decreased 2006  . Female pelvic pain 2006  . Dysuria 2007  . Irregular periods/menstrual cycles 2007  . Menopausal symptoms 2007  . Hx: UTI (urinary tract infection) 2007  . H/O dyspareunia 2008  . Proteinuria 2009  . Nocturia 2009  . Candida vaginitis 2009  . Atrophic vaginitis 2009  . H/O constipation 2009  . History of hemorrhoids 2009  . Vaginal irritation 2011  . H/O urinary frequency 2011    Past Surgical History  Procedure Date  . Carpal tunnel release     both wrists  . Tubal ligation     Family History  Problem Relation Age of Onset  . Diabetes Sister   . Hypertension Sister   . Diabetes Other     History  Substance Use Topics  . Smoking status: Never Smoker   . Smokeless tobacco: Never Used  . Alcohol Use: No    OB History    Grav Para Term Preterm Abortions TAB SAB Ect Mult Living   2 2 2  0 0 0 0 0 0 2      Review of Systems  Constitutional: Positive for fever. Negative for chills, activity change, appetite change and fatigue.  HENT: Positive for congestion, sore throat, rhinorrhea, voice change and  postnasal drip. Negative for facial swelling, trouble swallowing, neck pain and neck stiffness.   Eyes: Negative.   Respiratory: Negative.   Cardiovascular: Negative.   Gastrointestinal: Negative.   Genitourinary: Negative.   Musculoskeletal:       Rib pain due to frequent cough.  Skin: Negative for pallor and rash.  Neurological: Negative.     Allergies  Ibuprofen and Latex  Home Medications   Current Outpatient Rx  Name  Route  Sig  Dispense  Refill  . ALBUTEROL SULFATE HFA 108 (90 BASE) MCG/ACT IN AERS   Inhalation   Inhale 2 puffs into the lungs every 4 (four) hours as needed for wheezing.   1 Inhaler   0   . BLOOD PRESSURE MONITOR MISC      Please provide on automatic digital BP reader and cuff.          Marland Kitchen CLOTRIMAZOLE-BETAMETHASONE 1-0.05 % EX CREA   Topical   Apply topically 2 (two) times daily. for itching   30 g   0   . ESTRADIOL 0.1 MG/GM VA CREA   Vaginal   Place 0.25 Applicatorfuls vaginally daily.   42.5 g   12   . ESTRADIOL 10 MCG VA TABS   Vaginal   Place 1 tablet (10 mcg  total) vaginally 2 (two) times a week.   8 tablet   11   . FLUCONAZOLE 150 MG PO TABS   Oral   Take 1 tablet (150 mg total) by mouth once.   1 tablet   1   . HYDROCHLOROTHIAZIDE 25 MG PO TABS      TAKE 1 TABLET EVERY DAY   30 tablet   11   . MIRTAZAPINE 45 MG PO TABS      TAKE 1 TABLET BY MOUTH AT BEDTIME   30 tablet   0   . OMEPRAZOLE 20 MG PO CPDR      TAKE ONE CAPSULE BY MOUTH EVERY DAY   30 capsule   9   . OMEPRAZOLE 20 MG PO CPDR   Oral   Take 1 capsule (20 mg total) by mouth daily.   30 capsule   4   . PHENYLEPHRINE-CHLORPHEN-DM 01-20-11.5 MG/5ML PO LIQD   Oral   Take 5 mLs by mouth every 4 (four) hours as needed.   120 mL   0   . TRAZODONE HCL 50 MG PO TABS      TAKE 1 TABLET (50 MG TOTAL) BY MOUTH AT BEDTIME AS NEEDED.   30 tablet   0     BP 129/81  Pulse 104  Temp 99.3 F (37.4 C) (Oral)  Resp 20  SpO2 97%  Physical Exam    Nursing note and vitals reviewed. Constitutional: She is oriented to person, place, and time. She appears well-developed and well-nourished. No distress.  HENT:  Right Ear: External ear normal.  Left Ear: External ear normal.       Oropharynx is clear, moist with clear PND Bilateral TMs are normal  Neck: Normal range of motion. Neck supple.  Cardiovascular: Normal rate and regular rhythm.   Pulmonary/Chest: Effort normal and breath sounds normal. No respiratory distress.  Musculoskeletal: Normal range of motion. She exhibits no edema.  Lymphadenopathy:    She has no cervical adenopathy.  Neurological: She is alert and oriented to person, place, and time.  Skin: Skin is warm and dry. No rash noted.  Psychiatric: She has a normal mood and affect.    ED Course  Procedures (including critical care time)  Labs Reviewed - No data to display No results found.   1. URI (upper respiratory infection)   2. Bronchospasm   3. Asthma       MDM  Norell CS 1/2-1 teaspoon every 4 hours when necessary cough and congestion; may cause drowsiness Albuterol HFA 2 puffs every 4 hours when necessary cough and wheeze Drink plenty of fluids stay well hydrated  Out of work today and tomorrow       Hayden Rasmussen, NP 03/19/12 [phone]

## 2012-03-19 NOTE — ED Notes (Signed)
URI type syx for 1 week, facial pain , ear pain; yellow secretions

## 2012-03-23 ENCOUNTER — Encounter: Payer: Self-pay | Admitting: Family Medicine

## 2012-03-23 ENCOUNTER — Ambulatory Visit (INDEPENDENT_AMBULATORY_CARE_PROVIDER_SITE_OTHER): Payer: Medicare Other | Admitting: Family Medicine

## 2012-03-23 VITALS — BP 126/81 | HR 94 | Temp 99.5°F | Ht 65.0 in | Wt 151.7 lb

## 2012-03-23 DIAGNOSIS — J069 Acute upper respiratory infection, unspecified: Secondary | ICD-10-CM | POA: Diagnosis not present

## 2012-03-23 MED ORDER — FLUTICASONE PROPIONATE 50 MCG/ACT NA SUSP: 2.0000 | Freq: Every day | NASAL | Status: DC

## 2012-03-23 MED ORDER — BENZONATATE 100 MG PO CAPS: 100.0000 mg | ORAL_CAPSULE | Freq: Two times a day (BID) | ORAL | Status: DC | PRN

## 2012-03-23 NOTE — Progress Notes (Signed)
  Subjective:    Patient ID: Cleaster Corin, female    DOB: [birth year], 56 y.o.   MRN: [id number]  HPI Patient here for acute visit of cough  Seen at urgent care 4 days ago for cough, gave albuterol and encouraged fluids Onset about 1 week ago, productive of green sputum, very frequent.  Has some wheezing, denies dyspnea, subjective fevers Also has sore throat and ear pain. + sick contacts - daughter and husband who have improved.  Does not understand how to use inhaler, so hasn't used it in 2 days, desires a spacer States that the cough syrup they gave her gives her diarrhea.    Review of Systems  + fever, No chills No chest pain No dyspnea, + cough as above No nausea/vomiting, + diarrhea No dysuria No muscle pains No swelling     Objective:   Physical Exam  Gen: NAD, alert, cooperative with exam HEENT: NCAT, EOMI but R eye deviated to R and with some right beating nystagmus. BL TMs WNL Neck: Full ROM and no tender cervical LAD.  CV: RRR, good S1/S2, 3-4/6 syst murmur Resp: CTABL, no wheezes, non-labored, + cough occasionally Ext: No edema  Neuro: Alert and oriented, No gross deficits    Assessment & Plan:

## 2012-03-23 NOTE — Assessment & Plan Note (Signed)
Viral URI exacerbating possible underlying asthma, Likely also has an allergic component.  No Red flags, no wheezing or labored breathing on exam, afebrile, non toxic appearing.   Unable to use albuterol currently, as she is mis-understanding how to do it, I tried teaching her multiple times unsuccessfully.   Albuterol Q6 PRN - prescribed spacer Tessalon Pearls BID PRN Flonase and Zyrtec qd RTC in 1 week if no improvement

## 2012-03-23 NOTE — Patient Instructions (Addendum)
Thanks for coming in today  Start using this flonase and zyrtec daily. Zyrtec is over the counter, the pharmacy can help you find it.   Tessalon pearls will help your cough.  Also use the spacer with the inhaler, which will help your cough. 2 puffs every 6 hours as you need it.   Come back in 1 week if you have not improved, seek immediate medical help if you have difficulty breathing.

## 2012-03-28 ENCOUNTER — Ambulatory Visit: Payer: Medicare Other | Admitting: Family Medicine

## 2012-04-12 ENCOUNTER — Ambulatory Visit: Payer: Medicare Other | Admitting: Family Medicine

## 2012-04-17 ENCOUNTER — Emergency Department (INDEPENDENT_AMBULATORY_CARE_PROVIDER_SITE_OTHER)
Admission: EM | Admit: 2012-04-17 | Discharge: 2012-04-17 | Disposition: A | Discharge status: Home / Self Care | Payer: Medicare Other | Source: Home / Self Care

## 2012-04-17 ENCOUNTER — Encounter (HOSPITAL_COMMUNITY): Payer: Self-pay | Admitting: *Deleted

## 2012-04-17 ENCOUNTER — Emergency Department (INDEPENDENT_AMBULATORY_CARE_PROVIDER_SITE_OTHER): Payer: Medicare Other

## 2012-04-17 DIAGNOSIS — J069 Acute upper respiratory infection, unspecified: Secondary | ICD-10-CM

## 2012-04-17 DIAGNOSIS — R091 Pleurisy: Secondary | ICD-10-CM | POA: Diagnosis not present

## 2012-04-17 DIAGNOSIS — J9801 Acute bronchospasm: Secondary | ICD-10-CM

## 2012-04-17 DIAGNOSIS — J45909 Unspecified asthma, uncomplicated: Secondary | ICD-10-CM | POA: Diagnosis not present

## 2012-04-17 MED ORDER — HYDROCODONE-HOMATROPINE 5-1.5 MG/5ML PO SYRP: 2.5000 mL | ORAL_SOLUTION | ORAL | Status: DC | PRN

## 2012-04-17 MED ORDER — ALBUTEROL SULFATE (5 MG/ML) 0.5% IN NEBU: 5.0000 mg | INHALATION_SOLUTION | Freq: Once | RESPIRATORY_TRACT | Status: DC

## 2012-04-17 MED ORDER — AEROCHAMBER MV MISC: Status: DC

## 2012-04-17 NOTE — ED Notes (Signed)
Was seen in [facility] 12/2 for URI sxs - was given albuterol HFA and Rx cough med - has been using inhaler prn and states cough med never helped.  Saw PCP 12/6 for same sxs - was given Flonase (pt not taking) and Tessalon.  Continues to have severe coughing fits that cause her to vomit.  Unsure if fevers.  Denies any pain.

## 2012-04-17 NOTE — ED Provider Notes (Signed)
History     CSN: [id number]  Arrival date & time 04/17/12  1000   None     Chief Complaint  Patient presents with  . Cough  . Wheezing    (Consider location/radiation/quality/duration/timing/severity/associated sxs/prior treatment) HPI Comments: Third visit for this 56 year old female with cough and wheeze this month. She states she has taken all the medications over-the-counter she can for her cough and is not getting any better. She admits that she is not using her inhaler and she does not know how to use her inhaler. She is requesting an AeroChamber. I suspect this is a culprit for the persistent coughing. She also complaining of upper respiratory congestion and minor sore throat.   Past Medical History  Diagnosis Date  . Hypertension   . GERD (gastroesophageal reflux disease)   . Heart murmur   . Ulcer   . Depression   . History of bacterial infection   . Sickle cell trait   . Tenderness of female pelvic organs 2005  . H/O vaginitis 2005  . Fibroid 2005  . BV (bacterial vaginosis) 2005  . Candida vaginitis 2006  . Libido, decreased 2006  . Female pelvic pain 2006  . Dysuria 2007  . Irregular periods/menstrual cycles 2007  . Menopausal symptoms 2007  . Hx: UTI (urinary tract infection) 2007  . H/O dyspareunia 2008  . Proteinuria 2009  . Nocturia 2009  . Candida vaginitis 2009  . Atrophic vaginitis 2009  . H/O constipation 2009  . History of hemorrhoids 2009  . Vaginal irritation 2011  . H/O urinary frequency 2011    Past Surgical History  Procedure Date  . Carpal tunnel release     both wrists  . Tubal ligation     Family History  Problem Relation Age of Onset  . Diabetes Sister   . Hypertension Sister   . Diabetes Other     History  Substance Use Topics  . Smoking status: Passive Smoke Exposure - Never Smoker  . Smokeless tobacco: Never Used  . Alcohol Use: No    OB History    Grav Para Term Preterm Abortions TAB SAB Ect Mult Living   2 2  2  0 0 0 0 0 0 2      Review of Systems  Constitutional: Positive for fever. Negative for chills, activity change and appetite change.  HENT: Positive for congestion, rhinorrhea and postnasal drip. Negative for facial swelling, neck pain and neck stiffness.   Eyes: Negative.   Respiratory: Negative.   Cardiovascular: Negative.   Skin: Negative for pallor and rash.  Neurological: Negative.     Allergies  Ibuprofen and Latex  Home Medications   Current Outpatient Rx  Name  Route  Sig  Dispense  Refill  . ALBUTEROL SULFATE HFA 108 (90 BASE) MCG/ACT IN AERS   Inhalation   Inhale 2 puffs into the lungs every 4 (four) hours as needed for wheezing.   1 Inhaler   0   . BENZONATATE 100 MG PO CAPS   Oral   Take 1 capsule (100 mg total) by mouth 2 (two) times daily as needed for cough.   30 capsule   0   . CLOTRIMAZOLE-BETAMETHASONE 1-0.05 % EX CREA   Topical   Apply topically 2 (two) times daily. for itching   30 g   0   . HYDROCHLOROTHIAZIDE 25 MG PO TABS      TAKE 1 TABLET EVERY DAY   30 tablet   11   .  MIRTAZAPINE 45 MG PO TABS      TAKE 1 TABLET BY MOUTH AT BEDTIME   30 tablet   0   . OMEPRAZOLE 20 MG PO CPDR   Oral   Take 1 capsule (20 mg total) by mouth daily.   30 capsule   4   . BLOOD PRESSURE MONITOR MISC      Please provide on automatic digital BP reader and cuff.          Marland Kitchen ESTRADIOL 0.1 MG/GM VA CREA   Vaginal   Place 0.25 Applicatorfuls vaginally daily.   42.5 g   12   . ESTRADIOL 10 MCG VA TABS   Vaginal   Place 1 tablet (10 mcg total) vaginally 2 (two) times a week.   8 tablet   11   . FLUCONAZOLE 150 MG PO TABS   Oral   Take 1 tablet (150 mg total) by mouth once.   1 tablet   1   . FLUTICASONE PROPIONATE 50 MCG/ACT NA SUSP   Nasal   Place 2 sprays into the nose daily.   16 g   6   . HYDROCODONE-HOMATROPINE 5-1.5 MG/5ML PO SYRP   Oral   Take 2.5 mLs by mouth every 4 (four) hours as needed for cough.   120 mL   0   .  OMEPRAZOLE 20 MG PO CPDR      TAKE ONE CAPSULE BY MOUTH EVERY DAY   30 capsule   9   . AEROCHAMBER MV MISC      Use as instructed   1 each   2   . TRAZODONE HCL 50 MG PO TABS      TAKE 1 TABLET (50 MG TOTAL) BY MOUTH AT BEDTIME AS NEEDED.   30 tablet   0     BP 119/83  Pulse 97  Temp 100 F (37.8 C) (Oral)  Resp 20  SpO2 98%  Physical Exam  Constitutional: She is oriented to person, place, and time. She appears well-developed and well-nourished. No distress.  HENT:       Bilateral TMs are normal Oropharynx is moist with minimal erythema. Positive for PND.  Eyes: Conjunctivae normal and EOM are normal.  Neck: Normal range of motion. Neck supple.  Cardiovascular: Normal rate and regular rhythm.   Pulmonary/Chest: Effort normal. No respiratory distress. She has wheezes. She has no rales.       Air movement is generally good. Mild expiratory wheezes. No rhonchi or rales.  Musculoskeletal: Normal range of motion. She exhibits no edema.  Lymphadenopathy:    She has no cervical adenopathy.  Neurological: She is alert and oriented to person, place, and time.  Skin: Skin is warm and dry. No rash noted.  Psychiatric: She has a normal mood and affect.    ED Course  Procedures (including critical care time)  Labs Reviewed - No data to display Dg Chest 2 View  04/17/2012  *RADIOLOGY REPORT*  Clinical Data: Cough, wheezing and fever off and on for 4 weeks. Nonsmoker.  Asthma.  CHEST - 2 VIEW  Comparison: 02/02/2004.  Findings: No infiltrate, congestive heart failure or pneumothorax.  Biapical pleural thickening without associated bony destruction.  Heart size within normal limits.  IMPRESSION: No infiltrate or congestive heart failure.   Original Report Authenticated By: Lacy Duverney, M.D.      1. URI (upper respiratory infection)   2. Bronchospasm       MDM  Chest x-ray is unremarkable and no  signs of acute infection. The patient appears well in the office and in no  acute distress. She is not having any trouble breathing and has no cough while I was in the room. She has quite a bit of posterior pharyngeal drainage and minor wheeze. At that point she is able to utilize her inhaler via AeroChamber she will improve her cough. They have insisted on getting a stronger cough medicines are given her Hycodan at a low dose. Call her physician for followup possibly in 4 days.         Hayden Rasmussen, NP 04/17/12 1144

## 2012-04-19 NOTE — ED Provider Notes (Signed)
Medical screening examination/treatment/procedure(s) were performed by resident physician or non-physician practitioner and as supervising physician I was immediately available for consultation/collaboration.   Barkley Bruns MD.    Linna Hoff, MD 04/19/12 2039

## 2012-04-23 ENCOUNTER — Ambulatory Visit (INDEPENDENT_AMBULATORY_CARE_PROVIDER_SITE_OTHER): Payer: Medicare Other | Admitting: Family Medicine

## 2012-04-23 ENCOUNTER — Encounter: Payer: Self-pay | Admitting: Family Medicine

## 2012-04-23 VITALS — BP 117/64 | HR 55 | Temp 98.4°F | Ht 65.0 in | Wt 148.0 lb

## 2012-04-23 DIAGNOSIS — J209 Acute bronchitis, unspecified: Secondary | ICD-10-CM | POA: Diagnosis not present

## 2012-04-23 NOTE — Progress Notes (Signed)
  Subjective:    Patient ID: Regina Sawyer, female    DOB: 04/24/1955, 57 y.o.   MRN: [id number]  HPI Cough Persistent since seen in ER on 12/31.  Taking hycodan which does not help.  Has not used albuterol for more than 24 hours.  Scant yellow sputum.  She is not sure if wheezing or not.  No edema or orthopnea or fever or nausea or vomiting  PMH - chest xray on 12/31 no infiltrate   Review of Systems     Objective:   Physical Exam Alert no acute distress frequent dry cough Lungs:  Normal respiratory effort, chest expands symmetrically. Lungs are clear to auscultation, no crackles or wheezes. Frequent nonproductive cough Heart - Regular rate and rhythm.  No murmurs, gallops or rubs.    Extremities:  No cyanosis, edema, or deformity noted with good range of motion of all major joints.   Skin:  Intact without suspicious lesions or rashes        Assessment & Plan:

## 2012-04-23 NOTE — Assessment & Plan Note (Signed)
Presentation most consistent with viral bronchitis.  No signs of pneumonia or significant bronchospasm.   Cough not responsive to narcotic cough suppressant.  Suggest regular bronchodilator use and cautioned on how long will take to resolve. If persists may need eventual PFTs

## 2012-04-23 NOTE — Patient Instructions (Addendum)
Use your albuterol inhaler with spacer 2 puffs every 6 hours every day for one week at least  Stay away from smoke or dust or fumes  This can take up to 6 weeks to go away.  If you are still coughing then come back  Use a humidifer in your room where you sleep  If any high fevers or shortness of breath then come back

## 2012-04-26 ENCOUNTER — Ambulatory Visit: Payer: Medicare Other | Admitting: Family Medicine

## 2012-05-04 ENCOUNTER — Ambulatory Visit (INDEPENDENT_AMBULATORY_CARE_PROVIDER_SITE_OTHER): Payer: Medicare Other | Admitting: Family Medicine

## 2012-05-04 VITALS — BP 128/78 | HR 90 | Temp 98.8°F | Ht 65.0 in | Wt 148.0 lb

## 2012-05-04 DIAGNOSIS — J329 Chronic sinusitis, unspecified: Secondary | ICD-10-CM

## 2012-05-04 MED ORDER — AMOXICILLIN-POT CLAVULANATE 875-125 MG PO TABS: 1.0000 | ORAL_TABLET | Freq: Two times a day (BID) | ORAL | Status: DC

## 2012-05-04 MED ORDER — HYDROCODONE-HOMATROPINE 5-1.5 MG/5ML PO SYRP: 2.5000 mL | ORAL_SOLUTION | ORAL | Status: DC | PRN

## 2012-05-04 MED ORDER — TRAZODONE HCL 50 MG PO TABS: 50.0000 mg | ORAL_TABLET | Freq: Every evening | ORAL | Status: DC | PRN

## 2012-05-04 NOTE — Patient Instructions (Addendum)
Take antibiotic for sinus infection  Take cough medicine  Follow-up in 1-2 weeks if you are not feeling better

## 2012-05-04 NOTE — Progress Notes (Signed)
Subjective:     Patient ID: Cleaster Corin, female   DOB: January 10, 1956, 57 y.o.   MRN: [id number]  HPI Ms. Basley is a 57 year old female with complaints of a non-productive cough for the past month. She recently visited the ED on 12/31 for worsening of her cough, a chest x-ray was done which showed no infiltrates. Associated symptoms include a sore throat, sinus pain and bilateral ear pain that started about a week ago.She denies fevers, chills and night sweats. Sick contacts include her husband, who was recently treated for pneumonia and is having resolution of his symptoms on Levaquin. She is not a smoker but her husband smokes outside.   Review of Systems As stated above in HPI     Objective:   Physical Exam  Constitutional: No distress.  HENT:  Head: Normocephalic and atraumatic.  Right Ear: Tympanic membrane normal. No drainage.  Left Ear: Tympanic membrane normal. No drainage.       Some erythema of right ear canal noted   Cardiovascular: Normal rate and regular rhythm.   Pulmonary/Chest: Effort normal and breath sounds normal. No respiratory distress. She has no wheezes. She has no rales.  Skin: She is not diaphoretic.   PGY-3 Addendum GEN: NAD PSYCH: appears tired HEENT: normal conjunctiva, moderate to significant tenderness over left maxillary sinus area, ?erythema R tympanic membrane (L looks okay), dry crusts in nares NECK: no LAD PULM: NI WOB; CTAB without w/r/r SKIN: warm, dry      Assessment:        Plan:

## 2012-05-04 NOTE — Assessment & Plan Note (Signed)
Her cough is persistent. She went to the ED between office visits. She was given cough medicine that helped. Albuterol inhaler 2-3 times daily did not help with cough. She has symptoms consistent with a sinusitis. We will treat with Augmentin especially since her husband recently diagnosed with CAP to see if her symptoms improve since they have been going on for 1 month. Follow-up in 1-2 weeks.

## 2012-05-14 ENCOUNTER — Ambulatory Visit: Payer: Medicare Other | Admitting: Family Medicine

## 2012-06-11 ENCOUNTER — Other Ambulatory Visit: Payer: Self-pay | Admitting: Family Medicine

## 2012-06-20 ENCOUNTER — Ambulatory Visit (INDEPENDENT_AMBULATORY_CARE_PROVIDER_SITE_OTHER): Payer: Medicare Other | Admitting: Family Medicine

## 2012-06-20 ENCOUNTER — Emergency Department (HOSPITAL_COMMUNITY)
Admission: EM | Admit: 2012-06-20 | Discharge: 2012-06-20 | Disposition: A | Discharge status: Home / Self Care | Payer: Medicare Other | Source: Home / Self Care

## 2012-06-20 ENCOUNTER — Encounter: Payer: Self-pay | Admitting: Family Medicine

## 2012-06-20 VITALS — BP 131/96 | HR 96 | Temp 98.6°F | Ht 65.0 in | Wt 152.0 lb

## 2012-06-20 DIAGNOSIS — J069 Acute upper respiratory infection, unspecified: Secondary | ICD-10-CM | POA: Diagnosis not present

## 2012-06-20 MED ORDER — IBUPROFEN 600 MG PO TABS: 600.0000 mg | ORAL_TABLET | Freq: Three times a day (TID) | ORAL | Status: DC | PRN

## 2012-06-20 NOTE — Patient Instructions (Signed)

## 2012-06-20 NOTE — Progress Notes (Signed)
Patient ID: Regina Sawyer, female   DOB: 1956/04/13, 57 y.o.   MRN: [id number] SUBJECTIVE:  [name] is a 57 y.o. female who complains of congestion, sore throat, dry cough and myalgias for 2 days. She denies a history of chest pain, chills, fevers, nausea, vomiting and sputum production and denies a history of asthma. Patient does not smoke cigarettes.   OBJECTIVE: She appears well, vital signs are as noted. Ears normal.  Throat and pharynx normal.  Neck supple. No adenopathy in the neck. Nose is congested. Sinuses non tender. The chest is clear, without wheezes or rales.

## 2012-06-20 NOTE — Assessment & Plan Note (Signed)
ASSESSMENT:  viral upper respiratory illness  PLAN: Symptomatic therapy suggested: push fluids, rest, use acetaminophen, ibuprofen, cough suppressant of choice prn and return office visit prn if symptoms persist or worsen. Lack of antibiotic effectiveness discussed with her. Call or return to clinic prn if these symptoms worsen or fail to improve as anticipated.

## 2012-06-24 ENCOUNTER — Emergency Department (INDEPENDENT_AMBULATORY_CARE_PROVIDER_SITE_OTHER)
Admission: EM | Admit: 2012-06-24 | Discharge: 2012-06-24 | Disposition: A | Discharge status: Home / Self Care | Payer: Medicare Other | Source: Home / Self Care

## 2012-06-24 ENCOUNTER — Encounter (HOSPITAL_COMMUNITY): Payer: Self-pay | Admitting: Emergency Medicine

## 2012-06-24 DIAGNOSIS — J209 Acute bronchitis, unspecified: Secondary | ICD-10-CM

## 2012-06-24 MED ORDER — HYDROCOD POLST-CHLORPHEN POLST 10-8 MG/5ML PO LQCR: 5.0000 mL | Freq: Two times a day (BID) | ORAL | Status: DC

## 2012-06-24 MED ORDER — MOXIFLOXACIN HCL 400 MG PO TABS: 400.0000 mg | ORAL_TABLET | Freq: Every day | ORAL | Status: DC

## 2012-06-24 NOTE — ED Notes (Signed)
Waiting discharge papers 

## 2012-06-24 NOTE — ED Notes (Signed)
Pt c/o productive cough with yellow sputum. Denies sob and chest pain.  Generalized body aches, headache. And feeling feverish.  Pt denies n/v/d. Pt has used otc meds with no relief.

## 2012-06-24 NOTE — ED Provider Notes (Signed)
History     CSN: [id number]  Arrival date & time 06/24/12  1123   First MD Initiated Contact with Patient 06/24/12 1143      Chief Complaint  Patient presents with  . URI    productive cough fever. headache. body aches    (Consider location/radiation/quality/duration/timing/severity/associated sxs/prior treatment) Patient is a 57 y.o. female presenting with URI.  URI Presenting symptoms: congestion, cough, ear pain and fatigue   Presenting symptoms: no fever    This is a 57 year old female with asthma who presents with a complaint of cough productive of yellow mucus for a little over one week now. She has generalized body aches but has not had any fevers. She also complains of drainage from her nose which is yellow , postnasal drip, bilateral earache and sore throat. She's had some mild shortness of breath but has not used her inhaler because she couldn't find it until this morning.  Past Medical History  Diagnosis Date  . Hypertension   . GERD (gastroesophageal reflux disease)   . Heart murmur   . Ulcer   . Depression   . History of bacterial infection   . Sickle cell trait   . Tenderness of female pelvic organs 2005  . H/O vaginitis 2005  . Fibroid 2005  . BV (bacterial vaginosis) 2005  . Candida vaginitis 2006  . Libido, decreased 2006  . Female pelvic pain 2006  . Dysuria 2007  . Irregular periods/menstrual cycles 2007  . Menopausal symptoms 2007  . Hx: UTI (urinary tract infection) 2007  . H/O dyspareunia 2008  . Proteinuria 2009  . Nocturia 2009  . Candida vaginitis 2009  . Atrophic vaginitis 2009  . H/O constipation 2009  . History of hemorrhoids 2009  . Vaginal irritation 2011  . H/O urinary frequency 2011    Past Surgical History  Procedure Laterality Date  . Carpal tunnel release      both wrists  . Tubal ligation      Family History  Problem Relation Age of Onset  . Diabetes Sister   . Hypertension Sister   . Diabetes Other     History   Substance Use Topics  . Smoking status: Passive Smoke Exposure - Never Smoker  . Smokeless tobacco: Never Used  . Alcohol Use: No    OB History   Grav Para Term Preterm Abortions TAB SAB Ect Mult Living   2 2 2  0 0 0 0 0 0 2      Review of Systems  Constitutional: Positive for appetite change and fatigue. Negative for fever, chills, diaphoresis and unexpected weight change.  HENT: Positive for ear pain, congestion, postnasal drip and sinus pressure.   Eyes: Negative.   Respiratory: Positive for cough and shortness of breath.   Cardiovascular: Negative.   Gastrointestinal: Negative.   Endocrine: Negative.   Genitourinary: Negative.   Musculoskeletal: Negative.   Skin: Negative.   Neurological: Negative.     Allergies  Latex  Home Medications   Current Outpatient Rx  Name  Route  Sig  Dispense  Refill  . hydrochlorothiazide (HYDRODIURIL) 25 MG tablet      TAKE 1 TABLET EVERY DAY   30 tablet   11   . mirtazapine (REMERON) 45 MG tablet      TAKE 1 TABLET BY MOUTH AT BEDTIME   30 tablet   0   . omeprazole (PRILOSEC) 20 MG capsule   Oral   Take 1 capsule (20 mg total) by mouth daily.  30 capsule   4   . traZODone (DESYREL) 50 MG tablet   Oral   Take 1 tablet (50 mg total) by mouth at bedtime as needed for sleep.   30 tablet   1   . albuterol (PROVENTIL HFA;VENTOLIN HFA) 108 (90 BASE) MCG/ACT inhaler   Inhalation   Inhale 2 puffs into the lungs every 4 (four) hours as needed for wheezing.   1 Inhaler   0   . chlorpheniramine-HYDROcodone (TUSSIONEX PENNKINETIC ER) 10-8 MG/5ML LQCR   Oral   Take 5 mLs by mouth every 12 (twelve) hours.   140 mL   0   . clotrimazole-betamethasone (LOTRISONE) cream   Topical   Apply topically 2 (two) times daily. for itching   30 g   0   . estradiol (ESTRACE VAGINAL) 0.1 MG/GM vaginal cream   Vaginal   Place 0.25 Applicatorfuls vaginally daily.   42.5 g   12   . Estradiol (VAGIFEM) 10 MCG TABS   Vaginal    Place 1 tablet (10 mcg total) vaginally 2 (two) times a week.   8 tablet   11   . fluticasone (FLONASE) 50 MCG/ACT nasal spray   Nasal   Place 2 sprays into the nose daily.   16 g   6   . ibuprofen (ADVIL,MOTRIN) 600 MG tablet   Oral   Take 1 tablet (600 mg total) by mouth every 8 (eight) hours as needed for pain.   10 tablet   0   . moxifloxacin (AVELOX) 400 MG tablet   Oral   Take 1 tablet (400 mg total) by mouth daily.   5 tablet   0   . Spacer/Aero-Holding Chambers (AEROCHAMBER MV) inhaler      Use as instructed   1 each   2     BP 144/75  Pulse 90  Temp(Src) 98.9 F (37.2 C) (Oral)  Resp 18  SpO2 100%  Physical Exam  ED Course  Procedures (including critical care time)  Labs Reviewed - No data to display No results found.   1. Acute bronchitis       MDM  5 days of Avelox and cough syrup with codeine.        Calvert Cantor, MD 06/24/12 1246

## 2012-07-15 ENCOUNTER — Other Ambulatory Visit: Payer: Self-pay | Admitting: Family Medicine

## 2012-07-25 ENCOUNTER — Telehealth: Payer: Self-pay | Admitting: Family Medicine

## 2012-07-25 ENCOUNTER — Other Ambulatory Visit (HOSPITAL_COMMUNITY)
Admission: RE | Admit: 2012-07-25 | Discharge: 2012-07-25 | Disposition: A | Discharge status: Home / Self Care | Payer: Medicare Other | Source: Ambulatory Visit | Attending: Family Medicine | Admitting: Family Medicine

## 2012-07-25 ENCOUNTER — Ambulatory Visit (INDEPENDENT_AMBULATORY_CARE_PROVIDER_SITE_OTHER): Payer: Medicare Other | Admitting: Family Medicine

## 2012-07-25 ENCOUNTER — Encounter: Payer: Self-pay | Admitting: Family Medicine

## 2012-07-25 VITALS — BP 146/82 | HR 108 | Ht 62.0 in | Wt 153.0 lb

## 2012-07-25 DIAGNOSIS — Z113 Encounter for screening for infections with a predominantly sexual mode of transmission: Secondary | ICD-10-CM | POA: Diagnosis not present

## 2012-07-25 DIAGNOSIS — Z124 Encounter for screening for malignant neoplasm of cervix: Secondary | ICD-10-CM

## 2012-07-25 DIAGNOSIS — N898 Other specified noninflammatory disorders of vagina: Secondary | ICD-10-CM | POA: Diagnosis not present

## 2012-07-25 DIAGNOSIS — Z1151 Encounter for screening for human papillomavirus (HPV): Secondary | ICD-10-CM | POA: Diagnosis not present

## 2012-07-25 DIAGNOSIS — N899 Noninflammatory disorder of vagina, unspecified: Secondary | ICD-10-CM | POA: Diagnosis not present

## 2012-07-25 DIAGNOSIS — Z0000 Encounter for general adult medical examination without abnormal findings: Secondary | ICD-10-CM | POA: Diagnosis not present

## 2012-07-25 LAB — POCT WET PREP (WET MOUNT)

## 2012-07-25 MED ORDER — METRONIDAZOLE 500 MG PO TABS: 500.0000 mg | ORAL_TABLET | Freq: Two times a day (BID) | ORAL | Status: DC

## 2012-07-25 NOTE — Patient Instructions (Addendum)
Wet prep and gonorrhea/chlamydia today   Follow-up in 1-2 weeks to talk about your sleep

## 2012-07-25 NOTE — Assessment & Plan Note (Signed)
Check Pap smear today.

## 2012-07-25 NOTE — Progress Notes (Signed)
  Subjective:    Patient ID: Cleaster Corin, female    DOB: 1955/07/03, 57 y.o.   MRN: [id number]  HPI # SDA. She thinks she has a vaginal infection. She has been itching for 2-3 days. She also reports a fishy smell.  History of BV? Yes, but not for a long time.  Sexually active? Yes with husband only.   LMP? Menopausal  Review of Systems Denies fevers, chills, nausea, recent antibiotic use Denies vaginal bleeding Denies dysuria/frequency/urgency  Allergies, medication, past medical history reviewed.  Smoking status noted.     Objective:   Physical Exam GEN: NAD ABD: soft, NT, ND GU:    Vulva: normal appearing, no lesions   Vagina: non-tender, no obvious discharge, some vaginal atrophy; cervix with some bleeding with Pap     Assessment & Plan:

## 2012-07-25 NOTE — Assessment & Plan Note (Signed)
Wet prep, GC/Chlamydia.

## 2012-07-25 NOTE — Telephone Encounter (Signed)
Notified positive BV, Rx sent

## 2012-08-06 ENCOUNTER — Encounter: Payer: Self-pay | Admitting: Family Medicine

## 2012-08-08 ENCOUNTER — Ambulatory Visit: Payer: Medicare Other | Admitting: Family Medicine

## 2012-08-08 ENCOUNTER — Telehealth: Payer: Self-pay | Admitting: Family Medicine

## 2012-08-08 NOTE — Telephone Encounter (Signed)
Pt would like to have her depression medicine called in to the pharmacy

## 2012-08-08 NOTE — Telephone Encounter (Signed)
Would you call patient please?  We were supposed to meet to discuss her depression medications. I would like her to be seen before calling any medications in.

## 2012-08-09 ENCOUNTER — Other Ambulatory Visit: Payer: Self-pay | Admitting: *Deleted

## 2012-08-09 NOTE — Telephone Encounter (Signed)
Attempted to call pt but number has been disconnected.  Pt missed appt yesterday with MD and will need an appt before meds will be given. Regina Sawyer, [name]

## 2012-08-15 ENCOUNTER — Ambulatory Visit (INDEPENDENT_AMBULATORY_CARE_PROVIDER_SITE_OTHER): Payer: Medicare Other | Admitting: Family Medicine

## 2012-08-15 VITALS — BP 137/80 | HR 93 | Ht 62.0 in | Wt 151.0 lb

## 2012-08-15 DIAGNOSIS — F411 Generalized anxiety disorder: Secondary | ICD-10-CM | POA: Diagnosis not present

## 2012-08-15 MED ORDER — MIRTAZAPINE 45 MG PO TABS: 45.0000 mg | ORAL_TABLET | Freq: Every day | ORAL | Status: DC

## 2012-08-15 MED ORDER — OMEPRAZOLE 20 MG PO CPDR: 20.0000 mg | DELAYED_RELEASE_CAPSULE | Freq: Every day | ORAL | Status: DC

## 2012-08-15 MED ORDER — TRAZODONE HCL 50 MG PO TABS: 50.0000 mg | ORAL_TABLET | Freq: Every evening | ORAL | Status: DC | PRN

## 2012-08-15 MED ORDER — CALCIUM-VITAMIN D 600-400 MG-UNIT PO TABS: 1.0000 | ORAL_TABLET | Freq: Two times a day (BID) | ORAL | Status: DC

## 2012-08-15 NOTE — Assessment & Plan Note (Signed)
She has been on mirtazepine and Trazodone for about a year started when her mother passed away. Since they help with her anxiety and depression and "help her focus" we will continue. Re-evaluate in 1 year.

## 2012-08-15 NOTE — Progress Notes (Signed)
  Subjective:    Patient ID: Regina Sawyer, female    DOB: [birth year], 57 y.o.   MRN: [id number]  HPI # Medication refill for insomnia and depression.  Mirtazepine (Remeron) for depression and Trazodone for sleep. She says these medications help her depression symptoms and to help her sleep. She does not want to discontinue at this time. She has been out of medications for a few days and she has been having trouble concentrating.    Review of Systems Denies SI/HI  Allergies, medication, past medical history reviewed.  Smoking status noted.     Objective:   Physical Exam GEN: NAD PSYCH: pleasant, not anxious or depressed appearing, appropriate to questions; alert and oriented     Assessment & Plan:

## 2012-08-15 NOTE — Patient Instructions (Addendum)
Continue Remeron and Trazodone. Follow-up in 1 year.

## 2012-08-31 ENCOUNTER — Ambulatory Visit: Payer: Medicare Other | Admitting: Family Medicine

## 2012-10-10 ENCOUNTER — Other Ambulatory Visit: Payer: Self-pay | Admitting: Family Medicine

## 2012-10-10 MED ORDER — HYDROCHLOROTHIAZIDE 25 MG PO TABS: 25.0000 mg | ORAL_TABLET | Freq: Every day | ORAL | Status: DC

## 2012-10-16 ENCOUNTER — Ambulatory Visit: Payer: Medicare Other | Admitting: Family Medicine

## 2012-10-16 ENCOUNTER — Encounter: Payer: Self-pay | Admitting: Family Medicine

## 2012-10-16 ENCOUNTER — Ambulatory Visit (INDEPENDENT_AMBULATORY_CARE_PROVIDER_SITE_OTHER): Payer: Medicare Other | Admitting: Family Medicine

## 2012-10-16 VITALS — BP 130/83 | HR 78 | Temp 99.4°F | Ht 62.0 in | Wt 147.9 lb

## 2012-10-16 DIAGNOSIS — R300 Dysuria: Secondary | ICD-10-CM

## 2012-10-16 DIAGNOSIS — N3090 Cystitis, unspecified without hematuria: Secondary | ICD-10-CM | POA: Diagnosis not present

## 2012-10-16 LAB — POCT URINALYSIS DIPSTICK
Blood, UA: NEGATIVE
Glucose, UA: NEGATIVE
Spec Grav, UA: 1.02
Urobilinogen, UA: 0.2

## 2012-10-16 LAB — POCT UA - MICROSCOPIC ONLY

## 2012-10-16 MED ORDER — CEPHALEXIN 500 MG PO CAPS: 500.0000 mg | ORAL_CAPSULE | Freq: Four times a day (QID) | ORAL | Status: DC

## 2012-10-16 NOTE — Assessment & Plan Note (Signed)
Uncomplicated cystitis, leuk est positive on office UA.  No signs/sx of systemic illness. Empiric treatment without culture today.  She is instructed to drink plenty of water, to call if she develops fevers/chills, or abdominal pain. She is not having problems with constipation or diarrhoea at this time.  May have some loose stool with the Cephalexin 500mg  QID that I prescribed today for a 7 day course.

## 2012-10-16 NOTE — Patient Instructions (Addendum)
It was a pleasure to see you today.  I believe you have a simple bladder infection.   I sent a prescription for an antibiotic, Keflex (cephalexin) 500mg  tablets, take 1 tablet 4 times daily for 7 days.  Please drink plenty of water.   Call us if you are not noticing improvement in symptoms, if you start with fevers/chills or abd/back pain, or with other concerns.  Schedule follow up with your new primary doctor, Dr. Pollie Meyer, in the coming month.

## 2012-10-16 NOTE — Progress Notes (Signed)
  Subjective:    Patient ID: Regina Sawyer, female    DOB: 09/27/1955, 57 y.o.   MRN: [id number]  HPI Patient here for SDA for urinary frequency, feeling of incomplete emptying.  No fevers or chills, no vaginal discharge. She has had uncomplicated cystitis in the past, also BV.  Says she does not have the malodorous discharge that is typical of BV. Feels like prior episodes of uncomplicated cystitis.  No dysuria.   Last BV on July 25, 2012, cleared up with treatment.   States she doesn;t drink much water. Review of Systems     Objective:   Physical Exam Alert, well appearing, no apparent distress HEENT Neck supple. No cervical adenopathy.  COR Regular S1S2 PULM Clear bilaterally  ABD soft, nontender with exception of some mild suprapubic tenderness. NO CVA TENDERNESS>        Assessment & Plan:

## 2012-10-29 ENCOUNTER — Ambulatory Visit: Payer: Medicare Other

## 2012-10-31 ENCOUNTER — Ambulatory Visit (INDEPENDENT_AMBULATORY_CARE_PROVIDER_SITE_OTHER): Payer: Medicare Other | Admitting: Family Medicine

## 2012-10-31 ENCOUNTER — Other Ambulatory Visit (HOSPITAL_COMMUNITY)
Admission: RE | Admit: 2012-10-31 | Discharge: 2012-10-31 | Disposition: A | Discharge status: Home / Self Care | Payer: Medicare Other | Source: Ambulatory Visit | Attending: Family Medicine | Admitting: Family Medicine

## 2012-10-31 VITALS — BP 107/73 | HR 86 | Temp 98.9°F | Wt 145.0 lb

## 2012-10-31 DIAGNOSIS — L293 Anogenital pruritus, unspecified: Secondary | ICD-10-CM

## 2012-10-31 DIAGNOSIS — Z20828 Contact with and (suspected) exposure to other viral communicable diseases: Secondary | ICD-10-CM | POA: Diagnosis not present

## 2012-10-31 DIAGNOSIS — Z113 Encounter for screening for infections with a predominantly sexual mode of transmission: Secondary | ICD-10-CM | POA: Diagnosis not present

## 2012-10-31 DIAGNOSIS — N898 Other specified noninflammatory disorders of vagina: Secondary | ICD-10-CM

## 2012-10-31 DIAGNOSIS — Z202 Contact with and (suspected) exposure to infections with a predominantly sexual mode of transmission: Secondary | ICD-10-CM | POA: Diagnosis not present

## 2012-10-31 LAB — POCT WET PREP (WET MOUNT)

## 2012-10-31 LAB — POCT URINALYSIS DIPSTICK
Bilirubin, UA: NEGATIVE
Glucose, UA: NEGATIVE
Ketones, UA: NEGATIVE

## 2012-10-31 LAB — POCT UA - MICROSCOPIC ONLY

## 2012-10-31 MED ORDER — TRIAMCINOLONE ACETONIDE 0.025 % EX OINT: TOPICAL_OINTMENT | Freq: Two times a day (BID) | CUTANEOUS | Status: DC | PRN

## 2012-10-31 NOTE — Patient Instructions (Signed)
It was great to see you again today!  I sent in a prescription for an ointment. Apply it to the outside of your vulva as needed twice a day. Do not use for more than one week. Do not apply inside your vagina.  I will call you if your test results are not normal.  Otherwise, I will send you a letter.  If you do not hear from me with in 2 weeks please call our office.     Come back in 1 month to follow up on your chronic medical problems.  Be well, Dr. Pollie Meyer

## 2012-10-31 NOTE — Progress Notes (Signed)
Patient ID: Regina Sawyer, female   DOB: [birth year], 57 y.o.   MRN: [id number]   HPI:  Vaginal itching: Pt reports vaginal itching and burning, which started 3-4 days ago. Denies any discharge, fevers, vaginal bleeding. Has hx of latex allergy, and has been using latex condoms with her husband, along with astroglide. She has been post menopausal and has not had a period in 4-6 years. She is using condoms because she does not trust her husband and thinks he might be having intercourse with people outside of their marriage. Denies having any sexual partners other than her husband. She would like to be tested for STD's. Reports mild LLQ pain. Has to use lubrication with sex because of vaginal dryness.Has hx of vaginal yeast infection. Was recently treated with PO keflex for UTI. Dysuria has resolved since then.   ROS: See HPI  PMFSH: hx HLD, HTN  PHYSICAL EXAM: BP 107/73  Pulse 86  Temp(Src) 98.9 F (37.2 C) (Oral)  Wt 145 lb (65.772 kg)  BMI 26.51 kg/m2 Gen: NAD Lungs: NWOB Abd: mild tenderness to palpation of LLQ. GU: normal appearing external genitalia. Vagina is moist. No discharge or odor present. No tenderness on bimanual exam, or cervical motion tenderness. No adnexal masses palpable.   ASSESSMENT/PLAN:  # chronic medical problems -advised pt schedule f/u appt to discuss chronic medical problems.

## 2012-11-01 LAB — HIV ANTIBODY (ROUTINE TESTING W REFLEX): HIV: NONREACTIVE

## 2012-11-01 LAB — RPR

## 2012-11-02 ENCOUNTER — Encounter: Payer: Self-pay | Admitting: Family Medicine

## 2012-11-03 DIAGNOSIS — N898 Other specified noninflammatory disorders of vagina: Secondary | ICD-10-CM | POA: Insufficient documentation

## 2012-11-03 NOTE — Assessment & Plan Note (Addendum)
Most likely related to using latex condoms with husband. No abnormalities noted on exam today, except for very mild tenderness on LLQ. Advised pt schedule an appointment to discuss abd pain in greater detail since the focus of this visit was on her vaginal complaints. Will send gc/chlamydia, wet prep, also check serum HIV Ab and RPR. Husband accompanied pt to this visit and requested STD testing today as well, but advised him that he would need to schedule a separate visit to be tested. Advised patient to avoid latex containing-condoms and lubrications. Will rx triamcinolone 0.025% for topical use BID prn on skin outside of vagina. Advised pt not to place steroid ointment in vagina, and not to use for more than 1 week. F/u if not improved.

## 2012-11-12 ENCOUNTER — Ambulatory Visit: Payer: Medicare Other | Admitting: Family Medicine

## 2012-11-15 ENCOUNTER — Other Ambulatory Visit: Payer: Self-pay | Admitting: Obstetrics and Gynecology

## 2012-11-15 DIAGNOSIS — R109 Unspecified abdominal pain: Secondary | ICD-10-CM | POA: Diagnosis not present

## 2012-11-15 DIAGNOSIS — R82998 Other abnormal findings in urine: Secondary | ICD-10-CM | POA: Diagnosis not present

## 2012-11-20 DIAGNOSIS — H43399 Other vitreous opacities, unspecified eye: Secondary | ICD-10-CM | POA: Diagnosis not present

## 2012-11-20 DIAGNOSIS — H26499 Other secondary cataract, unspecified eye: Secondary | ICD-10-CM | POA: Diagnosis not present

## 2012-11-20 DIAGNOSIS — H5502 Latent nystagmus: Secondary | ICD-10-CM | POA: Diagnosis not present

## 2012-11-20 DIAGNOSIS — H53029 Refractive amblyopia, unspecified eye: Secondary | ICD-10-CM | POA: Diagnosis not present

## 2012-11-20 DIAGNOSIS — H5010 Unspecified exotropia: Secondary | ICD-10-CM | POA: Diagnosis not present

## 2012-11-26 ENCOUNTER — Emergency Department (INDEPENDENT_AMBULATORY_CARE_PROVIDER_SITE_OTHER)
Admission: EM | Admit: 2012-11-26 | Discharge: 2012-11-26 | Disposition: A | Discharge status: Home / Self Care | Payer: Medicare Other | Source: Home / Self Care

## 2012-11-26 ENCOUNTER — Other Ambulatory Visit (HOSPITAL_COMMUNITY): Payer: Medicare Other

## 2012-11-26 ENCOUNTER — Encounter (HOSPITAL_COMMUNITY): Payer: Self-pay | Admitting: Emergency Medicine

## 2012-11-26 ENCOUNTER — Emergency Department (INDEPENDENT_AMBULATORY_CARE_PROVIDER_SITE_OTHER): Payer: Medicare Other

## 2012-11-26 DIAGNOSIS — S8990XA Unspecified injury of unspecified lower leg, initial encounter: Secondary | ICD-10-CM | POA: Diagnosis not present

## 2012-11-26 DIAGNOSIS — M25569 Pain in unspecified knee: Secondary | ICD-10-CM | POA: Diagnosis not present

## 2012-11-26 DIAGNOSIS — S8000XA Contusion of unspecified knee, initial encounter: Secondary | ICD-10-CM

## 2012-11-26 DIAGNOSIS — S8002XA Contusion of left knee, initial encounter: Secondary | ICD-10-CM

## 2012-11-26 MED ORDER — TRAMADOL HCL 50 MG PO TABS: 50.0000 mg | ORAL_TABLET | Freq: Four times a day (QID) | ORAL | Status: DC | PRN

## 2012-11-26 NOTE — ED Provider Notes (Signed)
Regina Sawyer is a 57 y.o. female who presents to Urgent Care today for left lateral knee pain for the last 3 days. Patient hit the lateral aspect of her knee on the corner of a coffee table about 3 days ago. She notes continued pain and swelling insulin. Additionally she notes some burning sensation but denies any radiating pain weakness or numbness. She has tried Tylenol which has helped a bit. The pain is moderate in intensity. She feels well otherwise and denies any locking clicking or catching.    PMH reviewed. Hypertension History  Substance Use Topics  . Smoking status: Passive Smoke Exposure - Never Smoker  . Smokeless tobacco: Never Used  . Alcohol Use: No   ROS as above: No fevers chills nausea vomiting or diarrhea Medications reviewed. No current facility-administered medications for this encounter.   Current Outpatient Prescriptions  Medication Sig Dispense Refill  . hydrochlorothiazide (HYDRODIURIL) 25 MG tablet Take 1 tablet (25 mg total) by mouth daily.  30 tablet  6  . traMADol (ULTRAM) 50 MG tablet Take 1 tablet (50 mg total) by mouth every 6 (six) hours as needed for pain.  30 tablet  0    Exam:  BP 127/67  Pulse 74  Temp(Src) 97.7 F (36.5 C) (Oral)  Resp 16  SpO2 100% Gen: Well NAD Left Knee: Normal appearing. No abrasion or ecchymosis. Tender to palpation lateral distal femur.  Nontender joint line.  Range of motion 0-110.  Positive McMurray's test Negative Lachman's valgus varus stress.  Capillary refill pulses and sensation are intact distally bilaterally  No results found for this or any previous visit (from the past 24 hour(s)). Dg Knee Complete 4 Views Left  11/26/2012   *RADIOLOGY REPORT*  Clinical Data: Lateral knee pain following injury  LEFT KNEE - COMPLETE 4+ VIEW  Comparison: None.  Findings: No acute fracture or dislocation is identified.  No gross soft tissue abnormality is seen.  IMPRESSION: No acute abnormality noted.   Original Report  Authenticated By: Alcide Clever, M.D.    Assessment and Plan: 57 y.o. female with left knee contusion. No sign of structural damage.  Plan for tramadol and ice as needed for pain. Followup with primary care provider if not improving. Discussed warning signs or symptoms. Please see discharge instructions. Patient expresses understanding.      Rodolph Bong, MD 11/26/12 [phone]

## 2012-11-26 NOTE — ED Notes (Signed)
C/o left knee pain for three days.  Patient thinks she might hit leg on something.  Patient admits to taking tylenol.  Patient states she does have burning in the knee.

## 2012-11-29 DIAGNOSIS — Z1231 Encounter for screening mammogram for malignant neoplasm of breast: Secondary | ICD-10-CM | POA: Diagnosis not present

## 2013-01-08 DIAGNOSIS — H5502 Latent nystagmus: Secondary | ICD-10-CM | POA: Diagnosis not present

## 2013-01-08 DIAGNOSIS — H5010 Unspecified exotropia: Secondary | ICD-10-CM | POA: Diagnosis not present

## 2013-01-08 DIAGNOSIS — H43399 Other vitreous opacities, unspecified eye: Secondary | ICD-10-CM | POA: Diagnosis not present

## 2013-01-11 ENCOUNTER — Emergency Department (INDEPENDENT_AMBULATORY_CARE_PROVIDER_SITE_OTHER)
Admission: EM | Admit: 2013-01-11 | Discharge: 2013-01-11 | Disposition: A | Discharge status: Home / Self Care | Payer: Medicare Other | Source: Home / Self Care | Attending: Emergency Medicine | Admitting: Emergency Medicine

## 2013-01-11 ENCOUNTER — Emergency Department (INDEPENDENT_AMBULATORY_CARE_PROVIDER_SITE_OTHER): Payer: Medicare Other

## 2013-01-11 ENCOUNTER — Encounter (HOSPITAL_COMMUNITY): Payer: Self-pay | Admitting: *Deleted

## 2013-01-11 DIAGNOSIS — M67919 Unspecified disorder of synovium and tendon, unspecified shoulder: Secondary | ICD-10-CM

## 2013-01-11 DIAGNOSIS — M25519 Pain in unspecified shoulder: Secondary | ICD-10-CM | POA: Diagnosis not present

## 2013-01-11 DIAGNOSIS — M7582 Other shoulder lesions, left shoulder: Secondary | ICD-10-CM

## 2013-01-11 DIAGNOSIS — S4980XA Other specified injuries of shoulder and upper arm, unspecified arm, initial encounter: Secondary | ICD-10-CM | POA: Diagnosis not present

## 2013-01-11 MED ORDER — HYDROCODONE-ACETAMINOPHEN 5-325 MG PO TABS: ORAL_TABLET | ORAL | Status: DC

## 2013-01-11 MED ORDER — HYDROCODONE-ACETAMINOPHEN 5-325 MG PO TABS
ORAL_TABLET | ORAL | Status: AC
  Filled 2013-01-11: qty 2

## 2013-01-11 MED ORDER — HYDROCODONE-ACETAMINOPHEN 5-325 MG PO TABS
2.0000 | ORAL_TABLET | Freq: Once | ORAL | Status: AC
  Administered 2013-01-11: 2 via ORAL

## 2013-01-11 NOTE — ED Notes (Signed)
C/o pain in left shoulder. States she lifted a heavy box at work 2 weeks ago and her shoulder has hurt ever since.  Admitted to trying tylenol for the pain.

## 2013-01-11 NOTE — ED Provider Notes (Signed)
Chief Complaint:   Chief Complaint  Patient presents with  . Shoulder Pain    History of Present Illness:   Regina Sawyer is a 57 year old female who has had a two-week history of left shoulder pain. 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 It hurts to lift, to abduct, flex the arm. The pain is localized laterally with radiation down to the antecubital fossa but not below the elbow. She denies any numbness, tingling, muscle weakness, or swelling of the arm. The pain feels like a burning sensation. She denies any neck, upper back, or chest pain.  Review of Systems:  Other than noted above, the patient denies any of the following symptoms: Systemic:  No fevers, chills, sweats, or aches.  No fatigue or tiredness. Musculoskeletal:  No joint pain, arthritis, bursitis, swelling, back pain, or neck pain. Neurological:  No muscular weakness, paresthesias, headache, or trouble with speech or coordination.  No dizziness.  PMFSH:  Past medical history, family history, social history, meds, and allergies were reviewed.  She takes hydrochlorothiazide for her blood pressure. She has a history of high blood pressure, ulcer, and heart murmur.  Physical Exam:   Vital signs:  BP 131/80  Pulse 87  Temp(Src) 98.6 F (37 C) (Oral)  Resp 16  SpO2 100% Gen:  Alert and oriented times 3.  In no distress. Musculoskeletal: There was tenderness to palpation laterally, anteriorly, and posteriorly. The shoulder has a limited range of motion. She is only able to flex and abduct to about 60. Going beyond that causes her too much pain. There was no obvious swelling. Because of pain unable to perform Neer test, Hawkins tests, or empty cans test. Otherwise, all joints had a full a ROM with no swelling, bruising or deformity.  No edema, pulses full. Extremities were warm and pink.  Capillary refill was brisk.  Skin:  Clear, warm and dry.  No  rash. Neuro:  Alert and oriented times 3.  Muscle strength was normal.  Sensation was intact to light touch.   Radiology:  Dg Shoulder Left  01/11/2013   CLINICAL DATA:  Left shoulder pain for 2 weeks after a lifting injury.  EXAM: LEFT SHOULDER - 2+ VIEW  COMPARISON:  None.  FINDINGS: The left shoulder is located. No acute or healing fracture is evident. The medial clavicle is incompletely imaged. The visualized bony thorax is clear.  IMPRESSION: 1. No acute or healing abnormality. 2. The medial clavicle is incompletely imaged. If the patient is tender over the clavicle, clavicle imaging may be useful for further evaluation.   Electronically Signed   By: Gennette Pac   On: 01/11/2013 16:30   I reviewed the images independently and personally and concur with the radiologist's findings.  Course in Urgent Care Center:   She was placed in a sling and told to use this in the next 3 days along with ice. Was given Norco 5/325, 2 tablets for pain.  Assessment:  The encounter diagnosis was Rotator cuff tendonitis, left.  Offered her a corticosteroid injection, but she declined. Suggested rest with a sling for 3 days, then she's to discontinue the sling and start doing exercises. Given a note to remain out of work through Tuesday. She was given hydrocodone for pain. If no better will need followup with orthopedics.  Plan:   1.  Meds:  The following meds were prescribed:   Discharge Medication List as  of 01/11/2013  4:59 PM    START taking these medications   Details  HYDROcodone-acetaminophen (NORCO/VICODIN) 5-325 MG per tablet 1 to 2 tabs every 4 to 6 hours as needed for pain., Print        2.  Patient Education/Counseling:  The patient was given appropriate handouts, self care instructions, and instructed in symptomatic relief.  Suggested rest and ice, followed by exercising.  3.  Follow up:  The patient was told to follow up if no better in 3 to 4 days, if becoming worse in any way, and given  some red flag symptoms such as worsening pain which would prompt immediate return.  Follow up with Dr. Annell Greening.      Reuben Likes, MD 01/11/13 [phone]

## 2013-02-20 DIAGNOSIS — R350 Frequency of micturition: Secondary | ICD-10-CM | POA: Diagnosis not present

## 2013-02-20 DIAGNOSIS — N760 Acute vaginitis: Secondary | ICD-10-CM | POA: Diagnosis not present

## 2013-02-26 ENCOUNTER — Telehealth: Payer: Self-pay | Admitting: Family Medicine

## 2013-02-28 DIAGNOSIS — Z1211 Encounter for screening for malignant neoplasm of colon: Secondary | ICD-10-CM | POA: Diagnosis not present

## 2013-04-04 ENCOUNTER — Telehealth: Payer: Self-pay | Admitting: Family Medicine

## 2013-04-04 MED ORDER — HYDROCHLOROTHIAZIDE 25 MG PO TABS: 25.0000 mg | ORAL_TABLET | Freq: Every day | ORAL | Status: DC

## 2013-04-04 NOTE — Telephone Encounter (Signed)
To [facility] red team - please call pt and let her know I have refilled one month's worth of her HCTZ but she needs to schedule an appointment to follow up on her chronic medical problems. Thanks!   Latrelle Dodrill, MD

## 2013-04-05 NOTE — Telephone Encounter (Signed)
LMVM for patient to schedule an appt per MD.  Radene Ou, CMA

## 2013-04-29 DIAGNOSIS — IMO0002 Reserved for concepts with insufficient information to code with codable children: Secondary | ICD-10-CM | POA: Diagnosis not present

## 2013-04-29 DIAGNOSIS — R350 Frequency of micturition: Secondary | ICD-10-CM | POA: Diagnosis not present

## 2013-05-10 DIAGNOSIS — IMO0002 Reserved for concepts with insufficient information to code with codable children: Secondary | ICD-10-CM | POA: Diagnosis not present

## 2013-06-07 ENCOUNTER — Ambulatory Visit (INDEPENDENT_AMBULATORY_CARE_PROVIDER_SITE_OTHER): Payer: Medicare Other | Admitting: Emergency Medicine

## 2013-06-07 ENCOUNTER — Encounter: Payer: Self-pay | Admitting: Emergency Medicine

## 2013-06-07 VITALS — BP 129/77 | HR 91 | Temp 99.0°F | Ht 62.0 in | Wt 140.0 lb

## 2013-06-07 DIAGNOSIS — L293 Anogenital pruritus, unspecified: Secondary | ICD-10-CM

## 2013-06-07 DIAGNOSIS — N898 Other specified noninflammatory disorders of vagina: Secondary | ICD-10-CM

## 2013-06-07 LAB — POCT WET PREP (WET MOUNT): Clue Cells Wet Prep Whiff POC: NEGATIVE

## 2013-06-07 MED ORDER — TRIAMCINOLONE ACETONIDE 0.1 % EX CREA: 1.0000 "application " | TOPICAL_CREAM | Freq: Two times a day (BID) | CUTANEOUS | Status: DC

## 2013-06-07 NOTE — Progress Notes (Signed)
   Subjective:    Patient ID: Regina Sawyer, female    DOB: 09-30-1955, 58 y.o.   MRN: [id number]  HPI Janilah Hojnacki is here for a SDA vaginal itching.  She reports vulvar itching and burning for the last 1-2 weeks.  Also with thin yellowish discharge.  Started after she started shaving and using latex condoms (reports allergy to latex).  Vaginal dryness.  Using a prescription cream for the OB/GYN 2-3 times a week.  No abdominal pain, fevers, nausea or vomiting.  No urinary symptoms.  States similar to previous episodes where she was treated for BV or yeast.  Current Outpatient Prescriptions on File Prior to Visit  Medication Sig Dispense Refill  . hydrochlorothiazide (HYDRODIURIL) 25 MG tablet Take 1 tablet (25 mg total) by mouth daily.  30 tablet  0  . HYDROcodone-acetaminophen (NORCO/VICODIN) 5-325 MG per tablet 1 to 2 tabs every 4 to 6 hours as needed for pain.  20 tablet  0  . traMADol (ULTRAM) 50 MG tablet Take 1 tablet (50 mg total) by mouth every 6 (six) hours as needed for pain.  30 tablet  0   No current facility-administered medications on file prior to visit.    I have reviewed and updated the following as appropriate: allergies and current medications SHx: non smoker   Review of Systems See HPI    Objective:   Physical Exam BP 129/77  Pulse 91  Temp(Src) 99 F (37.2 C) (Oral)  Ht 5\' 2"  (1.575 m)  Wt 140 lb (63.504 kg)  BMI 25.60 kg/m2 Gen: alert, cooperative, NAD HEENT: AT/[place], sclera white, MMM Neck: supple Abd: +BS, soft, NTND Pelvic: normal external genitalia; normal vagina; small amount of white discharge; normal cervix; no CMT; bimanual exam normal      Assessment & Plan:

## 2013-06-07 NOTE — Patient Instructions (Signed)
It was nice to see you!  Your tests were normal today.  Please avoid shaving - this can cause skin irritation, especially if you have dryness. Avoid latex condoms.  Please change the brand of soap you use as it may be contributing to your itching.  Use Triamcinolone cream twice a day for 1 week.  Do not put in the vagina.  If it is not improving in the next week, please come back.

## 2013-06-07 NOTE — Assessment & Plan Note (Addendum)
Suspect secondary to combination of latex condom use, shaving causing irritation, soap, and vaginal dryness. Wet prep is negative. Discussed no shaving and avoiding latex and her current soap. Will also prescribe triamciniolone to use for 1 week. F/u in 1 week if not improving.

## 2013-06-17 ENCOUNTER — Telehealth: Payer: Self-pay | Admitting: *Deleted

## 2013-06-17 DIAGNOSIS — L293 Anogenital pruritus, unspecified: Secondary | ICD-10-CM | POA: Diagnosis not present

## 2013-06-17 DIAGNOSIS — R300 Dysuria: Secondary | ICD-10-CM | POA: Diagnosis not present

## 2013-06-17 DIAGNOSIS — N898 Other specified noninflammatory disorders of vagina: Secondary | ICD-10-CM | POA: Diagnosis not present

## 2013-06-17 NOTE — Telephone Encounter (Signed)
Authorized six visits to Regina Sawyer for Regina Sawyer, Regina Sawyer

## 2013-07-11 DIAGNOSIS — N760 Acute vaginitis: Secondary | ICD-10-CM | POA: Diagnosis not present

## 2013-08-13 DIAGNOSIS — Z124 Encounter for screening for malignant neoplasm of cervix: Secondary | ICD-10-CM | POA: Diagnosis not present

## 2013-08-13 DIAGNOSIS — N952 Postmenopausal atrophic vaginitis: Secondary | ICD-10-CM | POA: Diagnosis not present

## 2013-08-13 DIAGNOSIS — Z113 Encounter for screening for infections with a predominantly sexual mode of transmission: Secondary | ICD-10-CM | POA: Diagnosis not present

## 2013-08-31 ENCOUNTER — Other Ambulatory Visit: Payer: Self-pay | Admitting: Family Medicine

## 2013-09-02 ENCOUNTER — Encounter: Payer: Self-pay | Admitting: Family Medicine

## 2013-09-02 NOTE — Progress Notes (Signed)
Pt came in stating that she need a  refill on Hydrochlorothiazide sent to the pharmacy.

## 2013-09-02 NOTE — Telephone Encounter (Signed)
Patient has not had BP med since Thursday, states it urgent to get this filled. Please refill.

## 2013-09-03 ENCOUNTER — Other Ambulatory Visit: Payer: Self-pay | Admitting: *Deleted

## 2013-09-03 ENCOUNTER — Telehealth: Payer: Self-pay | Admitting: *Deleted

## 2013-09-03 MED ORDER — HYDROCHLOROTHIAZIDE 25 MG PO TABS: 25.0000 mg | ORAL_TABLET | Freq: Every day | ORAL | Status: DC

## 2013-09-03 NOTE — Progress Notes (Signed)
One month supply of HCTZ sent to patient's pharmacy. I told patient we have already done this one time and she will not get any further refills without an office visit with her PCP.Jaymes Graff [place]

## 2013-09-03 NOTE — Telephone Encounter (Signed)
Received a call from patient saying that the pharmacy never received the Rx for HCTZ that I sent in electronically this morning. I called [facility] [address] and they never received the Rx so I called in a month supply with 0 refills. The patient is aware that she is not to receive any further refills without an office visit with her PCP.Jaymes Graff Kriya Westra

## 2013-09-11 ENCOUNTER — Ambulatory Visit: Payer: Medicare Other | Admitting: Emergency Medicine

## 2013-09-12 DIAGNOSIS — N390 Urinary tract infection, site not specified: Secondary | ICD-10-CM | POA: Diagnosis not present

## 2013-09-12 DIAGNOSIS — R300 Dysuria: Secondary | ICD-10-CM | POA: Diagnosis not present

## 2013-09-12 DIAGNOSIS — L299 Pruritus, unspecified: Secondary | ICD-10-CM | POA: Diagnosis not present

## 2013-09-23 ENCOUNTER — Ambulatory Visit: Payer: Medicare Other | Admitting: Family Medicine

## 2013-10-01 ENCOUNTER — Other Ambulatory Visit: Payer: Self-pay | Admitting: Family Medicine

## 2013-10-07 DIAGNOSIS — L98499 Non-pressure chronic ulcer of skin of other sites with unspecified severity: Secondary | ICD-10-CM | POA: Diagnosis not present

## 2013-10-07 DIAGNOSIS — R300 Dysuria: Secondary | ICD-10-CM | POA: Diagnosis not present

## 2013-10-14 ENCOUNTER — Ambulatory Visit (INDEPENDENT_AMBULATORY_CARE_PROVIDER_SITE_OTHER): Payer: Medicare Other | Admitting: Family Medicine

## 2013-10-14 ENCOUNTER — Encounter: Payer: Self-pay | Admitting: Family Medicine

## 2013-10-14 VITALS — BP 123/77 | HR 78 | Temp 98.1°F | Wt 136.3 lb

## 2013-10-14 DIAGNOSIS — R351 Nocturia: Secondary | ICD-10-CM | POA: Diagnosis not present

## 2013-10-14 DIAGNOSIS — N841 Polyp of cervix uteri: Secondary | ICD-10-CM | POA: Insufficient documentation

## 2013-10-14 DIAGNOSIS — I10 Essential (primary) hypertension: Secondary | ICD-10-CM

## 2013-10-14 LAB — POCT WET PREP (WET MOUNT): Clue Cells Wet Prep Whiff POC: NEGATIVE

## 2013-10-14 LAB — POCT URINALYSIS DIPSTICK
Bilirubin, UA: NEGATIVE
GLUCOSE UA: NEGATIVE
Ketones, UA: NEGATIVE
Leukocytes, UA: NEGATIVE
Nitrite, UA: NEGATIVE
Protein, UA: NEGATIVE
RBC UA: NEGATIVE
Spec Grav, UA: 1.015
UROBILINOGEN UA: 0.2
pH, UA: 5.5

## 2013-10-14 MED ORDER — HYDROCHLOROTHIAZIDE 25 MG PO TABS: ORAL_TABLET | ORAL | Status: DC

## 2013-10-14 NOTE — Patient Instructions (Addendum)
It was great to see you again today!  I think the symptoms you've been having are related to gas. You can try over the counter gas medication to see if this helps. Return if you have fevers, worsening urinary symptoms, or new complaints.  I will call you if your test results are not normal.  Otherwise, I will send you a letter.  If you do not hear from me with in 2 weeks please call our office.      Follow up in 3 months for your blood pressure.  Be well, Dr. Ardelia Mems

## 2013-10-15 LAB — COMPREHENSIVE METABOLIC PANEL
ALK PHOS: 70 U/L (ref 39–117)
ALT: <8 U/L (ref 0–35)
AST: 14 U/L (ref 0–37)
Albumin: 3.8 g/dL (ref 3.5–5.2)
BUN: 11 mg/dL (ref 6–23)
CO2: 29 mEq/L (ref 19–32)
Calcium: 9.1 mg/dL (ref 8.4–10.5)
Chloride: 101 mEq/L (ref 96–112)
Creat: 0.95 mg/dL (ref 0.50–1.10)
Glucose, Bld: 94 mg/dL (ref 70–99)
Potassium: 3.1 mEq/L — ABNORMAL LOW (ref 3.5–5.3)
SODIUM: 139 meq/L (ref 135–145)
Total Bilirubin: 0.3 mg/dL (ref 0.2–1.2)
Total Protein: 6.8 g/dL (ref 6.0–8.3)

## 2013-10-15 NOTE — Progress Notes (Signed)
Patient ID: Regina Sawyer, female   DOB: [birth year], 58 y.o.   MRN: [id number]  HPI:  HTN: currently taking HCTZ 25mg  daily. Does not check her blood pressure at home. Denies any chest pain, SOB, syncope, or lower extremity edema.   Urinary pressure: feels a heavy sensation in her stomach when urinating. Denies fevers, back pain. Reports a hx of UTI and yeast infection in the past. Has been urinating frequently. The sensation improves when she passes gas.  ROS: See HPI. No vaginal bleeding.  [place]: HTN, HLD, anemia, GERD, systolic murmur  PHYSICAL EXAM: BP 123/77  Pulse 78  Temp(Src) 98.1 F (36.7 C) (Oral)  Wt 136 lb 4.8 oz (61.825 kg) Gen: NAD HEENT: NCAT, MMM Heart: RRR, 2/6 systolic murmur present Lungs: CTAB, NWOB Abdomen: soft, nontender to palpation GU: normal appearing external genitalia without lesions. Vagina is moist with thin discharge. Cervix has a small polyp at the os which is not easily removed. No irregularity or color change to the polyp. No cervical motion tenderness or tenderness on bimanual exam. No adnexal masses.  Neuro: grossly nonfocal, speech normal Ext: No appreciable lower extremity edema bilaterally   ASSESSMENT/PLAN:  # Urinary Pressure: suspect this is related to gas since it improves with passing gas. Urinalysis not suggestive of UTI. Wet prep also done today, will await results. Exam benign. Pt may try OTC gas medication to see if it helps.  See problem based charting for additional assessment/plan.  FOLLOW UP: F/u in 3 months for HTN.  [place]. Ardelia Mems, [place]

## 2013-10-20 NOTE — Assessment & Plan Note (Signed)
Noted on exam today. Benign in appearance. Pt asymptomatic at this time. Had normal pap with negative HPV in 2014, so very unlikely to represent malignancy. Will continue to monitor. Precepted with Dr. Adrian Blackwater who also examined patient and agrees with this plan.

## 2013-10-20 NOTE — Assessment & Plan Note (Signed)
Well controlled, continue current regimen. Will check CMET today.

## 2013-10-22 DIAGNOSIS — N898 Other specified noninflammatory disorders of vagina: Secondary | ICD-10-CM | POA: Diagnosis not present

## 2013-10-23 ENCOUNTER — Telehealth: Payer: Self-pay | Admitting: Family Medicine

## 2013-10-23 DIAGNOSIS — E876 Hypokalemia: Secondary | ICD-10-CM

## 2013-10-23 MED ORDER — POTASSIUM CHLORIDE CRYS ER 20 MEQ PO TBCR: 40.0000 meq | EXTENDED_RELEASE_TABLET | Freq: Every day | ORAL | Status: DC

## 2013-10-23 NOTE — Telephone Encounter (Signed)
[facility] red team,  Please call pt and let her know that her potassium was low, and she should take potassium supplementation. I sent in a prescription for her. She needs to f/u in 3 weeks to recheck her potassium level. She can just schedule a lab visit for this.  Thanks, Leeanne Rio, MD

## 2013-10-24 NOTE — Telephone Encounter (Signed)
Patient called and stated she has picked up her potassium Rx.  Scheduled lab appt to recheck potassium for 11/14/13 at 8:30 am.  Burna Forts, BSN, RN-BC

## 2013-10-29 ENCOUNTER — Telehealth: Payer: Self-pay | Admitting: Family Medicine

## 2013-10-29 NOTE — Telephone Encounter (Signed)
Returned pt's call regarding medication.  Pt stated since she started taking the potassium pill she has been having diarrhea every other day.  Bowel movement is water and green in color.  Pt advised to continue medication until nurse hear from PCP, continue fluid intake for hydration, avoid spicy foods, eat mild foods.  Pt denies any severe stomach pain or n/v at this time.  Will forward to PCP for further advise.  Derl Barrow, RN

## 2013-10-29 NOTE — Telephone Encounter (Signed)
Pt called and wanted to talk to a nurse concerning some of the symptoms she is having. She is needing to see if they are related to her medication. kw

## 2013-10-29 NOTE — Telephone Encounter (Signed)
Please advise pt to take the two potassium pills at different times during the day. She should take one pill in the morning (64mEq) with breakfast and then one pill in the evening (68mEq) with dinner. This may help the diarrhea. If she develops abdominal pain, vomiting, or any other concerns she should schedule an appointment to be seen. She should also come in if taking the medicine at separate times does not help.  Thanks, Leeanne Rio, MD

## 2013-10-30 NOTE — Telephone Encounter (Signed)
Left voice message for pt regarding Dr. Lennie Odor message below.  Derl Barrow, RN

## 2013-11-04 DIAGNOSIS — R300 Dysuria: Secondary | ICD-10-CM | POA: Diagnosis not present

## 2013-11-04 DIAGNOSIS — L299 Pruritus, unspecified: Secondary | ICD-10-CM | POA: Diagnosis not present

## 2013-11-14 ENCOUNTER — Other Ambulatory Visit: Payer: Medicare Other

## 2013-11-14 DIAGNOSIS — E876 Hypokalemia: Secondary | ICD-10-CM | POA: Diagnosis not present

## 2013-11-14 NOTE — Progress Notes (Signed)
Drew BMP

## 2013-11-15 LAB — BASIC METABOLIC PANEL
BUN: 9 mg/dL (ref 6–23)
CO2: 27 mEq/L (ref 19–32)
Calcium: 9.5 mg/dL (ref 8.4–10.5)
Chloride: 102 mEq/L (ref 96–112)
Creat: 1.27 mg/dL — ABNORMAL HIGH (ref 0.50–1.10)
Glucose, Bld: 86 mg/dL (ref 70–99)
Potassium: 3.6 mEq/L (ref 3.5–5.3)
SODIUM: 138 meq/L (ref 135–145)

## 2013-11-27 ENCOUNTER — Encounter: Payer: Self-pay | Admitting: Family Medicine

## 2013-11-27 ENCOUNTER — Encounter: Payer: Medicare Other | Admitting: Family Medicine

## 2013-11-27 VITALS — BP 147/81 | HR 87 | Temp 98.5°F | Ht 62.0 in | Wt 136.0 lb

## 2013-11-28 NOTE — Progress Notes (Signed)
Patient ID: Regina Sawyer, female   DOB: [birth year], 58 y.o.   MRN: [id number]  Encounter opened in error. Patient did not arrive for visit and was not seen on this date.  Nobie Putnam, [place], PGY-2

## 2013-12-02 DIAGNOSIS — Z113 Encounter for screening for infections with a predominantly sexual mode of transmission: Secondary | ICD-10-CM | POA: Diagnosis not present

## 2013-12-02 DIAGNOSIS — Z1231 Encounter for screening mammogram for malignant neoplasm of breast: Secondary | ICD-10-CM | POA: Diagnosis not present

## 2013-12-02 DIAGNOSIS — Z124 Encounter for screening for malignant neoplasm of cervix: Secondary | ICD-10-CM | POA: Diagnosis not present

## 2013-12-02 LAB — HM MAMMOGRAPHY

## 2013-12-03 ENCOUNTER — Emergency Department (INDEPENDENT_AMBULATORY_CARE_PROVIDER_SITE_OTHER)
Admission: EM | Admit: 2013-12-03 | Discharge: 2013-12-03 | Disposition: A | Discharge status: Home / Self Care | Payer: Medicare Other | Source: Home / Self Care | Attending: Family Medicine | Admitting: Family Medicine

## 2013-12-03 ENCOUNTER — Encounter (HOSPITAL_COMMUNITY): Payer: Self-pay | Admitting: Emergency Medicine

## 2013-12-03 DIAGNOSIS — G5601 Carpal tunnel syndrome, right upper limb: Secondary | ICD-10-CM

## 2013-12-03 DIAGNOSIS — G5600 Carpal tunnel syndrome, unspecified upper limb: Secondary | ICD-10-CM

## 2013-12-03 MED ORDER — DICLOFENAC POTASSIUM 50 MG PO TABS: 50.0000 mg | ORAL_TABLET | Freq: Three times a day (TID) | ORAL | Status: DC

## 2013-12-03 NOTE — ED Notes (Signed)
Pt  Reports   She  Developed  r  Hand  Pain    X   5  Days          denys  Any  specefic  Injury         Pt  Reports    She  Has  Had  Carpal  Tunnel   In past            No obvious     Deformity

## 2013-12-03 NOTE — ED Provider Notes (Signed)
CSN: [id number]     Arrival date & time 12/03/13  1036 History   First MD Initiated Contact with Patient 12/03/13 1108     Chief Complaint  Patient presents with  . Hand Pain   (Consider location/radiation/quality/duration/timing/severity/associated sxs/prior Treatment) Patient is a 58 y.o. female presenting with hand pain. The history is provided by the patient.  Hand Pain This is a recurrent problem. The current episode started more than 2 days ago (h/o trigger finger surg dr Lenon Curt, now palmar pain concern re cts.). The problem has been gradually worsening.    Past Medical History  Diagnosis Date  . Hypertension   . GERD (gastroesophageal reflux disease)   . Heart murmur   . Ulcer   . Depression   . History of bacterial infection   . Sickle cell trait   . Tenderness of female pelvic organs 2005  . H/O vaginitis 2005  . Fibroid 2005  . BV (bacterial vaginosis) 2005  . Candida vaginitis 2006  . Libido, decreased 2006  . Female pelvic pain 2006  . Dysuria 2007  . Irregular periods/menstrual cycles 2007  . Menopausal symptoms 2007  . Hx: UTI (urinary tract infection) 2007  . H/O dyspareunia 2008  . Proteinuria 2009  . Nocturia 2009  . Candida vaginitis 2009  . Atrophic vaginitis 2009  . H/O constipation 2009  . History of hemorrhoids 2009  . Vaginal irritation 2011  . H/O urinary frequency 2011   Past Surgical History  Procedure Laterality Date  . Carpal tunnel release      both wrists  . Tubal ligation     Family History  Problem Relation Age of Onset  . Diabetes Sister   . Hypertension Sister   . Diabetes Other    History  Substance Use Topics  . Smoking status: Passive Smoke Exposure - Never Smoker  . Smokeless tobacco: Never Used  . Alcohol Use: No   OB History   Grav Para Term Preterm Abortions TAB SAB Ect Mult Living   2 2 2  0 0 0 0 0 0 2     Review of Systems  Constitutional: Negative.   Musculoskeletal: Negative for back pain, gait problem,  joint swelling and neck pain.  Skin: Negative.     Allergies  Latex  Home Medications   Prior to Admission medications   Medication Sig Start Date End Date Taking? Authorizing Provider  CALCIUM PO Take by mouth.    Historical Provider, MD  diclofenac (CATAFLAM) 50 MG tablet Take 1 tablet (50 mg total) by mouth 3 (three) times daily. 12/03/13   Billy Fischer, MD  hydrochlorothiazide (HYDRODIURIL) 25 MG tablet TAKE 1 TABLET BY MOUTH EVERY DAY 10/14/13   Leeanne Rio, MD  OMEPRAZOLE PO Take by mouth.    Historical Provider, MD  potassium chloride SA (K-DUR,KLOR-CON) 20 MEQ tablet Take 2 tablets (40 mEq total) by mouth daily. 10/23/13   Leeanne Rio, MD   BP 149/87  Pulse 72  Temp(Src) 99.1 F (37.3 C) (Oral)  Resp 16  SpO2 98% Physical Exam  Nursing note and vitals reviewed. Constitutional: She is oriented to person, place, and time. She appears well-developed and well-nourished. No distress.  Musculoskeletal: She exhibits tenderness.  Pos tinel's test   Neurological: She is alert and oriented to person, place, and time.  Skin: Skin is warm and dry.    ED Course  Procedures (including critical care time) Labs Review Labs Reviewed - No data to display  Imaging  Review No results found.   MDM   1. Carpal tunnel syndrome of right wrist        Billy Fischer, MD 12/03/13 1122

## 2013-12-04 DIAGNOSIS — H53009 Unspecified amblyopia, unspecified eye: Secondary | ICD-10-CM | POA: Diagnosis not present

## 2013-12-04 DIAGNOSIS — H5010 Unspecified exotropia: Secondary | ICD-10-CM | POA: Diagnosis not present

## 2013-12-04 DIAGNOSIS — H5502 Latent nystagmus: Secondary | ICD-10-CM | POA: Diagnosis not present

## 2013-12-04 DIAGNOSIS — H26499 Other secondary cataract, unspecified eye: Secondary | ICD-10-CM | POA: Diagnosis not present

## 2013-12-16 ENCOUNTER — Ambulatory Visit: Payer: Medicare Other | Admitting: Family Medicine

## 2013-12-25 DIAGNOSIS — L293 Anogenital pruritus, unspecified: Secondary | ICD-10-CM | POA: Diagnosis not present

## 2013-12-25 DIAGNOSIS — L723 Sebaceous cyst: Secondary | ICD-10-CM | POA: Diagnosis not present

## 2014-01-10 ENCOUNTER — Ambulatory Visit (INDEPENDENT_AMBULATORY_CARE_PROVIDER_SITE_OTHER): Payer: Medicare Other | Admitting: *Deleted

## 2014-01-10 DIAGNOSIS — Z23 Encounter for immunization: Secondary | ICD-10-CM

## 2014-01-14 ENCOUNTER — Emergency Department (HOSPITAL_COMMUNITY)
Admission: EM | Admit: 2014-01-14 | Discharge: 2014-01-14 | Discharge status: Absent without leave | Payer: Medicare Other | Source: Home / Self Care

## 2014-01-15 ENCOUNTER — Emergency Department (INDEPENDENT_AMBULATORY_CARE_PROVIDER_SITE_OTHER)
Admission: EM | Admit: 2014-01-15 | Discharge: 2014-01-15 | Disposition: A | Discharge status: Home / Self Care | Payer: Medicare Other | Source: Home / Self Care

## 2014-01-15 ENCOUNTER — Encounter (HOSPITAL_COMMUNITY): Payer: Self-pay | Admitting: Emergency Medicine

## 2014-01-15 ENCOUNTER — Emergency Department (INDEPENDENT_AMBULATORY_CARE_PROVIDER_SITE_OTHER): Payer: Medicare Other

## 2014-01-15 ENCOUNTER — Ambulatory Visit: Payer: Medicare Other | Admitting: Family Medicine

## 2014-01-15 DIAGNOSIS — S6990XA Unspecified injury of unspecified wrist, hand and finger(s), initial encounter: Secondary | ICD-10-CM | POA: Diagnosis not present

## 2014-01-15 DIAGNOSIS — S6980XA Other specified injuries of unspecified wrist, hand and finger(s), initial encounter: Secondary | ICD-10-CM | POA: Diagnosis not present

## 2014-01-15 DIAGNOSIS — W2203XA Walked into furniture, initial encounter: Secondary | ICD-10-CM

## 2014-01-15 DIAGNOSIS — S6000XA Contusion of unspecified finger without damage to nail, initial encounter: Secondary | ICD-10-CM | POA: Diagnosis not present

## 2014-01-15 DIAGNOSIS — M79609 Pain in unspecified limb: Secondary | ICD-10-CM | POA: Diagnosis not present

## 2014-01-15 DIAGNOSIS — S6991XA Unspecified injury of right wrist, hand and finger(s), initial encounter: Secondary | ICD-10-CM

## 2014-01-15 MED ORDER — IBUPROFEN 600 MG PO TABS: 600.0000 mg | ORAL_TABLET | Freq: Four times a day (QID) | ORAL | Status: DC | PRN

## 2014-01-15 NOTE — ED Provider Notes (Signed)
CSN: [id number]     Arrival date & time 01/15/14  1427 History   First MD Initiated Contact with Patient 01/15/14 1525     Chief Complaint  Patient presents with  . Hand Pain  . Arm Pain   (Consider location/radiation/quality/duration/timing/severity/associated sxs/prior Treatment) HPI Comments: C/O L index finger pain after she struck it on the table 3-4 weeks ago. There is tenderness and a "knot" over the DIP extensor surface.  C/O of L deltoid soreness after receiving a Flu shot 5 d ago.   Past Medical History  Diagnosis Date  . Hypertension   . GERD (gastroesophageal reflux disease)   . Heart murmur   . Ulcer   . Depression   . History of bacterial infection   . Sickle cell trait   . Tenderness of female pelvic organs 2005  . H/O vaginitis 2005  . Fibroid 2005  . BV (bacterial vaginosis) 2005  . Candida vaginitis 2006  . Libido, decreased 2006  . Female pelvic pain 2006  . Dysuria 2007  . Irregular periods/menstrual cycles 2007  . Menopausal symptoms 2007  . Hx: UTI (urinary tract infection) 2007  . H/O dyspareunia 2008  . Proteinuria 2009  . Nocturia 2009  . Candida vaginitis 2009  . Atrophic vaginitis 2009  . H/O constipation 2009  . History of hemorrhoids 2009  . Vaginal irritation 2011  . H/O urinary frequency 2011   Past Surgical History  Procedure Laterality Date  . Carpal tunnel release      both wrists  . Tubal ligation     Family History  Problem Relation Age of Onset  . Diabetes Sister   . Hypertension Sister   . Diabetes Other    History  Substance Use Topics  . Smoking status: Passive Smoke Exposure - Never Smoker  . Smokeless tobacco: Never Used  . Alcohol Use: No   OB History   Grav Para Term Preterm Abortions TAB SAB Ect Mult Living   2 2 2  0 0 0 0 0 0 2     Review of Systems  Constitutional: Negative.  Negative for fever.  Musculoskeletal:       As per HPI  Skin: Negative.     Allergies  Latex  Home Medications   Prior  to Admission medications   Medication Sig Start Date End Date Taking? Authorizing Provider  hydrochlorothiazide (HYDRODIURIL) 25 MG tablet TAKE 1 TABLET BY MOUTH EVERY DAY 10/14/13  Yes Leeanne Rio, MD  OMEPRAZOLE PO Take by mouth.   Yes Historical Provider, MD  CALCIUM PO Take by mouth.    Historical Provider, MD  diclofenac (CATAFLAM) 50 MG tablet Take 1 tablet (50 mg total) by mouth 3 (three) times daily. 12/03/13   Billy Fischer, MD  potassium chloride SA (K-DUR,KLOR-CON) 20 MEQ tablet Take 2 tablets (40 mEq total) by mouth daily. 10/23/13   Leeanne Rio, MD   BP 147/90  Pulse 74  Temp(Src) 98 F (36.7 C) (Oral)  Resp 16  SpO2 100% Physical Exam  Nursing note and vitals reviewed. Constitutional: She is oriented to person, place, and time. She appears well-developed and well-nourished. No distress.  Neck: Normal range of motion. Neck supple.  Pulmonary/Chest: Effort normal. No respiratory distress.  Musculoskeletal: Normal range of motion.  Left index finger with tenderness to the DIP. There is a nodular lesion, 2 mm over the joint. Flex and exten against resistance is intact but not complete flexion. Distal N/V/S intact.  Mild tenderness  to the L deltoid. No observed edema, redness or other abnormalities.  Neurological: She is alert and oriented to person, place, and time.  Skin: Skin is warm and dry.  Psychiatric: She has a normal mood and affect.    ED Course  Procedures (including critical care time) Labs Review Labs Reviewed - No data to display  Imaging Review Dg Finger Index Left  01/15/2014   CLINICAL DATA:  Trauma 3 weeks ago with distal pain.  EXAM: LEFT INDEX FINGER 2+V  COMPARISON:  None.  FINDINGS: Mild joint space narrowing involving the distal interphalangeal joint. Apparent osseous irregularity about the distal portion middle phalanx on the first image is favored to be degenerative. No correlate fracture identified on other images. The lateral view is  mildly oblique.  IMPRESSION: Degenerative change, without acute osseous finding.   Electronically Signed   By: Abigail Miyamoto M.D.   On: 01/15/2014 15:52     MDM   1. Finger injury, right, initial encounter   2. Finger contusion, initial encounter     Clinical exam and hx suggest possible fracture of the DIP. Splint in POF and f/u with hand surgeon     Janne Napoleon, NP 01/15/14 1603  Janne Napoleon, NP 01/15/14 [phone]

## 2014-01-15 NOTE — Discharge Instructions (Signed)
Contusion of finger. Keep splinted and see the hand orthopedist soon A contusion is a deep bruise. Contusions happen when an injury causes bleeding under the skin. Signs of bruising include pain, puffiness (swelling), and discolored skin. The contusion may turn blue, purple, or yellow. HOME CARE   Put ice on the injured area.  Put ice in a plastic bag.  Place a towel between your skin and the bag.  Leave the ice on for 15-20 minutes, 03-04 times a day.  Only take medicine as told by your doctor.  Rest the injured area.  If possible, raise (elevate) the injured area to lessen puffiness. GET HELP RIGHT AWAY IF:   You have more bruising or puffiness.  You have pain that is getting worse.  Your puffiness or pain is not helped by medicine. MAKE SURE YOU:   Understand these instructions.  Will watch your condition.  Will get help right away if you are not doing well or get worse. Document Released: 09/21/2007 Document Revised: 06/27/2011 Document Reviewed: 02/07/2011 [facility] Patient Information 2015 [place], [place]. This information is not intended to replace advice given to you by your health care provider. Make sure you discuss any questions you have with your health care provider.

## 2014-01-15 NOTE — ED Notes (Signed)
Pt c/o pain on left 2nd digit onset x2-3 months; has noticed a knot  Also reports pain on left deltoid muscle; thinks it's due to recent flu shot she rec'd on Friday Alert, no signs of acute distress.

## 2014-01-15 NOTE — ED Provider Notes (Signed)
Medical screening examination/treatment/procedure(s) were performed by non-physician practitioner and as supervising physician I was immediately available for consultation/collaboration.  Philipp Deputy, M.D.  Harden Mo, MD 01/15/14 2147

## 2014-01-20 ENCOUNTER — Other Ambulatory Visit: Payer: Self-pay | Admitting: *Deleted

## 2014-01-21 MED ORDER — POTASSIUM CHLORIDE CRYS ER 20 MEQ PO TBCR: 40.0000 meq | EXTENDED_RELEASE_TABLET | Freq: Every day | ORAL | Status: DC

## 2014-01-21 MED ORDER — HYDROCHLOROTHIAZIDE 25 MG PO TABS: ORAL_TABLET | ORAL | Status: DC

## 2014-01-22 ENCOUNTER — Ambulatory Visit: Payer: Medicare Other | Admitting: Family Medicine

## 2014-01-23 ENCOUNTER — Encounter: Payer: Self-pay | Admitting: Family Medicine

## 2014-01-23 ENCOUNTER — Telehealth: Payer: Self-pay | Admitting: Family Medicine

## 2014-01-23 ENCOUNTER — Ambulatory Visit (INDEPENDENT_AMBULATORY_CARE_PROVIDER_SITE_OTHER): Payer: Medicare Other | Admitting: Family Medicine

## 2014-01-23 VITALS — BP 134/84 | HR 91 | Temp 98.8°F | Ht 62.0 in | Wt 134.8 lb

## 2014-01-23 DIAGNOSIS — S6992XD Unspecified injury of left wrist, hand and finger(s), subsequent encounter: Secondary | ICD-10-CM | POA: Diagnosis not present

## 2014-01-23 DIAGNOSIS — I10 Essential (primary) hypertension: Secondary | ICD-10-CM

## 2014-01-23 DIAGNOSIS — S6992XA Unspecified injury of left wrist, hand and finger(s), initial encounter: Secondary | ICD-10-CM | POA: Insufficient documentation

## 2014-01-23 DIAGNOSIS — M79602 Pain in left arm: Secondary | ICD-10-CM | POA: Diagnosis not present

## 2014-01-23 MED ORDER — HYDROCHLOROTHIAZIDE 25 MG PO TABS: ORAL_TABLET | ORAL | Status: DC

## 2014-01-23 MED ORDER — ACETAMINOPHEN-CODEINE #3 300-30 MG PO TABS: 1.0000 | ORAL_TABLET | Freq: Four times a day (QID) | ORAL | Status: DC | PRN

## 2014-01-23 MED ORDER — POTASSIUM CHLORIDE CRYS ER 20 MEQ PO TBCR: 40.0000 meq | EXTENDED_RELEASE_TABLET | Freq: Every day | ORAL | Status: DC

## 2014-01-23 NOTE — Telephone Encounter (Signed)
Received request for 90 day supply of HCTZ & kdur. Will send this in. Looking at [facility] from July, had mild AKI. Will ask Tamika to call pt tomorrow to have her come in for a BMET.  Leeanne Rio, MD

## 2014-01-23 NOTE — Assessment & Plan Note (Signed)
A: S/p possible finger fracture (xray and radiologist report reviewed; degenerative changes but fracture is ambiguous), though noncompliant with splinting and has not been able to see hand surgeon, yet. No relief with conservative measures tried, no relief with Tylenol. Exam grossly unremarkable, other than limited active ROM secondary to pain.  P: Strongly recommended use of splint, placed referral to pt's previous hand surgeon (Dr. Lenon Curt). Rx for Tylenol #3 for pain. F/u PRN otherwise.

## 2014-01-23 NOTE — Progress Notes (Signed)
   Subjective:    Patient ID: Regina Sawyer, female    DOB: 04-18-1956, 58 y.o.   MRN: [id number]  HPI: Pt presents to [facility] clinic for finger injury and arm pain after a flu shot.  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 not able to wear the finger splint; ibuprofen does not help and she gets stomach upset with NSAIDs - she has been taking Tylenol which hasn't helped much - she reports soreness, aching, throbbing pain in her finger and reduced range of motion in the finger - she denies numbness or coldness or skin breakdown of the finger  Arm pain: - pt reports pain in her left upper arm about 2-3 weeks ago - pain is described as a throbbing, aching, burning sensation inside - pt has some swelling as well - pt denies fevers / chills, difficulty breathing, swelling in mouth or throat - she has never had a flu shot reaction before  Review of Systems: As above.     Objective:   Physical Exam BP 134/84  Pulse 91  Temp(Src) 98.8 F (37.1 C) (Oral)  Ht 5\' 2"  (1.575 m)  Wt 134 lb 12.8 oz (61.145 kg)  BMI 24.65 kg/m2 Gen: well-appearing adult female MSK: left index finger tender to palpation throughout length  Bilateral hands without other frank tenderness or apparent joint instability  Pt able to flex finger fully, though reluctantly secondary to pain  Pt also able to extend fully except for about 15 degrees of flexion at DIP joint  Bilateral hands and other fingers normal in comparison Neuro: strength 5/5 in grip bilaterally, sensation intact in all fingers Ext: warm, well-perfused, cap refill normal in all extremities  Left upper arm with diffuse tenderness to even light palpation, though reported pain out  of proportion to exam  No obvious masses and no skin lesions noted over site of previous injection     Assessment & Plan:  See problem list notes.

## 2014-01-23 NOTE — Patient Instructions (Signed)
Thank you for coming in, today!  I will put in a referral to Dr. Lenon Curt for your finger. Try to use your finger splint as much as possible. I will give you a prescription for Tylenol #3 to help with the pain and with sleep.  For your arm, try to move your arm as much as possible to work out the soreness. You can also use ice and heat to help with the pain. The Tylenol #3 will help, as well.  Come back to see Dr. Ardelia Mems as you need. It may take several days to get an appointment with Dr. Lenon Curt. If you haven't heard anything by next week, give [place] a call. Please feel free to call with any questions or concerns at any time, at [phone]. --Dr. Venetia Maxon

## 2014-01-23 NOTE — Assessment & Plan Note (Signed)
Reported pain grossly out of proportion to exam, with no skin or soft tissue abnormalities seen or palpated, no vital sign abnormalities or symptoms to suggest frank infection. Recommended supportive care with arm exercises, warm or cold compresses for comfort, and use of OTC analgesics. F/u as needed. If pain persists, could consider simple imaging to check for bony pathology, but strongly doubt this is the case.

## 2014-01-24 NOTE — Telephone Encounter (Signed)
Left voice message for pt to call and schedule a lab appt.  Derl Barrow, RN

## 2014-01-30 ENCOUNTER — Other Ambulatory Visit: Payer: Medicare Other

## 2014-02-06 ENCOUNTER — Ambulatory Visit (INDEPENDENT_AMBULATORY_CARE_PROVIDER_SITE_OTHER): Payer: Medicare Other | Admitting: Family Medicine

## 2014-02-06 ENCOUNTER — Ambulatory Visit
Admission: RE | Admit: 2014-02-06 | Discharge: 2014-02-06 | Disposition: A | Discharge status: Home / Self Care | Payer: Medicare Other | Source: Ambulatory Visit | Attending: Family Medicine | Admitting: Family Medicine

## 2014-02-06 ENCOUNTER — Encounter: Payer: Self-pay | Admitting: Family Medicine

## 2014-02-06 VITALS — BP 149/94 | HR 106 | Temp 98.0°F | Ht 62.0 in | Wt 136.1 lb

## 2014-02-06 DIAGNOSIS — M25512 Pain in left shoulder: Secondary | ICD-10-CM | POA: Diagnosis not present

## 2014-02-06 DIAGNOSIS — M79602 Pain in left arm: Secondary | ICD-10-CM | POA: Diagnosis not present

## 2014-02-06 MED ORDER — GABAPENTIN 100 MG PO CAPS: 100.0000 mg | ORAL_CAPSULE | Freq: Three times a day (TID) | ORAL | Status: DC

## 2014-02-06 NOTE — Assessment & Plan Note (Signed)
L arm pain after she had a flu shot Unclear etiology but could be a complex regional pain syndrome/neuropathic type pain Cannot tolerate NSAIDs due to her stomach, states she has ulcers Trial gabapentin, discussed 1-3 tabs up to 3 times per day and side effect of somnolence Plain film Follow up with PCP in 2 weeks

## 2014-02-06 NOTE — Progress Notes (Signed)
Patient ID: Regina Sawyer, female   DOB: 1955-12-27, 58 y.o.   MRN: [id number]   HPI  Patient presents today for left arm pain  Patient had her flu shot on 01/10/2014: She states that the left arm pain started trial after that. She states that it's a throbbing burning type pain that starts at the area that was injected and radiates throughout her upper left arm. She denies any pain in her neck or radiation down her arm. She denies any worsening with physical exertion.  It worsens with touch, and moving her arm. She's tried Aspercreme, hydrocortisone, Tylenol, and Tylenol with Codeine without much effect. She states that today she would like to x-ray to make sure that her bones are okay.  She has recently seen her hand surgeon who states that she has fractured her finger.  She states that she cannot tolerate NSAIDs do to her stomach, when asked to clarify she states that she has ulcers.  Smoking status noted-never smoker ROS: Per HPI  Objective: BP 149/94  Pulse 106  Temp(Src) 98 F (36.7 C) (Oral)  Ht 5\' 2"  (1.575 m)  Wt 136 lb 1.6 oz (61.735 kg)  BMI 24.89 kg/m2 Gen: NAD, alert, cooperative with exam HEENT: NCAT Ext: No apparent edema left shoulder versus right shoulder, bilateral weakness 3-4/5 which is unclear if that's true or volitional, tenderness to palpation of the area of concern of her left shoulder which appears to be a slightly hypopigmented area approximately 1 cm in diameter circular shaped, no tenderness to palpation of her bony structures of the shoulder, full range of motion of her left shoulder Neuro: Alert and oriented, No gross deficits  Assessment and plan:  Arm pain, left L arm pain after she had a flu shot Unclear etiology but could be a complex regional pain syndrome/neuropathic type pain Cannot tolerate NSAIDs due to her stomach, states she has ulcers Trial gabapentin, discussed 1-3 tabs up to 3 times per day and side effect of somnolence Plain  film Follow up with PCP in 2 weeks    Orders Placed This Encounter  Procedures  . DG Shoulder Left    Standing Status: Future     Number of Occurrences:      Standing Expiration Date: 04/09/2015    Order Specific Question:  Reason for Exam (SYMPTOM  OR DIAGNOSIS REQUIRED)    Answer:  pain    Order Specific Question:  Is the patient pregnant?    Answer:  No    Order Specific Question:  Preferred imaging location?    Answer:  [facility]    Meds ordered this encounter  Medications  . gabapentin (NEURONTIN) 100 MG capsule    Sig: Take 1-3 capsules (100-300 mg total) by mouth 3 (three) times daily.    Dispense:  90 capsule    Refill:  3

## 2014-02-06 NOTE — Patient Instructions (Signed)
Great to  Meet you!  Sorry you are struggling with this pain, I think you may have local irritation affecting your nerves.   Please make an appointment to see your PCP in 2-3 weeks.   Please come right back if it worsens

## 2014-02-07 ENCOUNTER — Telehealth: Payer: Self-pay | Admitting: Family Medicine

## 2014-02-07 ENCOUNTER — Encounter: Payer: Self-pay | Admitting: Family Medicine

## 2014-02-07 NOTE — Telephone Encounter (Signed)
Called to discuss film results.  No acute findings. No answer so will send a letter.   Laroy Apple, MD [place] Resident, PGY-3 02/07/2014, 1:55 PM

## 2014-02-10 ENCOUNTER — Telehealth: Payer: Self-pay | Admitting: Family Medicine

## 2014-02-10 NOTE — Telephone Encounter (Signed)
Pt is requesting call back from MD to explain recent letter she received re: test results, does not understand what the letter says.

## 2014-02-10 NOTE — Telephone Encounter (Signed)
Called, left VM that I will call back.   Will attempt tomorrow.   Laroy Apple, MD [place] Resident, PGY-3 02/10/2014, 1:29 PM

## 2014-02-11 NOTE — Telephone Encounter (Signed)
Again reached VM. Will attempt tomorrow.   Laroy Apple, MD [place] Resident, PGY-3 02/11/2014, 10:21 AM

## 2014-02-12 NOTE — Telephone Encounter (Addendum)
Called again, left VM and stated I will send another letter trying to be more clear.   She returned call. States that the pain is not improved. She understands the XR findings that there is nothing in particular shown on XR that would explain her symptoms. Encouraged her to follow up with her PCP.   Laroy Apple, MD [place] Resident, PGY-3 02/12/2014, 2:28 PM

## 2014-02-17 ENCOUNTER — Encounter: Payer: Self-pay | Admitting: Family Medicine

## 2014-02-19 ENCOUNTER — Ambulatory Visit (INDEPENDENT_AMBULATORY_CARE_PROVIDER_SITE_OTHER): Payer: Medicare Other | Admitting: Family Medicine

## 2014-02-19 ENCOUNTER — Telehealth: Payer: Self-pay | Admitting: *Deleted

## 2014-02-19 ENCOUNTER — Other Ambulatory Visit (HOSPITAL_COMMUNITY): Payer: Self-pay | Admitting: Family Medicine

## 2014-02-19 ENCOUNTER — Ambulatory Visit: Payer: Medicare Other | Admitting: Family Medicine

## 2014-02-19 ENCOUNTER — Ambulatory Visit (HOSPITAL_COMMUNITY)
Admission: RE | Admit: 2014-02-19 | Discharge: 2014-02-19 | Disposition: A | Discharge status: Home / Self Care | Payer: Medicare Other | Source: Ambulatory Visit | Attending: Family Medicine | Admitting: Family Medicine

## 2014-02-19 ENCOUNTER — Encounter: Payer: Self-pay | Admitting: Family Medicine

## 2014-02-19 VITALS — BP 137/82 | HR 87 | Temp 98.1°F | Ht 62.0 in | Wt 136.1 lb

## 2014-02-19 DIAGNOSIS — M79602 Pain in left arm: Secondary | ICD-10-CM

## 2014-02-19 DIAGNOSIS — M25522 Pain in left elbow: Secondary | ICD-10-CM

## 2014-02-19 DIAGNOSIS — M79609 Pain in unspecified limb: Secondary | ICD-10-CM

## 2014-02-19 DIAGNOSIS — M79622 Pain in left upper arm: Secondary | ICD-10-CM | POA: Insufficient documentation

## 2014-02-19 MED ORDER — PREDNISONE 20 MG PO TABS: 40.0000 mg | ORAL_TABLET | Freq: Every day | ORAL | Status: AC

## 2014-02-19 MED ORDER — CYCLOBENZAPRINE HCL 10 MG PO TABS: 10.0000 mg | ORAL_TABLET | Freq: Every day | ORAL | Status: DC

## 2014-02-19 NOTE — Assessment & Plan Note (Signed)
Flexeril for spasm, steroid taper for inflammatory reaction and [place] to rule out hematoma or abscess  We will call pt with results.

## 2014-02-19 NOTE — Telephone Encounter (Signed)
Received a call from [facility] stating that pt result for negative for blood clot.  Derl Barrow, RN

## 2014-02-19 NOTE — Patient Instructions (Addendum)
I have ordered ultrasound to be completed today. We will call you with the results to this ultrasound. I have also prescribed you Flexeril, which is a muscle relaxant, you can take this up to 3 times a day but I would encourage you to only take it at bedtime because it can cause drowsiness. I have also called shoe in a prednisone prescription, you are to take this medication for 5 days only. This should help with the swelling. You are to follow-up with your PCP in 2 weeks

## 2014-02-19 NOTE — Progress Notes (Signed)
   Subjective:    Patient ID: Regina Sawyer, female    DOB: 1956-02-05, 58 y.o.   MRN: [id number]  HPI Regina Sawyer is a 58 year old female presents to same day appointment clinic  Upper arm pain: Patient presents to same-day clinic for left upper arm pain, she states that this occurred the day after she received the flu shot at the end of September. She has been seen at the clinic  twice since this for her pain. She has had an x-ray of the area, that was normal and showed no pathology or foreign body. Her prior exams have been reported as "out of proportion" pain to exam. Patient has had multiple pain complaints in the past. She was started on gabapentin at her last appointment and states she has not been taking them daily, but when she does take it she does not feel it helps. She is unable to take ibuprofen due to stomach upset in the past. She denies any fevers, nausea, vomit or other muscle aches and pains. She denies any shortness of breath. She denies erythema to the area, but states that it is mildly swollen. She describes a 10/10 pain in the deltoid area of her left shoulder.  Past Medical History  Diagnosis Date  . Hypertension   . GERD (gastroesophageal reflux disease)   . Heart murmur   . Ulcer   . Depression   . History of bacterial infection   . Sickle cell trait   . Tenderness of female pelvic organs 2005  . H/O vaginitis 2005  . Fibroid 2005  . BV (bacterial vaginosis) 2005  . Candida vaginitis 2006  . Libido, decreased 2006  . Female pelvic pain 2006  . Dysuria 2007  . Irregular periods/menstrual cycles 2007  . Menopausal symptoms 2007  . Hx: UTI (urinary tract infection) 2007  . H/O dyspareunia 2008  . Proteinuria 2009  . Nocturia 2009  . Candida vaginitis 2009  . Atrophic vaginitis 2009  . H/O constipation 2009  . History of hemorrhoids 2009  . Vaginal irritation 2011  . H/O urinary frequency 2011    Review of Systems Per history of present illness      Objective:   Physical Exam BP 137/82 mmHg  Pulse 87  Temp(Src) 98.1 F (36.7 C) (Oral)  Ht 5\' 2"  (1.575 m)  Wt 136 lb 1.6 oz (61.735 kg)  BMI 24.89 kg/m2' Gen: African-American female, no acute distress, nontoxic in appearance, well-developed, well-nourished. Splinting of left arm. HEENT: AT. [place]. Bilateral eyes without injections or icterus. MMM.. .  Ext: No erythema. No edema. Mild swelling just below deltoid. Extreme tenderness to palpation over lateral upper extremity. Tissue fullness, muscle spasm and area of most pain. Neurovascularly intact distally     Assessment & Plan:

## 2014-02-19 NOTE — Progress Notes (Signed)
VASCULAR LAB PRELIMINARY  PRELIMINARY  PRELIMINARY  PRELIMINARY  Left upper extremity venous duplex completed.    Preliminary report:  Left upper extremity venous duplex is negative for deep and superficial vein thrombosis.  No finding of a cyst or fluid collection at the area of pain.  Report called to [place], RN   Nils Pyle, Melanie Pellot, RVT 02/19/2014, 11:51 AM

## 2014-02-20 ENCOUNTER — Telehealth: Payer: Self-pay | Admitting: Family Medicine

## 2014-02-20 NOTE — Telephone Encounter (Signed)
Please call pt and inform her there are no signs of blood clot in her arm. I did want her to get a different kind of [place] that was not completed but is ordered. She should be encouraged to call the [place] department and schedule this (again the order is there). Thanks.

## 2014-02-21 ENCOUNTER — Ambulatory Visit (HOSPITAL_COMMUNITY)
Admission: RE | Admit: 2014-02-21 | Discharge: 2014-02-21 | Disposition: A | Discharge status: Home / Self Care | Payer: Medicare Other | Source: Ambulatory Visit | Attending: Diagnostic Radiology | Admitting: Diagnostic Radiology

## 2014-02-21 ENCOUNTER — Telehealth: Payer: Self-pay | Admitting: Family Medicine

## 2014-02-21 DIAGNOSIS — M79602 Pain in left arm: Secondary | ICD-10-CM | POA: Insufficient documentation

## 2014-02-21 NOTE — Telephone Encounter (Signed)
Spoke with patient and informed her of below results. Encouraged her to make and keep an appointment with PCP in 2 weeks for a follow up

## 2014-02-21 NOTE — Telephone Encounter (Signed)
Please call Regina Sawyer, her arm [place] was normal. There is no sign of infection or blood clot. She is to continue to take medications as prescribed and follow up in 2 weeks with her PCP. Thanks.

## 2014-03-05 ENCOUNTER — Encounter: Payer: Self-pay | Admitting: Family Medicine

## 2014-03-05 ENCOUNTER — Ambulatory Visit (INDEPENDENT_AMBULATORY_CARE_PROVIDER_SITE_OTHER): Payer: Medicare Other | Admitting: Family Medicine

## 2014-03-05 VITALS — BP 137/77 | HR 99 | Temp 99.2°F | Ht 62.0 in | Wt 138.4 lb

## 2014-03-05 DIAGNOSIS — M79602 Pain in left arm: Secondary | ICD-10-CM

## 2014-03-05 DIAGNOSIS — M25512 Pain in left shoulder: Secondary | ICD-10-CM

## 2014-03-05 MED ORDER — METHOCARBAMOL 500 MG PO TABS: 500.0000 mg | ORAL_TABLET | Freq: Three times a day (TID) | ORAL | Status: DC | PRN

## 2014-03-05 NOTE — Assessment & Plan Note (Addendum)
4th visit to clinic for this issue which has been persistent after flu shot in September. She has tried tylenol, flexeril (did not help), gabapentin has helped some but only taking once a day. Reviewed x-ray and ultrasound results. There have been some case reports of arm pain after vaccination shots in the UE, pain persisting for 2-5 months.  Plan:  Continue gabapentin (told she can take up to 3 times a day, as already specified on prescription), will try robaxin for muscle spasms as flexeril did not help. Referrals for PT and sports medicine, discussed concern for developing frozen shoulder and demonstrated stretching exercises in clinic today.

## 2014-03-05 NOTE — Patient Instructions (Signed)
Extremity Pain Treatment - you should: Continue taking the Gabapentin, 1-3 tablets three times a day as needed for pain. We will additionally try another muscle relaxer to see if this helps called robaxin, take this at night as it can make you sleepy. Continue trying to stretch the area as best as you can by trying to lift your arm above your head. We will make a referral to both Physical therapy and Sports medicine You should be better in: can take multiple months Call us or go to the ER if: severe pain unrelieved by tylenol/gabapentin/robaxin, sudden swelling of the arm, weakness or numbness develops in the arm.

## 2014-03-05 NOTE — Progress Notes (Signed)
   Subjective:    Patient ID: Regina Sawyer, female    DOB: 10/04/1955, 58 y.o.   MRN: [id number]  HPI  Patient presents for Same Day Appointment  CC: left arm pain  EXTREMITY PAIN  Location: left upper arm, where flu shot was given Pain started: after flu shot in September Pain is: Off and On Severity: Moderate Pain type: burning, radiates from upper arm to just above elbow Medications tried: gabapentin, flexeril, tylenol. Takes gabapentin 3 tabs once a day and it seems to help some. Recent trauma: no Similar pain previously: no (but seen 3 times in clinic for this issue)  Symptoms Redness:no Swelling:yes (resolved) Fever: no Weakness: no Weight loss: no Rash: no  Limiting her ability to lift up her left arm. Painful to left arm above head. No numbness or tingling of extremities/hands, no weakness.  Review of Systems   See HPI for ROS. All other systems reviewed and are negative.  Past medical history, surgical, family, and social history reviewed and updated in the EMR as appropriate.  Objective:  BP 137/77 mmHg  Pulse 99  Temp(Src) 99.2 F (37.3 C) (Oral)  Ht 5\' 2"  (1.575 m)  Wt 138 lb 6.4 oz (62.778 kg)  BMI 25.31 kg/m2 Vitals reviewed  General: NAD CV: RRR, normal s1s2, murmur Resp: CTAB Ext: tender to palpation left upper arm over distal deltoid. ROM of shoulder/arm intact except for arm abduction which is limited to 80*. No palpable masses over area of tenderness. Skin: 1x1cm area of patchy hypopigmentation over left lower deltoid where pain is located Neuro:  Alert and oriented. No loss of sensation over either hand, no weakness noted in hands or arms (finger adduction/abduction, biceps flexion/extension, triceps flexion/extension, grip strength).  Assessment & Plan:  See Problem List Documentation

## 2014-03-18 ENCOUNTER — Ambulatory Visit: Payer: Medicare Other | Attending: Family Medicine | Admitting: Physical Therapy

## 2014-03-18 DIAGNOSIS — M25519 Pain in unspecified shoulder: Secondary | ICD-10-CM | POA: Insufficient documentation

## 2014-03-18 DIAGNOSIS — M25612 Stiffness of left shoulder, not elsewhere classified: Secondary | ICD-10-CM | POA: Diagnosis not present

## 2014-03-18 NOTE — Therapy (Signed)
Outpatient Rehabilitation [facility] [address] [place], [place], [address] Phone: [phone]   Fax:  [phone]  Physical Therapy Evaluation  Patient Details  Name: Regina Sawyer MRN: [id number] Date of Birth: [birth year]  Encounter Date: 03/18/2014      PT End of Session - 03/18/14 1452    Visit Number 1   Number of Visits 8   Date for PT Re-Evaluation 04/15/14   PT Start Time 5009   PT Stop Time 1450   PT Time Calculation (min) 34 min   Activity Tolerance Patient limited by pain   Behavior During Therapy Restless;Anxious      Past Medical History  Diagnosis Date  . Hypertension   . GERD (gastroesophageal reflux disease)   . Heart murmur   . Ulcer   . Depression   . History of bacterial infection   . Sickle cell trait   . Tenderness of female pelvic organs 2005  . H/O vaginitis 2005  . Fibroid 2005  . BV (bacterial vaginosis) 2005  . Candida vaginitis 2006  . Libido, decreased 2006  . Female pelvic pain 2006  . Dysuria 2007  . Irregular periods/menstrual cycles 2007  . Menopausal symptoms 2007  . Hx: UTI (urinary tract infection) 2007  . H/O dyspareunia 2008  . Proteinuria 2009  . Nocturia 2009  . Candida vaginitis 2009  . Atrophic vaginitis 2009  . H/O constipation 2009  . History of hemorrhoids 2009  . Vaginal irritation 2011  . H/O urinary frequency 2011    Past Surgical History  Procedure Laterality Date  . Carpal tunnel release      both wrists  . Tubal ligation      There were no vitals taken for this visit.  Visit Diagnosis:  Localized pain of shoulder joint - Plan: PT plan of care cert/re-cert  Stiffness of shoulder joint, left - Plan: PT plan of care cert/re-cert      Subjective Assessment - 03/18/14 1423    Symptoms LT. arm pain, she has difficulty lifting arm.  She feels this began when she received a flu shot in Sept.    Limitations Lifting;House hold activities  ADLs, grooming, sleeping   Diagnostic tests XR  neg   Patient Stated Goals Move arm the way I used to.    Currently in Pain? Yes   Pain Score 7    Pain Location Arm   Pain Orientation Left   Pain Descriptors / Indicators Throbbing;Aching   Pain Type Chronic pain   Pain Onset More than a month ago   Pain Frequency Intermittent   Aggravating Factors  using Lt. UE   Pain Relieving Factors Meds   Multiple Pain Sites No          [facility] PT Assessment - 03/18/14 1430    Sensation   Light Touch Appears Intact   AROM   Right Shoulder Flexion 125 Degrees   Right Shoulder ABduction 140 Degrees   Right Shoulder Internal Rotation 75 Degrees   Right Shoulder External Rotation 90 Degrees   Left Shoulder Extension 45 Degrees   Left Shoulder Flexion 35 Degrees   Left Shoulder ABduction 35 Degrees   Left Shoulder Internal Rotation 75 Degrees   Left Shoulder External Rotation 55 Degrees   Strength   Right Shoulder Flexion 5/5   Right Shoulder ABduction 5/5   Right Shoulder Internal Rotation 5/5   Right Shoulder External Rotation 5/5   Left Shoulder Flexion 3-/5   Left Shoulder ABduction 3-/5  Left Shoulder Internal Rotation 3+/5   Left Shoulder External Rotation 3/5    Palpation: local pain in Lt. Middle deltoid at site of flu injection.  No pain in upper trap, RC mm, periscapular or in [facility] joint.  Patient very guarded and reflexively pulls away with palpation, difficulty relaxing arm for assessment.  Rt. UE guarded as well with similar limb dyskinesis. Declined Ice, heat.  Maintains she has had no shoulder pain prior to flu shot.  Full PROM in Lt. UE despite pain.  Somewhat slow to process, repeats history and anxious.         PT Education - 2014-04-15 1446    Education provided Yes   Education Details PT/POC, flu shot   Person(s) Educated Patient   Methods Explanation   Comprehension Verbalized understanding;Need further instruction            PT Long Term Goals - Apr 15, 2014 1615    PT LONG TERM GOAL #1   Title Pt. will be  able to demo corrected posture and maintain during PT session with min cues   Time 4   Period Weeks   Status New   PT LONG TERM GOAL #2   Title Pt will be able to lift Lt. UE to 120 deg for reaching, work with min pain   Time 4   Period Weeks   Status New   PT LONG TERM GOAL #3   Title Pt. will be able to sleep on Lt. side at night with min difficulty   Time 4   Period Weeks   PT LONG TERM GOAL #4   Title Pt. will be able to complete ADLs with min difficulty    Time 4   Period Weeks   Status New          Plan - April 15, 2014 1452    Clinical Impression Statement This patient presents with Lt. UE pain and decreased ROM which she believes is from flu shot.  She does present with weakness in rotator cuff mm and poor posture which may be from disuse.  SHe will benefit from skilled PT to facilitate normal movement and reduce pain.     Pt will benefit from skilled therapeutic intervention in order to improve on the following deficits Decreased coordination;Decreased range of motion;Impaired flexibility;Improper body mechanics;Postural dysfunction;Decreased activity tolerance;Pain;Impaired UE functional use;Decreased cognition;Decreased strength   Rehab Potential Good   PT Frequency 2x / week   PT Duration 4 weeks   PT Treatment/Interventions ADLs/Self Care Home Management;Moist Heat;Therapeutic activities;Patient/family education;Passive range of motion;Therapeutic exercise;Ultrasound;Manual techniques;Neuromuscular re-education;Cryotherapy;Electrical Stimulation;Functional mobility training   PT Next Visit Plan Give HEP.  Pt was anxious and declined MHP or ICE.  AAROM and manual to Lt UE to tolerance.    Consulted and Agree with Plan of Care Patient          G-Codes - 15-Apr-2014 1448    Functional Limitation Carrying, moving and handling objects   Carrying, Moving and Handling Objects Current Status [phone]) At least 60 percent but less than 80 percent impaired, limited or restricted    Carrying, Moving and Handling Objects Goal Status ([id number]) At least 40 percent but less than 60 percent impaired, limited or restricted        Problem List Patient Active Problem List   Diagnosis Date Noted  . Injury of left index finger 01/23/2014  . Arm pain, left 01/23/2014  . Cervical polyp 10/14/2013  . Vaginal itching 11/03/2012  . Cystitis 10/16/2012  . Preventative health care  07/25/2012  . HYPERLIPIDEMIA 08/26/2009  . Anxiety and depression 07/30/2008  . VAGINITIS, ATROPHIC 05/08/2008  . POSTMENOPAUSAL BLEEDING 11/29/2007  . SYSTOLIC MURMUR 09/81/1914  . HYPERTENSION, BENIGN ESSENTIAL 02/05/2007  . ANEMIA, IRON DEFICIENCY, UNSPEC. 06/15/2006  . SICKLE CELL TRAIT 06/15/2006  . RHINITIS, ALLERGIC 06/15/2006  . GASTROESOPHAGEAL REFLUX, NO ESOPHAGITIS 06/15/2006    PAA,JENNIFER 03/18/2014, 4:19 PM

## 2014-03-24 ENCOUNTER — Ambulatory Visit (INDEPENDENT_AMBULATORY_CARE_PROVIDER_SITE_OTHER): Payer: Medicare Other | Admitting: Family Medicine

## 2014-03-24 ENCOUNTER — Encounter: Payer: Self-pay | Admitting: Family Medicine

## 2014-03-24 VITALS — BP 119/75 | HR 102 | Ht 62.0 in | Wt 138.0 lb

## 2014-03-24 DIAGNOSIS — M79602 Pain in left arm: Secondary | ICD-10-CM

## 2014-03-24 NOTE — Assessment & Plan Note (Signed)
I think she has some mild to moderate use of capsulitis. Whether or not this originated at the time of the influenza shot is unclear. Certainly adhesive capsulitis in women of this age group can occur within a few days of even a very minor injury or pain. She said physical therapy yesterday I explained that would be the most likely thing to improve her range of motion, strength and pain. I did offer serial corticosteroid injections into the glenohumeral joint and explained how that works. She does not want to consider any more injections. I have her follow-up with her PCP unless she decides to change her mind and have glenohumeral joint injections as I think physical therapy is the ultimate treatment.

## 2014-03-24 NOTE — Progress Notes (Signed)
Patient ID: Regina Sawyer, female   DOB: [birth year], 58 y.o.   MRN: [id number]  Regina Sawyer - 58 y.o. female MRN [id number]  Date of birth: May 22, 1955    SUBJECTIVE:     Complaint of left upper arm pain since she got her flu shot a couple of months ago. Is not able to raise her arm above shoulder level without pain. Started physical therapy yesterday. Relates all of this to getting her flu shot and says she thinks the person gave her the flu shot "wrong". Right-hand dominant. ROS:     Pertinent review of systems: negative for fever or unusual weight change.   PERTINENT  PMH / PSH FH / / SH:  Past Medical, Surgical, Social, and Family History Reviewed & Updated in the EMR.  Pertinent findings include:  Hypertension, hyperlipidemia, GERD, anxiety and depression, history of iron deficiency anemia, edentulous. Nonsmoker. History of carpal tunnel release  OBJECTIVE: BP 119/75 mmHg  Pulse 102  Ht 5\' 2"  (1.575 m)  Wt 138 lb (62.596 kg)  BMI 25.23 kg/m2  Physical Exam:  Vital signs are reviewed. Well-developed well-nourished no acute distress SHOULDER: Left. She will actively elevate her arm only to 90 in forward plane and in abduction to 90. I can passively elevate her arm about 110 in both planes but she complains of some stiffness and pain. Internal rotation limited about 5. Distally she is neurovascularly intact. Biceps strength is 5 out of 5. The right shoulder has full range of motion and strength in all planes the rotator cuff.  ASSESSMENT & PLAN:  See problem based charting & AVS for pt instructions.

## 2014-03-25 ENCOUNTER — Ambulatory Visit: Payer: Medicare Other | Admitting: Physical Therapy

## 2014-03-25 DIAGNOSIS — M25612 Stiffness of left shoulder, not elsewhere classified: Secondary | ICD-10-CM | POA: Diagnosis not present

## 2014-03-25 DIAGNOSIS — M25519 Pain in unspecified shoulder: Secondary | ICD-10-CM

## 2014-03-25 NOTE — Patient Instructions (Signed)
Standard home exercises issued for table slides and active assisted shoulder flexion. 2 to 3 times a week with 10 seconds hold. Also instructed patient to work on the soft tissue tightness with her hand to soften and decrease swelling. Patient was told to expect pain with soft tissue work, but  Pain should ease as tissue gets softer.

## 2014-03-25 NOTE — Therapy (Signed)
Outpatient Rehabilitation [facility] [address] [place], [place], [address] Phone: [phone]   Fax:  [phone]  Physical Therapy Treatment  Patient Details  Name: Regina Sawyer MRN: [id number] Date of Birth: Jun 06, 1955  Encounter Date: 03/25/2014      PT End of Session - 03/25/14 1533    Visit Number 2   Number of Visits 8   Date for PT Re-Evaluation 04/15/14   PT Start Time 6962   PT Stop Time 1525   PT Time Calculation (min) 68 min   Activity Tolerance Patient limited by pain      Past Medical History  Diagnosis Date  . Hypertension   . GERD (gastroesophageal reflux disease)   . Heart murmur   . Ulcer   . Depression   . History of bacterial infection   . Sickle cell trait   . Tenderness of female pelvic organs 2005  . H/O vaginitis 2005  . Fibroid 2005  . BV (bacterial vaginosis) 2005  . Candida vaginitis 2006  . Libido, decreased 2006  . Female pelvic pain 2006  . Dysuria 2007  . Irregular periods/menstrual cycles 2007  . Menopausal symptoms 2007  . Hx: UTI (urinary tract infection) 2007  . H/O dyspareunia 2008  . Proteinuria 2009  . Nocturia 2009  . Candida vaginitis 2009  . Atrophic vaginitis 2009  . H/O constipation 2009  . History of hemorrhoids 2009  . Vaginal irritation 2011  . H/O urinary frequency 2011    Past Surgical History  Procedure Laterality Date  . Carpal tunnel release      both wrists  . Tubal ligation      There were no vitals taken for this visit.  Visit Diagnosis:  Localized pain of shoulder joint  Stiffness of shoulder joint, left      Subjective Assessment - 03/25/14 1432    Pain Relieving Factors pills, green alcohol, ben gay.          [facility] PT Assessment - 03/25/14 1436    AROM   Left Shoulder Flexion 81 Degrees   Left Shoulder ABduction 70 Degrees   Left Shoulder External Rotation 30 Degrees  Humerus neutral          [facility] Adult PT Treatment/Exercise - 03/25/14 1433    Shoulder  Exercises: Pulleys   Flexion --  5 minutes, fatigue   Moist Heat Therapy   Number Minutes Moist Heat 15 Minutes   Moist Heat Location --  Lateral shoulder Left.   Ultrasound   Ultrasound Location --  Lt shoulder   Ultrasound Parameters 1.2 watts/cm2   Ultrasound Goals Pain  8 minutes   Manual Therapy   Manual Therapy --  soft tissue work, very light to sore area, very sensitive.   Passive ROM 69 RE after Passive ans Active assist.   Other Manual Therapy --  taught her tomassage sore area to decrease sensitivity, pain   Shoulder Exercises: Stretch   External Rotation Stretch 5 reps   2 sets, Rom improved to 69 degrees.   Table Stretch - Flexion 5 reps  added to home exercise   Other Shoulder Stretches for home  seated Active assist flexion with her hands locked.            PT Education - 03/25/14 1533    Education provided Yes   Person(s) Educated Patient;Spouse   Methods Explanation;Demonstration;Handout   Comprehension Verbalized understanding;Returned demonstration;Need further instruction            PT Long  Term Goals - 03/25/14 1537    PT LONG TERM GOAL #1   Title Pt. will be able to demo corrected posture and maintain during PT session with min cues   Time 4   Period Weeks   Status On-going   PT LONG TERM GOAL #2   Time 4   Period Weeks   Status On-going   PT LONG TERM GOAL #3   Title Pt. will be able to sleep on Lt. side at night with min difficulty   Time 4   Period Weeks   Status On-going   PT LONG TERM GOAL #4   Title Pt. will be able to complete ADLs with min difficulty    Time 4   Period Weeks   Status On-going          Plan - 03/25/14 1535    Clinical Impression Statement Range improving.  Able to begin initial home program.  Pain continues to limit activity.   PT Next Visit Plan Review Home exercise, assess heat and [place].  Consider taping.   Consulted and Agree with Plan of Care Patient                                Problem List Patient Active Problem List   Diagnosis Date Noted  . Injury of left index finger 01/23/2014  . Arm pain, left 01/23/2014  . Cervical polyp 10/14/2013  . Vaginal itching 11/03/2012  . Cystitis 10/16/2012  . Preventative health care 07/25/2012  . HYPERLIPIDEMIA 08/26/2009  . Anxiety and depression 07/30/2008  . VAGINITIS, ATROPHIC 05/08/2008  . POSTMENOPAUSAL BLEEDING 11/29/2007  . SYSTOLIC MURMUR 95/63/8756  . HYPERTENSION, BENIGN ESSENTIAL 02/05/2007  . ANEMIA, IRON DEFICIENCY, UNSPEC. 06/15/2006  . SICKLE CELL TRAIT 06/15/2006  . RHINITIS, ALLERGIC 06/15/2006  . GASTROESOPHAGEAL REFLUX, NO ESOPHAGITIS 06/15/2006   Melvenia Needles, PTA 03/25/2014 3:39 PM Phone: [phone] Fax: [phone]  [facility] 03/25/2014, 3:39 PM

## 2014-03-27 ENCOUNTER — Ambulatory Visit: Payer: Medicare Other | Admitting: Physical Therapy

## 2014-03-27 DIAGNOSIS — M25519 Pain in unspecified shoulder: Secondary | ICD-10-CM | POA: Diagnosis not present

## 2014-03-27 DIAGNOSIS — M25612 Stiffness of left shoulder, not elsewhere classified: Secondary | ICD-10-CM | POA: Diagnosis not present

## 2014-03-27 NOTE — Therapy (Signed)
Outpatient Rehabilitation [facility] [address] [place], [place], [address] Phone: [phone]   Fax:  [phone]  Physical Therapy Treatment  Patient Details  Name: Regina Sawyer MRN: [id number] Date of Birth: 11/27/1955  Encounter Date: 03/27/2014    Past Medical History  Diagnosis Date  . Hypertension   . GERD (gastroesophageal reflux disease)   . Heart murmur   . Ulcer   . Depression   . History of bacterial infection   . Sickle cell trait   . Tenderness of female pelvic organs 2005  . H/O vaginitis 2005  . Fibroid 2005  . BV (bacterial vaginosis) 2005  . Candida vaginitis 2006  . Libido, decreased 2006  . Female pelvic pain 2006  . Dysuria 2007  . Irregular periods/menstrual cycles 2007  . Menopausal symptoms 2007  . Hx: UTI (urinary tract infection) 2007  . H/O dyspareunia 2008  . Proteinuria 2009  . Nocturia 2009  . Candida vaginitis 2009  . Atrophic vaginitis 2009  . H/O constipation 2009  . History of hemorrhoids 2009  . Vaginal irritation 2011  . H/O urinary frequency 2011    Past Surgical History  Procedure Laterality Date  . Carpal tunnel release      both wrists  . Tubal ligation      There were no vitals taken for this visit.  Visit Diagnosis:  Localized pain of shoulder joint  Stiffness of shoulder joint, left      Subjective Assessment - 03/27/14 1422    Symptoms 6/10, Lt shoulder.  Intermittant feels about the same.   Currently in Pain? Yes   Pain Score 6    Pain Location Shoulder   Pain Orientation Left   Pain Descriptors / Indicators Stabbing   Pain Type Chronic pain   Multiple Pain Sites No            [facility] Adult PT Treatment/Exercise - 03/27/14 0001    Manual Therapy   Manual Therapy --  taping kinesiotex for adhesive capsulitis, Lt shoulder     Pulleys for stretch, patient complained of fatigue.           Plan - 03/27/14 1447    Clinical Impression Statement patient requested a shorter  session today due to fatigue from working.  Focus today on Range and manual. (taping)  For inhibition and fascial correction lt shoulder.   PT Next Visit Plan assess ROM, taping, shrugs, serratus anterior, pull down, bands   Consulted and Agree with Plan of Care Patient;Family member/caregiver                               Problem List Patient Active Problem List   Diagnosis Date Noted  . Injury of left index finger 01/23/2014  . Arm pain, left 01/23/2014  . Cervical polyp 10/14/2013  . Vaginal itching 11/03/2012  . Cystitis 10/16/2012  . Preventative health care 07/25/2012  . HYPERLIPIDEMIA 08/26/2009  . Anxiety and depression 07/30/2008  . VAGINITIS, ATROPHIC 05/08/2008  . POSTMENOPAUSAL BLEEDING 11/29/2007  . SYSTOLIC MURMUR 60/63/0160  . HYPERTENSION, BENIGN ESSENTIAL 02/05/2007  . ANEMIA, IRON DEFICIENCY, UNSPEC. 06/15/2006  . SICKLE CELL TRAIT 06/15/2006  . RHINITIS, ALLERGIC 06/15/2006  . GASTROESOPHAGEAL REFLUX, NO ESOPHAGITIS 06/15/2006   Melvenia Needles, PTA 03/27/2014 2:54 PM Phone: [phone] Fax: [phone]  Regina Sawyer 03/27/2014, 2:54 PM

## 2014-04-01 ENCOUNTER — Ambulatory Visit: Payer: Medicare Other | Admitting: Physical Therapy

## 2014-04-03 ENCOUNTER — Ambulatory Visit: Payer: Medicare Other | Admitting: Physical Therapy

## 2014-04-03 DIAGNOSIS — M25519 Pain in unspecified shoulder: Secondary | ICD-10-CM | POA: Diagnosis not present

## 2014-04-03 DIAGNOSIS — M25612 Stiffness of left shoulder, not elsewhere classified: Secondary | ICD-10-CM | POA: Diagnosis not present

## 2014-04-03 NOTE — Therapy (Signed)
Outpatient Rehabilitation [facility] [address] [place], [place], [address] Phone: [phone]   Fax:  [phone]  Physical Therapy Treatment  Patient Details  Name: Regina Sawyer MRN: [id number] Date of Birth: 09-05-1955  Encounter Date: 04/03/2014      PT End of Session - 04/03/14 1453    Visit Number 4   Number of Visits 8   Date for PT Re-Evaluation 04/15/14   PT Start Time 0017   PT Stop Time 1445   PT Time Calculation (min) 25 min   Activity Tolerance Patient limited by pain      Past Medical History  Diagnosis Date  . Hypertension   . GERD (gastroesophageal reflux disease)   . Heart murmur   . Ulcer   . Depression   . History of bacterial infection   . Sickle cell trait   . Tenderness of female pelvic organs 2005  . H/O vaginitis 2005  . Fibroid 2005  . BV (bacterial vaginosis) 2005  . Candida vaginitis 2006  . Libido, decreased 2006  . Female pelvic pain 2006  . Dysuria 2007  . Irregular periods/menstrual cycles 2007  . Menopausal symptoms 2007  . Hx: UTI (urinary tract infection) 2007  . H/O dyspareunia 2008  . Proteinuria 2009  . Nocturia 2009  . Candida vaginitis 2009  . Atrophic vaginitis 2009  . H/O constipation 2009  . History of hemorrhoids 2009  . Vaginal irritation 2011  . H/O urinary frequency 2011    Past Surgical History  Procedure Laterality Date  . Carpal tunnel release      both wrists  . Tubal ligation      There were no vitals taken for this visit.  Visit Diagnosis:  Localized pain of shoulder joint  Stiffness of shoulder joint, left      Subjective Assessment - 04/03/14 1422    Symptoms 6/10 reaches behind her a little easier an moving her arm more. Tape helped a little.   Pain Score 6    Pain Location Shoulder   Pain Orientation Left   Pain Descriptors / Indicators Burning;Stabbing   Pain Type Chronic pain   Pain Onset More than a month ago   Pain Frequency Intermittent   Aggravating Factors   worse at night   Pain Relieving Factors pills, green alcohol, ben gay help   Multiple Pain Sites No            [facility] Adult PT Treatment/Exercise - 04/03/14 1424    Shoulder Exercises: Seated   Elevation 5 reps  painful   Elevation Limitations 91 degrees   Internal Rotation Limitations --  improved 3 inches after self mobilization with towel roll.   Shoulder Exercises: Pulleys   Other Pulley Exercises --  5 minutes.   Shoulder Exercises: ROM/Strengthening   Other ROM/Strengthening Exercises --  isometrics 1 second hold, 5 reps 4 directions   Other ROM/Strengthening Exercises --  band, shrugs, punch and pull down to hip for Lt 10 reps, som   Shoulder Exercises: Stretch   Internal Rotation Stretch Limitations --  passive stretch 5 reps flexion, very gentle due to pain.   Manual Therapy   Other Manual Therapy --  self mobilization for flexion and IR with towel.  Painful, b              PT Long Term Goals - 04/03/14 1457    PT LONG TERM GOAL #1   Title Pt. will be able to demo corrected posture and maintain during  PT session with min cues   Time 4   Status On-going   PT LONG TERM GOAL #2   Title Pt will be able to lift Lt. UE to 120 deg for reaching, work with min pain   Time 4   Period Weeks   Status On-going   PT LONG TERM GOAL #3   Title Pt. will be able to sleep on Lt. side at night with min difficulty   Time 4   Period Weeks   Status On-going   PT LONG TERM GOAL #4   Title Pt. will be able to complete ADLs with min difficulty    Time 4   Period Weeks   Status On-going          Plan - 04/03/14 1455    Clinical Impression Statement Again request shorter session, Motion IR imporove 3 inches with self mobs,  Pain continues to limit Exercises.   PT Next Visit Plan try nustep, door stretch.                               Problem List Patient Active Problem List   Diagnosis Date Noted  . Injury of left index finger 01/23/2014   . Arm pain, left 01/23/2014  . Cervical polyp 10/14/2013  . Vaginal itching 11/03/2012  . Cystitis 10/16/2012  . Preventative health care 07/25/2012  . HYPERLIPIDEMIA 08/26/2009  . Anxiety and depression 07/30/2008  . VAGINITIS, ATROPHIC 05/08/2008  . POSTMENOPAUSAL BLEEDING 11/29/2007  . SYSTOLIC MURMUR 15/40/0867  . HYPERTENSION, BENIGN ESSENTIAL 02/05/2007  . ANEMIA, IRON DEFICIENCY, UNSPEC. 06/15/2006  . SICKLE CELL TRAIT 06/15/2006  . RHINITIS, ALLERGIC 06/15/2006  . GASTROESOPHAGEAL REFLUX, NO ESOPHAGITIS 06/15/2006   Melvenia Needles, PTA 04/03/2014 2:58 PM Phone: [phone] Fax: [phone]  HARRIS,KAREN 04/03/2014, 2:58 PM

## 2014-04-07 ENCOUNTER — Encounter: Payer: Medicare Other | Admitting: Physical Therapy

## 2014-04-08 ENCOUNTER — Encounter: Payer: Medicare Other | Admitting: Physical Therapy

## 2014-04-21 ENCOUNTER — Ambulatory Visit: Payer: Medicare Other | Admitting: Physical Therapy

## 2014-04-22 DIAGNOSIS — R102 Pelvic and perineal pain: Secondary | ICD-10-CM | POA: Diagnosis not present

## 2014-04-22 DIAGNOSIS — L293 Anogenital pruritus, unspecified: Secondary | ICD-10-CM | POA: Diagnosis not present

## 2014-04-22 DIAGNOSIS — N952 Postmenopausal atrophic vaginitis: Secondary | ICD-10-CM | POA: Diagnosis not present

## 2014-04-22 DIAGNOSIS — R350 Frequency of micturition: Secondary | ICD-10-CM | POA: Diagnosis not present

## 2014-04-23 DIAGNOSIS — H578 Other specified disorders of eye and adnexa: Secondary | ICD-10-CM | POA: Diagnosis not present

## 2014-04-24 ENCOUNTER — Ambulatory Visit: Payer: Medicare Other | Attending: Family Medicine | Admitting: Physical Therapy

## 2014-04-24 DIAGNOSIS — M25612 Stiffness of left shoulder, not elsewhere classified: Secondary | ICD-10-CM | POA: Diagnosis not present

## 2014-04-24 DIAGNOSIS — M25519 Pain in unspecified shoulder: Secondary | ICD-10-CM | POA: Insufficient documentation

## 2014-04-24 NOTE — Patient Instructions (Signed)
External Rotation (Resistive Band)   Elbow bent at right angle, and held firmly against side. Using other arm as anchor, pull involved arm outward. Hold 2-3____ seconds. Repeat _5-15___ times. Do __1-3_ sessions per day.  Copyright  VHI. All rights reserved.

## 2014-04-24 NOTE — Therapy (Signed)
[place], [place], [address] Phone: [phone]   Fax:  [phone]  Physical Therapy Treatment  Patient Details  Name: Regina Sawyer MRN: [id number] Date of Birth: [birth year] Referring Provider:  Leeanne Rio, MD  Encounter Date: 04/24/2014      PT End of Session - 04/24/14 1500    Visit Number 5   Number of Visits 8   Date for PT Re-Evaluation 04/15/14   PT Start Time 1194   PT Stop Time 1455   PT Time Calculation (min) 41 min   Activity Tolerance Patient limited by pain   Behavior During Therapy Restless;Anxious      Past Medical History  Diagnosis Date  . Hypertension   . GERD (gastroesophageal reflux disease)   . Heart murmur   . Ulcer   . Depression   . History of bacterial infection   . Sickle cell trait   . Tenderness of female pelvic organs 2005  . H/O vaginitis 2005  . Fibroid 2005  . BV (bacterial vaginosis) 2005  . Candida vaginitis 2006  . Libido, decreased 2006  . Female pelvic pain 2006  . Dysuria 2007  . Irregular periods/menstrual cycles 2007  . Menopausal symptoms 2007  . Hx: UTI (urinary tract infection) 2007  . H/O dyspareunia 2008  . Proteinuria 2009  . Nocturia 2009  . Candida vaginitis 2009  . Atrophic vaginitis 2009  . H/O constipation 2009  . History of hemorrhoids 2009  . Vaginal irritation 2011  . H/O urinary frequency 2011    Past Surgical History  Procedure Laterality Date  . Carpal tunnel release      both wrists  . Tubal ligation      There were no vitals taken for this visit.  Visit Diagnosis:  Localized pain of shoulder joint  Stiffness of shoulder joint, left      Subjective Assessment - 04/24/14 1423    Symptoms Reaching behind painful, pain now intermittant,          [facility] PT Assessment - 04/24/14 1440    AROM   Left Shoulder Extension 30 Degrees   Left Shoulder Flexion 81 Degrees   Left Shoulder ABduction 70 Degrees    Left Shoulder External Rotation 40 Degrees    Shoulder flexion measured on pulleys.              [facility] Adult PT Treatment/Exercise - 04/24/14 1400    Shoulder Exercises: Seated   Extension Limitations 30 degrees   Retraction AAROM;15 reps  rests, painful   Row Left  yellow band, poor control.scapular.   Theraband Level (Shoulder External Rotation) --  white band, added to home exercise.   Internal Rotation Limitations 85 from pulleys   Manual Therapy   Manual Therapy --  range of motion, gentle with forearm resting on mine, For RO   Passive ROM small gains                PT Education - 04/24/14 1500    Education provided Yes   Education Details band for ER   Person(s) Educated Patient;Spouse   Methods Explanation;Demonstration;Handout   Comprehension Verbalized understanding;Returned demonstration             PT Long Term Goals - 04/24/14 1551    PT LONG TERM GOAL #1   Title Pt. will be able to demo corrected posture and maintain during PT session with min cues   Time 4   Period Weeks  Status On-going   PT LONG TERM GOAL #2   Title Pt will be able to lift Lt. UE to 120 deg for reaching, work with min pain   Time 4   Period Weeks   Status On-going   PT LONG TERM GOAL #3   Title Pt. will be able to sleep on Lt. side at night with min difficulty   Period Weeks   Status On-going   PT LONG TERM GOAL #4   Title Pt. will be able to complete ADLs with min difficulty    Time 4   Period Weeks   Status On-going               Plan - 04/24/14 1501    Clinical Impression Statement small gains in ROM, does not tolerate manual easily. Pain at end of session 5/10   PT Next Visit Plan try nustep, door stretch.  review band   Consulted and Agree with Plan of Care Patient        Problem List Patient Active Problem List   Diagnosis Date Noted  . Injury of left index finger 01/23/2014  . Arm pain, left 01/23/2014  . Cervical polyp  10/14/2013  . Vaginal itching 11/03/2012  . Cystitis 10/16/2012  . Preventative health care 07/25/2012  . HYPERLIPIDEMIA 08/26/2009  . Anxiety and depression 07/30/2008  . VAGINITIS, ATROPHIC 05/08/2008  . POSTMENOPAUSAL BLEEDING 11/29/2007  . SYSTOLIC MURMUR 94/70/7615  . HYPERTENSION, BENIGN ESSENTIAL 02/05/2007  . ANEMIA, IRON DEFICIENCY, UNSPEC. 06/15/2006  . SICKLE CELL TRAIT 06/15/2006  . RHINITIS, ALLERGIC 06/15/2006  . GASTROESOPHAGEAL REFLUX, NO ESOPHAGITIS 06/15/2006   Melvenia Needles, PTA 04/24/2014 3:54 PM Phone: [phone] Fax: [phone]   [facility] 04/24/2014, 3:54 PM  [facility] [address] [place], [place], [address] Phone: [phone]   Fax:  [phone]

## 2014-04-28 ENCOUNTER — Ambulatory Visit: Payer: Medicare Other | Admitting: Physical Therapy

## 2014-05-05 DIAGNOSIS — E569 Vitamin deficiency, unspecified: Secondary | ICD-10-CM | POA: Diagnosis not present

## 2014-05-05 DIAGNOSIS — Z0000 Encounter for general adult medical examination without abnormal findings: Secondary | ICD-10-CM | POA: Diagnosis not present

## 2014-05-05 DIAGNOSIS — R350 Frequency of micturition: Secondary | ICD-10-CM | POA: Diagnosis not present

## 2014-05-05 DIAGNOSIS — M79645 Pain in left finger(s): Secondary | ICD-10-CM | POA: Diagnosis not present

## 2014-05-08 ENCOUNTER — Ambulatory Visit: Payer: Medicare Other | Admitting: Physical Therapy

## 2014-05-08 DIAGNOSIS — M25519 Pain in unspecified shoulder: Secondary | ICD-10-CM | POA: Diagnosis not present

## 2014-05-08 DIAGNOSIS — M25612 Stiffness of left shoulder, not elsewhere classified: Secondary | ICD-10-CM

## 2014-05-08 NOTE — Therapy (Signed)
[place], [place], [address] Phone: [phone]   Fax:  [phone]  Physical Therapy Treatment  Patient Details  Name: Regina Sawyer MRN: [id number] Date of Birth: 1955-05-23 Referring Provider:  Leeanne Rio, MD  Encounter Date: 05/08/2014      PT End of Session - 05/08/14 1458    Visit Number 6   Number of Visits 8   Date for PT Re-Evaluation 04/15/14   PT Start Time 5974   PT Stop Time 1410   PT Time Calculation (min) 33 min   Activity Tolerance Patient tolerated treatment well      Past Medical History  Diagnosis Date  . Hypertension   . GERD (gastroesophageal reflux disease)   . Heart murmur   . Ulcer   . Depression   . History of bacterial infection   . Sickle cell trait   . Tenderness of female pelvic organs 2005  . H/O vaginitis 2005  . Fibroid 2005  . BV (bacterial vaginosis) 2005  . Candida vaginitis 2006  . Libido, decreased 2006  . Female pelvic pain 2006  . Dysuria 2007  . Irregular periods/menstrual cycles 2007  . Menopausal symptoms 2007  . Hx: UTI (urinary tract infection) 2007  . H/O dyspareunia 2008  . Proteinuria 2009  . Nocturia 2009  . Candida vaginitis 2009  . Atrophic vaginitis 2009  . H/O constipation 2009  . History of hemorrhoids 2009  . Vaginal irritation 2011  . H/O urinary frequency 2011    Past Surgical History  Procedure Laterality Date  . Carpal tunnel release      both wrists  . Tubal ligation      There were no vitals taken for this visit.  Visit Diagnosis:  Localized pain of shoulder joint  Stiffness of shoulder joint, left      Subjective Assessment - 05/08/14 1422    Symptoms Has been doing her home exercises. No pain  ER band helped the mose   Currently in Pain? No/denies   Pain Score 0-No pain                    [facility] Adult PT Treatment/Exercise - 05/08/14 1420    Shoulder Exercises: Seated   Theraband  Level (Shoulder Elevation) Level 1 (Yellow)  progressed from white to yellow band   Elevation Limitations 120 degrees   Theraband Level (Shoulder Extension) --  level 0, added to home   Horizontal ABduction Both;10 reps  white band, added to home   Theraband Level (Shoulder External Rotation) Level 1 (Yellow)  10 reps   External Rotation Limitations Reaches T4   Internal Rotation Limitations Reaches T6   Flexion AROM  120 degrees   ABduction Limitations 115 degrees abduction                PT Education - 05/08/14 1457    Education provided Yes   Education Details scapula band unattached added to home.     Person(s) Educated Patient;Spouse   Methods Explanation;Demonstration;Handout   Comprehension Verbalized understanding;Returned demonstration             PT Long Term Goals - 05/08/14 1613    PT LONG TERM GOAL #1   Title Pt. will be able to demo corrected posture and maintain during PT session with min cues   Time 4   Period Weeks   Status Partially Met   PT LONG TERM GOAL #2   Title Pt  will be able to lift Lt. UE to 120 deg for reaching, work with min pain   Time 4   Period Weeks   Status Achieved   PT LONG TERM GOAL #3   Title Pt. will be able to sleep on Lt. side at night with min difficulty   Time 4   Period Weeks   Status Achieved   PT LONG TERM GOAL #4   Title Pt. will be able to complete ADLs with min difficulty    Time 4   Period Weeks   Status Achieved               Plan - 28-May-2014 1459    Clinical Impression Statement Zerita Boers dash done.  PT to figure out score.  Most goals met , no pain  Patient says she is ready for discharge.   PT Next Visit Plan D/C to home exercise program at patient's request   Consulted and Agree with Plan of Care Patient          G-Codes - 05-28-2014 1638    Functional Assessment Tool Used DASH   Functional Limitation Carrying, moving and handling objects   Carrying, Moving and Handling Objects Current  Status (H2257) At least 1 percent but less than 20 percent impaired, limited or restricted   Carrying, Moving and Handling Objects Goal Status (D0518) At least 40 percent but less than 60 percent impaired, limited or restricted   Carrying, Moving and Handling Objects Discharge Status [id number]) At least 1 percent but less than 20 percent impaired, limited or restricted      Problem List Patient Active Problem List   Diagnosis Date Noted  . Injury of left index finger 01/23/2014  . Arm pain, left 01/23/2014  . Cervical polyp 10/14/2013  . Vaginal itching 11/03/2012  . Cystitis 10/16/2012  . Preventative health care 07/25/2012  . HYPERLIPIDEMIA 08/26/2009  . Anxiety and depression 07/30/2008  . VAGINITIS, ATROPHIC 05/28/2008  . POSTMENOPAUSAL BLEEDING 11/29/2007  . SYSTOLIC MURMUR 51/89/8421  . HYPERTENSION, BENIGN ESSENTIAL 02/05/2007  . ANEMIA, IRON DEFICIENCY, UNSPEC. 06/15/2006  . SICKLE CELL TRAIT 06/15/2006  . RHINITIS, ALLERGIC 06/15/2006  . GASTROESOPHAGEAL REFLUX, NO ESOPHAGITIS 06/15/2006   Melvenia Needles, PTA 28-May-2014 4:38 PM Phone: [phone] Fax: [phone]  PAA,JENNIFER 2014-05-28, 4:38 PM  [place], [place], [address] Phone: [phone]   Fax:  [phone]   PHYSICAL THERAPY RENEWAL/DISCHARGE SUMMARY  Visits from Start of Care: 6 Current functional level related to goals / functional outcomes: See above for goals met   Remaining deficits: Not limiting function per patient (ROM, strength)   Education / Equipment: HEP, posture and RICE  Plan: Patient agrees to discharge.  Patient goals were met. Patient is being discharged due to the patient's request.  ?????    Raeford Razor, PT May 28, 2014 4:38 PM Phone: [phone] Fax: [phone]

## 2014-05-09 ENCOUNTER — Other Ambulatory Visit: Payer: Self-pay | Admitting: Family Medicine

## 2014-05-12 ENCOUNTER — Encounter: Payer: Self-pay | Admitting: Family Medicine

## 2014-05-12 ENCOUNTER — Ambulatory Visit: Payer: Medicare Other | Admitting: Family Medicine

## 2014-05-12 ENCOUNTER — Ambulatory Visit (INDEPENDENT_AMBULATORY_CARE_PROVIDER_SITE_OTHER): Payer: Medicare Other | Admitting: Family Medicine

## 2014-05-12 VITALS — BP 157/92 | HR 90 | Temp 98.4°F | Ht 62.0 in | Wt 137.8 lb

## 2014-05-12 DIAGNOSIS — E785 Hyperlipidemia, unspecified: Secondary | ICD-10-CM

## 2014-05-12 DIAGNOSIS — I10 Essential (primary) hypertension: Secondary | ICD-10-CM

## 2014-05-12 DIAGNOSIS — R7309 Other abnormal glucose: Secondary | ICD-10-CM

## 2014-05-12 DIAGNOSIS — R631 Polydipsia: Secondary | ICD-10-CM

## 2014-05-12 DIAGNOSIS — R739 Hyperglycemia, unspecified: Secondary | ICD-10-CM

## 2014-05-12 DIAGNOSIS — R748 Abnormal levels of other serum enzymes: Secondary | ICD-10-CM | POA: Diagnosis not present

## 2014-05-12 DIAGNOSIS — R7989 Other specified abnormal findings of blood chemistry: Secondary | ICD-10-CM

## 2014-05-12 DIAGNOSIS — R7303 Prediabetes: Secondary | ICD-10-CM

## 2014-05-12 LAB — LIPID PANEL
Cholesterol: 172 mg/dL (ref 0–200)
HDL: 52 mg/dL (ref >39)
LDL Cholesterol: 100 mg/dL — ABNORMAL HIGH (ref 0–99)
Total CHOL/HDL Ratio: 3.3 Ratio
Triglycerides: 99 mg/dL (ref <150)
VLDL: 20 mg/dL (ref 0–40)

## 2014-05-12 LAB — BASIC METABOLIC PANEL
BUN: 12 mg/dL (ref 6–23)
CO2: 30 mEq/L (ref 19–32)
Calcium: 9.9 mg/dL (ref 8.4–10.5)
Chloride: 103 mEq/L (ref 96–112)
Creat: 1.07 mg/dL (ref 0.50–1.10)
GLUCOSE: 103 mg/dL — AB (ref 70–99)
Potassium: 4.1 mEq/L (ref 3.5–5.3)
Sodium: 141 mEq/L (ref 135–145)

## 2014-05-12 LAB — POCT GLYCOSYLATED HEMOGLOBIN (HGB A1C): HEMOGLOBIN A1C: 5.7

## 2014-05-12 NOTE — Patient Instructions (Signed)
We are getting labs today and I will call if any are NOT normal.  Drink plenty of water (6-8 glasses) daily.   Cut down on sweet beverages including soda and sweet tea to no more than one 8-oz glass daily.  Check 3 BP values at home and call with these numbers in 1-2 weeks.  Follow up with Dr Ardelia Mems in 3-4 weeks.  Best,  H[place] Sinclair, MD  DASH Eating Plan DASH stands for "Dietary Approaches to Stop Hypertension." The DASH eating plan is a healthy eating plan that has been shown to reduce high blood pressure (hypertension). Additional health benefits may include reducing the risk of type 2 diabetes mellitus, heart disease, and stroke. The DASH eating plan may also help with weight loss. WHAT DO I NEED TO KNOW ABOUT THE DASH EATING PLAN? For the DASH eating plan, you will follow these general guidelines:  Choose foods with a percent daily value for sodium of less than 5% (as listed on the food label).  Use salt-free seasonings or herbs instead of table salt or sea salt.  Check with your health care provider or pharmacist before using salt substitutes.  Eat lower-sodium products, often labeled as "lower sodium" or "no salt added."  Eat fresh foods.  Eat more vegetables, fruits, and low-fat dairy products.  Choose whole grains. Look for the word "whole" as the first word in the ingredient list.  Choose fish and skinless chicken or [place] more often than red meat. Limit fish, poultry, and meat to 6 oz (170 g) each day.  Limit sweets, desserts, sugars, and sugary drinks.  Choose heart-healthy fats.  Limit cheese to 1 oz (28 g) per day.  Eat more home-cooked food and less restaurant, buffet, and fast food.  Limit fried foods.  Cook foods using methods other than frying.  Limit canned vegetables. If you do use them, rinse them well to decrease the sodium.  When eating at a restaurant, ask that your food be prepared with less salt, or no salt if possible. WHAT FOODS  CAN I EAT? Seek help from a dietitian for individual calorie needs. Grains Whole grain or whole wheat bread. Brown rice. Whole grain or whole wheat pasta. Quinoa, bulgur, and whole grain cereals. Low-sodium cereals. Corn or whole wheat flour tortillas. Whole grain cornbread. Whole grain crackers. Low-sodium crackers. Vegetables Fresh or frozen vegetables (raw, steamed, roasted, or grilled). Low-sodium or reduced-sodium tomato and vegetable juices. Low-sodium or reduced-sodium tomato sauce and paste. Low-sodium or reduced-sodium canned vegetables.  Fruits All fresh, canned (in natural juice), or frozen fruits. Meat and Other Protein Products Ground beef (85% or leaner), grass-fed beef, or beef trimmed of fat. Skinless chicken or [place]. Ground chicken or [place]. Pork trimmed of fat. All fish and seafood. Eggs. Dried beans, peas, or lentils. Unsalted nuts and seeds. Unsalted canned beans. Dairy Low-fat dairy products, such as skim or 1% milk, 2% or reduced-fat cheeses, low-fat ricotta or cottage cheese, or plain low-fat yogurt. Low-sodium or reduced-sodium cheeses. Fats and Oils Tub margarines without trans fats. Light or reduced-fat mayonnaise and salad dressings (reduced sodium). Avocado. Safflower, olive, or canola oils. Natural peanut or almond butter. Other Unsalted popcorn and pretzels. The items listed above may not be a complete list of recommended foods or beverages. Contact your dietitian for more options. WHAT FOODS ARE NOT RECOMMENDED? Grains White bread. White pasta. White rice. Refined cornbread. Bagels and croissants. Crackers that contain trans fat. Vegetables Creamed or fried vegetables. Vegetables in a cheese sauce.  Regular canned vegetables. Regular canned tomato sauce and paste. Regular tomato and vegetable juices. Fruits Dried fruits. Canned fruit in light or heavy syrup. Fruit juice. Meat and Other Protein Products Fatty cuts of meat. Ribs, chicken wings, bacon, sausage,  bologna, salami, chitterlings, fatback, hot dogs, bratwurst, and packaged luncheon meats. Salted nuts and seeds. Canned beans with salt. Dairy Whole or 2% milk, cream, half-and-half, and cream cheese. Whole-fat or sweetened yogurt. Full-fat cheeses or blue cheese. Nondairy creamers and whipped toppings. Processed cheese, cheese spreads, or cheese curds. Condiments Onion and garlic salt, seasoned salt, table salt, and sea salt. Canned and packaged gravies. Worcestershire sauce. Tartar sauce. Barbecue sauce. Teriyaki sauce. Soy sauce, including reduced sodium. Steak sauce. Fish sauce. Oyster sauce. Cocktail sauce. Horseradish. Ketchup and mustard. Meat flavorings and tenderizers. Bouillon cubes. Hot sauce. Tabasco sauce. Marinades. Taco seasonings. Relishes. Fats and Oils Butter, stick margarine, lard, shortening, ghee, and bacon fat. Coconut, palm kernel, or palm oils. Regular salad dressings. Other Pickles and olives. Salted popcorn and pretzels. The items listed above may not be a complete list of foods and beverages to avoid. Contact your dietitian for more information. WHERE CAN I FIND MORE INFORMATION? National Heart, Lung, and Blood Institute: travelstabloid.com Document Released: 03/24/2011 Document Revised: 08/19/2013 Document Reviewed: 02/06/2013 [facility] Patient Information 2015 [place], [place]. This information is not intended to replace advice given to you by your health care provider. Make sure you discuss any questions you have with your health care provider.

## 2014-05-12 NOTE — Progress Notes (Signed)
Patient ID: Regina Sawyer, female   DOB: 09/15/1955, 59 y.o.   MRN: [id number] Subjective:   CC: kidney issue  HPI:   Kidney issue Concern for diabetes Patient presents today for follow up on kidney issue. She reports that she was told by her OB (per pt, Dr Florene Glen) to get checked for kidney problems and diabetes. I do not see any charting on this but do see Cr 1.27 July up from ~1 baseline. She states she drinks pepsi multiple times daily. She reports a history of borderline diabetes. She denies urinary frequency, urgency, chest pain, dyspnea, leg swelling, or syncope. She has had some dry mouth with increased thirst that has resolved.  Hypertension Patient takes HCTZ daily including this morning. Most recent creatinine 1.27 in July from baseline ~1. Asymptomatic per above. Does not check BP at home.   Review of Systems - Per HPI.   PMH: HLD, anemia, sickle cell trait, anxiety/depression, HTN, GERD, systolic murmur Smoking status: She does not drink alcohol and is a nonsmoker.     Objective:  Physical Exam BP 157/92 mmHg  Pulse 90  Temp(Src) 98.4 F (36.9 C) (Oral)  Wt 137 lb 12.8 oz (62.506 kg) GEN: NAD HEENT: AT/[place], sclera clear, EOMI, o/p mild erythematous with no purulence, TMs mildly retracted with clear fluid and no erythema, no LAD, neck supple CV: RRR with II/VI systolic murmur, 2+ B radial pulses PULM: CTAB, normal effort EXTR: No LE edema or calf tenderness    Assessment:     Regina Sawyer is a 59 y.o. female here for "kidney issue" she was told to see PCP for.    Plan:     # See problem list and after visit summary for problem-specific plans.   Follow-up: Follow up in 3-4 weeks with PCP to f/u HTN.   Hilton Sinclair, MD [place]

## 2014-05-13 ENCOUNTER — Telehealth: Payer: Self-pay | Admitting: Family Medicine

## 2014-05-13 DIAGNOSIS — E785 Hyperlipidemia, unspecified: Secondary | ICD-10-CM

## 2014-05-13 DIAGNOSIS — R7303 Prediabetes: Secondary | ICD-10-CM | POA: Insufficient documentation

## 2014-05-13 DIAGNOSIS — H578 Other specified disorders of eye and adnexa: Secondary | ICD-10-CM | POA: Diagnosis not present

## 2014-05-13 DIAGNOSIS — H538 Other visual disturbances: Secondary | ICD-10-CM | POA: Diagnosis not present

## 2014-05-13 NOTE — Assessment & Plan Note (Signed)
Reported history of prediabetes. Consumes many sweet beverages daily. - Decrease sweet beverage intake to no more than 8 oz daily. - A1c - lipid panel

## 2014-05-13 NOTE — Assessment & Plan Note (Signed)
Most recent creatinine 1.27 up from baseline of 1. Patient was told by OB to get creatinine rechecked. - BMET - discussed cutting down on sweet beverages and drinking more water.

## 2014-05-13 NOTE — Telephone Encounter (Signed)
Please call and let Ms Cratty know that her kidney function is normal. She should be sure to stay hydrated with water like we discussed yesterday.  Her A1c is just barely prediabetic (5.7). She should cut back on sweet beverages like sodas that she said she mostly drinks, and drink much more water.  Her cholesterol is a high enough level (10 year ASCVD risk 9.4%) that I would recommend atorvastatin or crestor. If she is amenable, I can send this medication in. Side effects are rare but include myalgias.  Thanks,   Hilton Sinclair, MD

## 2014-05-13 NOTE — Assessment & Plan Note (Signed)
Inadequate control based on office reading of 157/92. Taking HCTZ regularly. - BMET - check 3 BP at home and call in 1-2 weeks with these values. - DASH diet information provided. - F/u 3-4 weeks with Dr Ardelia Mems.

## 2014-05-13 NOTE — Telephone Encounter (Signed)
LMOVM for pt to return call .Fleeger, Jessica Dawn  

## 2014-05-14 MED ORDER — ATORVASTATIN CALCIUM 40 MG PO TABS: 40.0000 mg | ORAL_TABLET | Freq: Every day | ORAL | Status: DC

## 2014-05-14 NOTE — Telephone Encounter (Signed)
Sent in atorvastatin 40mg  daily. Forwarding to PCP.  Hilton Sinclair, MD

## 2014-05-14 NOTE — Addendum Note (Signed)
Addended by: Conni Slipper T on: 05/14/2014 12:32 PM   Modules accepted: Orders

## 2014-05-14 NOTE — Telephone Encounter (Signed)
Pt voiced understanding of her results and she would like to start cholesterol medication. Pharmacy verified in the computer. Carlo Guevarra,CMA

## 2014-05-16 DIAGNOSIS — N952 Postmenopausal atrophic vaginitis: Secondary | ICD-10-CM | POA: Diagnosis not present

## 2014-05-16 DIAGNOSIS — K219 Gastro-esophageal reflux disease without esophagitis: Secondary | ICD-10-CM | POA: Diagnosis not present

## 2014-05-16 DIAGNOSIS — Z6823 Body mass index (BMI) 23.0-23.9, adult: Secondary | ICD-10-CM | POA: Diagnosis not present

## 2014-05-16 DIAGNOSIS — R143 Flatulence: Secondary | ICD-10-CM | POA: Diagnosis not present

## 2014-05-19 ENCOUNTER — Encounter: Payer: Self-pay | Admitting: Family Medicine

## 2014-05-19 ENCOUNTER — Ambulatory Visit (INDEPENDENT_AMBULATORY_CARE_PROVIDER_SITE_OTHER): Payer: Medicare Other | Admitting: Family Medicine

## 2014-05-19 VITALS — BP 137/87 | HR 109 | Temp 99.4°F | Wt 125.3 lb

## 2014-05-19 DIAGNOSIS — T466X5A Adverse effect of antihyperlipidemic and antiarteriosclerotic drugs, initial encounter: Secondary | ICD-10-CM

## 2014-05-19 DIAGNOSIS — K219 Gastro-esophageal reflux disease without esophagitis: Secondary | ICD-10-CM

## 2014-05-19 MED ORDER — RANITIDINE HCL 150 MG PO TABS: 150.0000 mg | ORAL_TABLET | Freq: Two times a day (BID) | ORAL | Status: DC

## 2014-05-19 NOTE — Patient Instructions (Addendum)
Thank you for coming in, today!  I think your symptoms are from the atorvastatin. STOP taking it completely. It should get out of your system in a few days. You should start feeling better though this week. I don't think we need to check any lab work, right now, but Dr. Ardelia Mems might check some blood in a couple of weeks. You can take Zantac (ranitidine) twice per day for a couple of weeks to help with the reflux symptoms.  Come back to see Dr. Ardelia Mems as scheduled. If you have more problems in the meantime, call or come back sooner.  Please feel free to call with any questions or concerns at any time, at [phone]. --Dr. Venetia Maxon

## 2014-05-19 NOTE — Progress Notes (Signed)
   Subjective:    Patient ID: Regina Sawyer, female    DOB: 09/20/1955, 59 y.o.   MRN: [id number]  HPI: Pt presents to clinic for SDA concerned for medication reaction to recently starting Lipitor. She started this recently on 1/26 after seeing Dr. Dianah Field, and she started having symptoms the next day. She has "gassy pain," cramping in her stomach, arm and leg aching soreness, increased flatulence, but no numbness / weakness, falls, or N/V. Her appetite has been very down and she has lost weight in the past couple of weeks (unsure exactly how much). She took the Lipitor yesterday but stopped today. She has not been sick, otherwise, and all her symptoms started after taking the Lipitor. She is taking all her other medications without any changes.  Review of Systems: As above.     Objective:   Physical Exam BP 137/87 mmHg  Pulse 109  Temp(Src) 99.4 F (37.4 C) (Oral)  Wt 125 lb 4.8 oz (56.836 kg) Gen: well-appearing adult female, appears older than stated age, in NAD HEENT: [place]/AT, EOMI Cardio: RRR, no murmur appreciated Pulm: CTAB, no wheezes Abd: soft, nontender other than very mild tenderness over epigastrium, BS+ Ext: warm, well-perfused  Upper arms and thighs mildly tender to palpation Neuro: alert, oriented, normal strength throughout, normal sensation grossly to light touch  Able to sit / stand / change position / ambulate without assistance     Assessment & Plan:  59yo female with likely adverse reaction to atorvastatin; also with some worsened GERD-type symptoms - instructed pt to continue to hold atovastatin indefinitely; added to intolerance list and removed from med list - no frank scleral icterus, no report of darker-than-normal urine, and no abd tenderness, so doubt need for lab draw (strongly doubt frank rhabdo) - pt to f/u with PCP Dr. Ardelia Mems in about 2 weeks; instructed her to monitor for worsening symptoms, follow up sooner, if needed, but otherwise to discuss  possibly using different cholesterol medication with Dr. Ardelia Mems - otherwise gave Rx for Zantac for worsened GERD-type symptoms for 2-week trial, in addition to continuing prior omeprazole Rx  Note FYI to PCP Dr. Lauree Chandler, MD PGY-3, [facility] 05/19/2014, 7:52 PM

## 2014-06-04 ENCOUNTER — Telehealth: Payer: Self-pay | Admitting: Family Medicine

## 2014-06-04 ENCOUNTER — Encounter: Payer: Self-pay | Admitting: Family Medicine

## 2014-06-04 ENCOUNTER — Ambulatory Visit (INDEPENDENT_AMBULATORY_CARE_PROVIDER_SITE_OTHER): Payer: Medicare Other | Admitting: Family Medicine

## 2014-06-04 VITALS — BP 128/77 | HR 96 | Temp 98.1°F | Ht 62.0 in | Wt 126.8 lb

## 2014-06-04 DIAGNOSIS — E785 Hyperlipidemia, unspecified: Secondary | ICD-10-CM

## 2014-06-04 DIAGNOSIS — K219 Gastro-esophageal reflux disease without esophagitis: Secondary | ICD-10-CM

## 2014-06-04 DIAGNOSIS — R634 Abnormal weight loss: Secondary | ICD-10-CM | POA: Diagnosis not present

## 2014-06-04 MED ORDER — RANITIDINE HCL 150 MG PO TABS: 150.0000 mg | ORAL_TABLET | Freq: Two times a day (BID) | ORAL | Status: DC

## 2014-06-04 MED ORDER — OMEPRAZOLE 20 MG PO CPDR: 20.0000 mg | DELAYED_RELEASE_CAPSULE | Freq: Every day | ORAL | Status: DC

## 2014-06-04 NOTE — Telephone Encounter (Signed)
Okay, please call pt then and explain need for consent. Then mail consent form to patient and ask her to mail signed form back to [place]. Thanks, Leeanne Rio, MD

## 2014-06-04 NOTE — Progress Notes (Signed)
Patient ID: Regina Sawyer, female   DOB: 07-Dec-1955, 59 y.o.   MRN: [id number]  HPI:  Weight loss & abdominal discomfort: has had sensation of having a lot of gas in stomach. Down 10 lb from about a month ago. No fevers, blood in stool, night sweats, cough. Nonsmoker. Reports having normal colonoscopy in the past, possibly around 2004. No records of this visible in our system (only item is a scanned GI consult note from 03/12/2008 saying a colonoscopy was planned in 2009). Pt has been passing a lot of gas and burping a lot. She recently was started on lipitor for cardiovascular risk reduction and had an adverse reaction to it, so stopped this. At that visit was given rx for ranitidine, which pt has been taking. It has helped some, but not fully. At that visit she also stopped taking her omeprazole because she wasn't sure if she could take omeprazole and ranitidine concurrently. She has also been eating less out of anxiety around food after being told she has prediabetes. Has irregular BM's with help of laxatives. She is not trying to lose weight. Has not had abdominal pain, just gaseous discomfort.  ROS: See HPI.  [place]: hx iron def anemia, GERD, sickle cell trait, anx/dep, HLD, HTN, prediabetes  PHYSICAL EXAM: BP 128/77 mmHg  Pulse 96  Temp(Src) 98.1 F (36.7 C) (Oral)  Ht 5\' 2"  (1.575 m)  Wt 126 lb 12.8 oz (57.516 kg)  BMI 23.19 kg/m2 Gen: NAD HEENT: NCAT Heart: RRR, 2/6 systolic murmur loudest at LUSB (appears to have been noted previously) Lungs: CTAB, NWOB Abd: soft NTTP no masses or organomegaly Ext: No appreciable lower extremity edema bilaterally   ASSESSMENT/PLAN:  Weight loss Unintentional 10lb weight loss over the last month. This seems to be explained by decreased PO intake for two reasons: increased gaseous feeling/stomach discomfort in setting of stopping PPI, and anxiety around food after being told she has prediabetes.  Plan: - Will add back omeprazole in addition to  ranitidine.  - Also encouraged pt to eat regularly, reassured about her A1c being well below cutoff for diabetes.  - Obtain prior records of colonoscopy from Dr. Ulyses Amor office. - Pt to return in 3 weeks for recheck of weight. If weight continues to fall at that time, will start workup to include CMET, CBC, TSH, and other testing as clinically indicated.   GASTROESOPHAGEAL REFLUX, NO ESOPHAGITIS Increased upper GI sx's after stopping omeprazole. Add back omeprazole 20mg  daily and continue ranitidine 150mg  BID. F/u 3 weeks to eval for improvement.   Hyperlipidemia Adverse rxn to starting lipitor (gaseous feeling, leg cramps) so this was stopped. Could consider trial of different statin (possibly crestor) in future.    FOLLOW UP: F/u in 3 weeks for weight loss  [place]. Ardelia Mems, [place]

## 2014-06-04 NOTE — Telephone Encounter (Signed)
[place] red team, please call Dr. Ulyses Amor office (GI) and ask them to fax over any colonoscopy results they have for pt.  Thanks! Leeanne Rio, MD

## 2014-06-04 NOTE — Assessment & Plan Note (Signed)
Unintentional 10lb weight loss over the last month. This seems to be explained by decreased PO intake for two reasons: increased gaseous feeling/stomach discomfort in setting of stopping PPI, and anxiety around food after being told she has prediabetes.  Plan: - Will add back omeprazole in addition to ranitidine.  - Also encouraged pt to eat regularly, reassured about her A1c being well below cutoff for diabetes.  - Obtain prior records of colonoscopy from Dr. Ulyses Amor office. - Pt to return in 3 weeks for recheck of weight. If weight continues to fall at that time, will start workup to include CMET, CBC, TSH, and other testing as clinically indicated.

## 2014-06-04 NOTE — Patient Instructions (Signed)
It was great to see you again today!  Take ranitidine 150mg  twice a day Take omeprazole 20mg  every day Come back in 3 weeks so we can recheck your weight It's okay to eat normally Come back sooner if worsening or worried  Be well, Dr. Ardelia Mems

## 2014-06-04 NOTE — Assessment & Plan Note (Signed)
Increased upper GI sx's after stopping omeprazole. Add back omeprazole 20mg  daily and continue ranitidine 150mg  BID. F/u 3 weeks to eval for improvement.

## 2014-06-04 NOTE — Telephone Encounter (Signed)
Pt needs to go sign a release before we can get results. Deseree Kennon Holter, CMA

## 2014-06-04 NOTE — Assessment & Plan Note (Signed)
Adverse rxn to starting lipitor (gaseous feeling, leg cramps) so this was stopped. Could consider trial of different statin (possibly crestor) in future.

## 2014-06-10 NOTE — Telephone Encounter (Signed)
LMOVM for pt to call us about about message below. Deseree Kennon Holter, CMA

## 2014-06-13 NOTE — Telephone Encounter (Signed)
Tried again. LMOVM. Deseree Kennon Holter, CMA

## 2014-06-18 ENCOUNTER — Ambulatory Visit (INDEPENDENT_AMBULATORY_CARE_PROVIDER_SITE_OTHER): Payer: Medicare Other | Admitting: Family Medicine

## 2014-06-18 ENCOUNTER — Encounter: Payer: Self-pay | Admitting: Family Medicine

## 2014-06-18 VITALS — BP 138/74 | HR 87 | Temp 98.1°F | Ht 62.0 in | Wt 127.8 lb

## 2014-06-18 DIAGNOSIS — R634 Abnormal weight loss: Secondary | ICD-10-CM

## 2014-06-18 DIAGNOSIS — K219 Gastro-esophageal reflux disease without esophagitis: Secondary | ICD-10-CM | POA: Diagnosis not present

## 2014-06-18 NOTE — Patient Instructions (Signed)
I'm glad you're doing better. Follow-up in 2 months so that we can see how you are doing and make sure your weight continues to go up. Continue with the ranitidine and omeprazole. Call us with any questions.  Be well, Dr. Ardelia Mems

## 2014-06-21 NOTE — Assessment & Plan Note (Signed)
Much improved w/ ranitidine and PPI. Continue current regimen.

## 2014-06-21 NOTE — Assessment & Plan Note (Signed)
Weight now up. Feeling better. No further workup at this time. F/u in 2 months to recheck weight & ensure still doing well.

## 2014-06-21 NOTE — Progress Notes (Signed)
Patient ID: Regina Sawyer, female   DOB: [birth year], 59 y.o.   MRN: [id number]  HPI:  F/u weight loss and abd discomfort: doing much better. Eating well. Abdominal discomfort/gaseous feeling has resolved. Taking omeprazole and ranitidine. No other concerns today.  ROS: See HPI.  [place]: hx HLD, sickle cell trait, iron def anemia, anx/dep, HTN, GERD, prediabetes  PHYSICAL EXAM: BP 138/74 mmHg  Pulse 87  Temp(Src) 98.1 F (36.7 C) (Oral)  Ht 5\' 2"  (1.575 m)  Wt 127 lb 12.8 oz (57.97 kg)  BMI 23.37 kg/m2 Gen: NAD HEENT: NCAT, MMM Heart: RRR no murmrus Lungs: CTAB NWOB Neuro: grossly nonfocal speech normal Ext: No appreciable lower extremity edema bilaterally  Abd: soft NTTP no masses or organomegaly  ASSESSMENT/PLAN:  GASTROESOPHAGEAL REFLUX, NO ESOPHAGITIS Much improved w/ ranitidine and PPI. Continue current regimen.   Weight loss Weight now up. Feeling better. No further workup at this time. F/u in 2 months to recheck weight & ensure still doing well.    FOLLOW UP: F/u in 2 months for GERD & weight check.  [place]. Ardelia Mems, [place]

## 2014-06-25 ENCOUNTER — Ambulatory Visit: Payer: Medicare Other | Admitting: Family Medicine

## 2014-07-07 DIAGNOSIS — R634 Abnormal weight loss: Secondary | ICD-10-CM | POA: Diagnosis not present

## 2014-07-07 DIAGNOSIS — R109 Unspecified abdominal pain: Secondary | ICD-10-CM | POA: Diagnosis not present

## 2014-07-07 DIAGNOSIS — N761 Subacute and chronic vaginitis: Secondary | ICD-10-CM | POA: Diagnosis not present

## 2014-07-09 ENCOUNTER — Ambulatory Visit: Payer: Medicare Other | Admitting: Family Medicine

## 2014-07-10 ENCOUNTER — Ambulatory Visit (INDEPENDENT_AMBULATORY_CARE_PROVIDER_SITE_OTHER): Payer: Medicare Other | Admitting: Family Medicine

## 2014-07-10 ENCOUNTER — Encounter: Payer: Self-pay | Admitting: Family Medicine

## 2014-07-10 VITALS — BP 124/59 | HR 95 | Temp 98.5°F | Wt 118.1 lb

## 2014-07-10 DIAGNOSIS — R1013 Epigastric pain: Secondary | ICD-10-CM

## 2014-07-10 DIAGNOSIS — R682 Dry mouth, unspecified: Secondary | ICD-10-CM

## 2014-07-10 NOTE — Patient Instructions (Addendum)
It was a pleasure seeing you today, Regina Stetzer!  Information regarding what we discussed is included in this packet.  For your dry mouth use Biotene or the generic mouth wash.  Continue to take your Prilosec and ranitidine for acid reflux.  I have placed a referral to see a gastroenterologist for evaluation.  Please make sure to tell your PCP next week if anything changes.  Eat 3 wholesome meals a day.  STOP taking Ibuprofen.  Please feel free to call our office at [phone] if any questions or concerns arise.  Warm Regards, Ashly M. Gottschalk, DO   Gastroesophageal Reflux Disease, Adult Gastroesophageal reflux disease (GERD) happens when acid from your stomach goes into your food pipe (esophagus). The acid can cause a burning feeling in your chest. Over time, the acid can make small holes (ulcers) in your food pipe.  HOME CARE  Ask your doctor for advice about:  Losing weight.  Quitting smoking.  Alcohol use.  Avoid foods and drinks that make your problems worse. You may want to avoid:  Caffeine and alcohol.  Chocolate.  Mints.  Garlic and onions.  Spicy foods.  Citrus fruits, such as oranges, lemons, or limes.  Foods that contain tomato, such as sauce, chili, salsa, and pizza.  Fried and fatty foods.  Avoid lying down for 3 hours before you go to bed or before you take a nap.  Eat small meals often, instead of large meals.  Wear loose-fitting clothing. Do not wear anything tight around your waist.  Raise (elevate) the head of your bed 6 to 8 inches with wood blocks. Using extra pillows does not help.  Only take medicines as told by your doctor.  Do not take aspirin or ibuprofen. GET HELP RIGHT AWAY IF:   You have pain in your arms, neck, jaw, teeth, or back.  Your pain gets worse or changes.  You feel sick to your stomach (nauseous), throw up (vomit), or sweat (diaphoresis).  You feel short of breath, or you pass out (faint).  Your throw up is green,  yellow, black, or looks like coffee grounds or blood.  Your poop (stool) is red, bloody, or black. MAKE SURE YOU:   Understand these instructions.  Will watch your condition.  Will get help right away if you are not doing well or get worse. Document Released: 09/21/2007 Document Revised: 06/27/2011 Document Reviewed: 10/22/2010 [facility] Patient Information 2015 [place], [place]. This information is not intended to replace advice given to you by your health care provider. Make sure you discuss any questions you have with your health care provider.

## 2014-07-10 NOTE — Assessment & Plan Note (Signed)
Patient reports dry mouth -Biotene or generic daily -Drink water and avoid caffeinated  beverages

## 2014-07-10 NOTE — Progress Notes (Signed)
Patient ID: Rhilyn Battle, female   DOB: 09/14/1955, 59 y.o.   MRN: [id number]    Subjective: CC: acid reflux, decreased PO HPI: Patient is a 59 y.o. female presenting to clinic today for same day appt. Concerns today include:  1. Acid reflux Patient reports long standing h/o acid reflux.  She states that is has been affecting her intake over the last month and she is noticing some weight loss.  She also notes that she does not eat a lot because of poor dentition.  She describes an epigastric discomfort with bloating in her lower abdomen.  Denies drinking coffee but does report frequent soda consumption.  Also endorses eating of spicy foods several days per week.  She describes a sour taste in her mouth occasionally and states that it is dry.  She has been using Zantac and Prilosec.  She initially was not taking this as directed because she was concerned that it would be too much medicine.  She reports proper use now and states that it helps minimally.  Denies fevers, chills, vomiting, diarrhea.  No hematochezia or melena.  She saw a GI several years ago and reports h/o peptic ulcer in the past.  2. Abnormal CMET Cr elevated on recent labs.  Patient has scheduled appt with Dr Ardelia Mems next week to discuss these results further.  Patient instructed to STOP taking NSAIDs and hydrate well.  Discussed that this will likely be repeated at next weeks visit.  FamHx and MedHx updated.  Please see EMR.  ROS: All other systems reviewed and are negative.  Objective: Office vital signs reviewed. BP 124/59 mmHg  Pulse 95  Temp(Src) 98.5 F (36.9 C) (Oral)  Wt 118 lb 1.6 oz (53.57 kg)  Physical Examination:  General: Awake, alert,  Well appearing female, NAD HEENT: Normal, EOMI, absence of several teeth, dry MMM Cardio: RRR, S1S2 heard, no murmurs appreciated Pulm: CTAB, no wheezes, rhonchi or rales GI: flat, soft, ND, +sensation of tightness in epigastric region but no overt TTP, no peritoneal  signs, +BS x4 MSK: Normal gait and station  Assessment: 59 y.o. female with acid reflux and elevated Cr  Plan: See Problem List and After Visit Summary   Janora Norlander, DO PGY-1, [place]

## 2014-07-10 NOTE — Assessment & Plan Note (Signed)
Patient with long standing h/o gastroesophageal reflux.  Has seen GI in past and found to have ulcer.  Decreased PO 2/2 early satiety, abdominal discomfort, bloating, and poor dentition. NO hematemesis/hematochezia/melena. -GI referral for possible EGD -Counseled on avoiding acidic foods and NO NSAIDs, see AVS -Continue current regimen with Zantac and Prilosec.  Consider increased Prilosec to 40mg  -Colonoscopy results from 02/2008 reviewed.  -Patient scheduled with PCP next week.  To call sooner if symptoms worsening

## 2014-07-15 ENCOUNTER — Encounter: Payer: Self-pay | Admitting: Internal Medicine

## 2014-07-16 ENCOUNTER — Ambulatory Visit (INDEPENDENT_AMBULATORY_CARE_PROVIDER_SITE_OTHER): Payer: Medicare Other | Admitting: Family Medicine

## 2014-07-16 ENCOUNTER — Ambulatory Visit: Payer: Medicare Other | Admitting: Family Medicine

## 2014-07-16 ENCOUNTER — Encounter: Payer: Self-pay | Admitting: Family Medicine

## 2014-07-16 VITALS — BP 130/89 | HR 99 | Temp 98.7°F | Ht 62.0 in | Wt 115.1 lb

## 2014-07-16 DIAGNOSIS — R634 Abnormal weight loss: Secondary | ICD-10-CM

## 2014-07-16 LAB — COMPREHENSIVE METABOLIC PANEL
ALT: 9 U/L (ref 0–35)
AST: 14 U/L (ref 0–37)
Albumin: 4.5 g/dL (ref 3.5–5.2)
Alkaline Phosphatase: 69 U/L (ref 39–117)
BILIRUBIN TOTAL: 0.4 mg/dL (ref 0.2–1.2)
BUN: 12 mg/dL (ref 6–23)
CHLORIDE: 95 meq/L — AB (ref 96–112)
CO2: 31 mEq/L (ref 19–32)
Calcium: 11.3 mg/dL — ABNORMAL HIGH (ref 8.4–10.5)
Creat: 1.1 mg/dL (ref 0.50–1.10)
GLUCOSE: 117 mg/dL — AB (ref 70–99)
Potassium: 4.6 mEq/L (ref 3.5–5.3)
Sodium: 138 mEq/L (ref 135–145)
Total Protein: 8.3 g/dL (ref 6.0–8.3)

## 2014-07-16 MED ORDER — OMEPRAZOLE 40 MG PO CPDR: 40.0000 mg | DELAYED_RELEASE_CAPSULE | Freq: Every day | ORAL | Status: DC

## 2014-07-16 NOTE — Assessment & Plan Note (Signed)
Continued persistent unintentional weight loss. GI referral has been placed, but appointment is scheduled for late May which is too long to wait.  -We have called and gotten her GI appointment moved up to about 2 weeks from now. -In the meantime I will increase her omeprazole to 40 mg daily.  -Repeat CMET today to reevaluate creatinine and calcium as these were both mildly elevated when checked recently by her OB/GYN. -If renal function has come down to normal or near normal, I will call her and set up a CT scan of her abdomen. -She'll follow-up with me in 1-2 weeks, at that time consider adding Bentyl to her regimen.

## 2014-07-16 NOTE — Progress Notes (Signed)
Patient ID: Regina Sawyer, female   DOB: [birth year], 59 y.o.   MRN: [id number]  HPI:  Follow-up of weight loss: Down about 20 pounds since January. Previously seen her for this, and then on follow-up her weight was normal and her abdominal pain had improved. It is now worsened again. She reports a lot of gassy feeling and burning feeling in her abdomen. She has a bowel movement right after eating. No blood in her stool, night sweats, or fevers. She has been nauseated but has not vomited. She thinks the ranitidine is making her symptoms worse, and would like to stop this. She continues to take omeprazole 20 mg daily. She was referred to gastroenterology and has an appointment in late May. She is concerned this is too long to wait. She's been taking a supplement that she ordered on TV called CB-1 which is intended to help her gain weight. Advised that I cannot recommend for against this as this is an unregulated supplement. She is not eating as much is normal due to the discomfort she feels after eating.  She had lab work done on March 22 at her OB/GYN's office. She had a normal TSH, normal lipase. CMET was significant for chloride of 95, glucose 107, creatinine 1.3, calcium 10.8. Albumin normal at 4.7. CBC unremarkable except for platelets of 437. urinalysis was clear.  ROS: See HPI.  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 Quiet bowel sounds. Ext: atraumatic  ASSESSMENT/PLAN:  Weight loss Continued persistent unintentional weight loss. GI referral has been placed, but appointment is  scheduled for late May which is too long to wait.  -We have called and gotten her GI appointment moved up to about 2 weeks from now. -In the meantime I will increase her omeprazole to 40 mg daily.  -Repeat CMET today to reevaluate creatinine and calcium as these were both mildly elevated when checked recently by her OB/GYN. -If renal function has come down to normal or near normal, I will call her and set up a CT scan of her abdomen. -She'll follow-up with me in 1-2 weeks, at that time consider adding Bentyl to her regimen.    FOLLOW UP: F/u in ~2 weeks for abdominal pain and weight loss Also has GI appt on 4/14.  [place]. Ardelia Mems, [place]

## 2014-07-16 NOTE — Patient Instructions (Signed)
Increase omeprazole strength to 40 mg daily Repeating bloodwork today. We will call you or send you a letter. If your kidney function is okay, will get a CT scan of your abdomen. We'll work on getting you in sooner with the GI doctor. Follow up with me in 3 weeks to see how you're doing.  Be well, Dr. Ardelia Mems

## 2014-07-17 ENCOUNTER — Emergency Department (HOSPITAL_COMMUNITY)
Admission: EM | Admit: 2014-07-17 | Discharge: 2014-07-17 | Discharge status: Absent without leave | Payer: Medicare Other

## 2014-07-17 ENCOUNTER — Telehealth: Payer: Self-pay | Admitting: Family Medicine

## 2014-07-17 NOTE — Telephone Encounter (Signed)
Called patient to discuss elevated calcium level. She will return tomorrow for a lab draw to get a PTH tested. I will repeat her calcium level at that time.  I also instructed her to stop taking her hydrochlorothiazide and her calcium supplement, as these could be contributing to her hypercalcemia. She has an appointment with me in several weeks.  Since elevated calcium may be explaining her abdominal pain, I will hold off and ordering a CT scan of her abdomen at this time. Need to first determine the etiology of her hypercalcemia before deciding on what imaging is necessary. Precepted with Dr. Andria Frames.  Leeanne Rio, MD

## 2014-07-18 ENCOUNTER — Other Ambulatory Visit: Payer: Medicare Other

## 2014-07-18 NOTE — Progress Notes (Signed)
[facility] phlebotomist drew:  PTH Intact and Calcium

## 2014-07-21 ENCOUNTER — Telehealth: Payer: Self-pay | Admitting: Family Medicine

## 2014-07-21 LAB — PTH, INTACT AND CALCIUM
CALCIUM: 10.5 mg/dL (ref 8.4–10.5)
PTH: 60 pg/mL (ref 14–64)

## 2014-07-21 NOTE — Telephone Encounter (Signed)
Pt would like test results from last visit

## 2014-07-22 NOTE — Telephone Encounter (Signed)
Pt is returning the doctors call and will like a call back. jw

## 2014-07-22 NOTE — Telephone Encounter (Signed)
Returned call to patient. Explained we will need to recheck calcium level in 1 month but that for now it is normal. She reports she is still losing weight and continues to have gassy feeling. She has an appointment with GI doctor next week. Stressed to her the importance of this appointment for figuring out why she is losing weight.  Leeanne Rio, MD

## 2014-07-22 NOTE — Telephone Encounter (Signed)
Attempted to return call to patient to discuss results. No answer. Did not leave voicemail. Will try to call again later.  If patient calls back, okay to tell her that her calcium is now normal. I would still like to speak with her about what to do moving forward.  Leeanne Rio, MD

## 2014-07-23 ENCOUNTER — Emergency Department (HOSPITAL_COMMUNITY)
Admission: EM | Admit: 2014-07-23 | Discharge: 2014-07-23 | Disposition: A | Discharge status: Home / Self Care | Payer: Medicare Other | Attending: Emergency Medicine | Admitting: Emergency Medicine

## 2014-07-23 ENCOUNTER — Emergency Department (HOSPITAL_COMMUNITY): Payer: Medicare Other

## 2014-07-23 ENCOUNTER — Encounter (HOSPITAL_COMMUNITY): Payer: Self-pay | Admitting: Emergency Medicine

## 2014-07-23 DIAGNOSIS — Z91040 Latex allergy status: Secondary | ICD-10-CM | POA: Insufficient documentation

## 2014-07-23 DIAGNOSIS — Z862 Personal history of diseases of the blood and blood-forming organs and certain disorders involving the immune mechanism: Secondary | ICD-10-CM | POA: Insufficient documentation

## 2014-07-23 DIAGNOSIS — Z87440 Personal history of urinary (tract) infections: Secondary | ICD-10-CM | POA: Diagnosis not present

## 2014-07-23 DIAGNOSIS — I10 Essential (primary) hypertension: Secondary | ICD-10-CM | POA: Diagnosis not present

## 2014-07-23 DIAGNOSIS — R011 Cardiac murmur, unspecified: Secondary | ICD-10-CM | POA: Insufficient documentation

## 2014-07-23 DIAGNOSIS — R109 Unspecified abdominal pain: Secondary | ICD-10-CM

## 2014-07-23 DIAGNOSIS — Z8742 Personal history of other diseases of the female genital tract: Secondary | ICD-10-CM | POA: Insufficient documentation

## 2014-07-23 DIAGNOSIS — R197 Diarrhea, unspecified: Secondary | ICD-10-CM | POA: Insufficient documentation

## 2014-07-23 DIAGNOSIS — D259 Leiomyoma of uterus, unspecified: Secondary | ICD-10-CM | POA: Diagnosis not present

## 2014-07-23 DIAGNOSIS — Z8659 Personal history of other mental and behavioral disorders: Secondary | ICD-10-CM | POA: Diagnosis not present

## 2014-07-23 DIAGNOSIS — Z8619 Personal history of other infectious and parasitic diseases: Secondary | ICD-10-CM | POA: Insufficient documentation

## 2014-07-23 DIAGNOSIS — Z79899 Other long term (current) drug therapy: Secondary | ICD-10-CM | POA: Diagnosis not present

## 2014-07-23 DIAGNOSIS — K219 Gastro-esophageal reflux disease without esophagitis: Secondary | ICD-10-CM | POA: Insufficient documentation

## 2014-07-23 DIAGNOSIS — R634 Abnormal weight loss: Secondary | ICD-10-CM | POA: Diagnosis not present

## 2014-07-23 LAB — URINALYSIS, ROUTINE W REFLEX MICROSCOPIC
Bilirubin Urine: NEGATIVE
Glucose, UA: NEGATIVE mg/dL
HGB URINE DIPSTICK: NEGATIVE
Ketones, ur: NEGATIVE mg/dL
NITRITE: NEGATIVE
PH: 5 (ref 5.0–8.0)
PROTEIN: NEGATIVE mg/dL
SPECIFIC GRAVITY, URINE: 1.021 (ref 1.005–1.030)
UROBILINOGEN UA: 0.2 mg/dL (ref 0.0–1.0)

## 2014-07-23 LAB — COMPREHENSIVE METABOLIC PANEL
ALBUMIN: 4.3 g/dL (ref 3.5–5.2)
ALT: 12 U/L (ref 0–35)
AST: 17 U/L (ref 0–37)
Alkaline Phosphatase: 60 U/L (ref 39–117)
Anion gap: 9 (ref 5–15)
BUN: 9 mg/dL (ref 6–23)
CALCIUM: 9.9 mg/dL (ref 8.4–10.5)
CO2: 28 mmol/L (ref 19–32)
Chloride: 104 mmol/L (ref 96–112)
Creatinine, Ser: 1.07 mg/dL (ref 0.50–1.10)
GFR calc Af Amer: 66 mL/min — ABNORMAL LOW (ref >90)
GFR, EST NON AFRICAN AMERICAN: 57 mL/min — AB (ref >90)
Glucose, Bld: 127 mg/dL — ABNORMAL HIGH (ref 70–99)
Potassium: 4.3 mmol/L (ref 3.5–5.1)
Sodium: 141 mmol/L (ref 135–145)
Total Bilirubin: 0.5 mg/dL (ref 0.3–1.2)
Total Protein: 8 g/dL (ref 6.0–8.3)

## 2014-07-23 LAB — URINE MICROSCOPIC-ADD ON

## 2014-07-23 LAB — CBC WITH DIFFERENTIAL/PLATELET
BASOS ABS: 0 10*3/uL (ref 0.0–0.1)
BASOS PCT: 1 % (ref 0–1)
EOS PCT: 0 % (ref 0–5)
Eosinophils Absolute: 0 10*3/uL (ref 0.0–0.7)
HEMATOCRIT: 37.4 % (ref 36.0–46.0)
HEMOGLOBIN: 11.8 g/dL — AB (ref 12.0–15.0)
Lymphocytes Relative: 28 % (ref 12–46)
Lymphs Abs: 1.1 10*3/uL (ref 0.7–4.0)
MCH: 26.9 pg (ref 26.0–34.0)
MCHC: 31.6 g/dL (ref 30.0–36.0)
MCV: 85.4 fL (ref 78.0–100.0)
Monocytes Absolute: 0.3 10*3/uL (ref 0.1–1.0)
Monocytes Relative: 8 % (ref 3–12)
Neutro Abs: 2.5 10*3/uL (ref 1.7–7.7)
Neutrophils Relative %: 63 % (ref 43–77)
PLATELETS: 380 10*3/uL (ref 150–400)
RBC: 4.38 MIL/uL (ref 3.87–5.11)
RDW: 13.7 % (ref 11.5–15.5)
WBC: 4 10*3/uL (ref 4.0–10.5)

## 2014-07-23 LAB — LIPASE, BLOOD: Lipase: 28 U/L (ref 11–59)

## 2014-07-23 MED ORDER — IOHEXOL 300 MG/ML  SOLN
100.0000 mL | Freq: Once | INTRAMUSCULAR | Status: AC | PRN
  Administered 2014-07-23: 100 mL via INTRAVENOUS

## 2014-07-23 MED ORDER — IOHEXOL 300 MG/ML  SOLN
50.0000 mL | Freq: Once | INTRAMUSCULAR | Status: AC | PRN
  Administered 2014-07-23: 50 mL via ORAL

## 2014-07-23 NOTE — ED Provider Notes (Signed)
CSN: [id number]     Arrival date & time 07/23/14  0745 History   First MD Initiated Contact with Patient 07/23/14 0750     Chief Complaint  Patient presents with  . GI Problem     (Consider location/radiation/quality/duration/timing/severity/associated sxs/prior Treatment) HPI Comments: Patient presents to the emergency department with chief complaint of abdominal pain, nausea, vomiting, diarrhea, and profound weight loss. She has been seen by her primary care doctor for the same. She is scheduled to see gastroenterology next week. He states that she has noticed an increased "knot" in her abdomen. She states that the pain is moderate. She states the pain keeps her from eating. She reports losing approximately 30 pounds in the past month and a half or so. She denies any associated fevers or chills. She has tried taking omeprazole and ranitidine with no relief.  The history is provided by the patient. No language interpreter was used.    Past Medical History  Diagnosis Date  . Hypertension   . GERD (gastroesophageal reflux disease)   . Heart murmur   . Ulcer   . Depression   . History of bacterial infection   . Sickle cell trait   . Tenderness of female pelvic organs 2005  . H/O vaginitis 2005  . Fibroid 2005  . BV (bacterial vaginosis) 2005  . Candida vaginitis 2006  . Libido, decreased 2006  . Female pelvic pain 2006  . Dysuria 2007  . Irregular periods/menstrual cycles 2007  . Menopausal symptoms 2007  . Hx: UTI (urinary tract infection) 2007  . H/O dyspareunia 2008  . Proteinuria 2009  . Nocturia 2009  . Candida vaginitis 2009  . Atrophic vaginitis 2009  . H/O constipation 2009  . History of hemorrhoids 2009  . Vaginal irritation 2011  . H/O urinary frequency 2011   Past Surgical History  Procedure Laterality Date  . Carpal tunnel release      both wrists  . Tubal ligation     Family History  Problem Relation Age of Onset  . Diabetes Sister   . Hypertension  Sister   . Diabetes Other    History  Substance Use Topics  . Smoking status: Passive Smoke Exposure - Never Smoker  . Smokeless tobacco: Never Used  . Alcohol Use: No   OB History    Gravida Para Term Preterm AB TAB SAB Ectopic Multiple Living   2 2 2  0 0 0 0 0 0 2     Review of Systems  Constitutional: Negative for fever and chills.  Respiratory: Negative for shortness of breath.   Cardiovascular: Negative for chest pain.  Gastrointestinal: Positive for nausea, vomiting, abdominal pain and diarrhea. Negative for constipation.  Genitourinary: Negative for dysuria.  All other systems reviewed and are negative.     Allergies  Atorvastatin and Latex  Home Medications   Prior to Admission medications   Medication Sig Start Date End Date Taking? Authorizing Provider  Calcium Carbonate-Vitamin D 600-400 MG-UNIT per tablet TAKE 1 TABLET BY MOUTH 2 (TWO) TIMES DAILY. 05/12/14   Leeanne Rio, MD  hydrochlorothiazide (HYDRODIURIL) 25 MG tablet TAKE 1 TABLET BY MOUTH EVERY DAY 01/23/14   Leeanne Rio, MD  omeprazole (PRILOSEC) 40 MG capsule Take 1 capsule (40 mg total) by mouth daily. 07/16/14   Leeanne Rio, MD  potassium chloride SA (K-DUR,KLOR-CON) 20 MEQ tablet Take 2 tablets (40 mEq total) by mouth daily. 01/23/14   Leeanne Rio, MD  ranitidine (ZANTAC) 150 MG  tablet Take 1 tablet (150 mg total) by mouth 2 (two) times daily. 06/04/14   Leeanne Rio, MD   BP 141/67 mmHg  Pulse 83  Temp(Src) 99.1 F (37.3 C) (Oral)  Resp 20  SpO2 100% Physical Exam  Constitutional: She is oriented to person, place, and time. She appears well-developed and well-nourished.  HENT:  Head: Normocephalic and atraumatic.  Eyes: Conjunctivae and EOM are normal. Pupils are equal, round, and reactive to light.  Neck: Normal range of motion. Neck supple.  Cardiovascular: Normal rate and regular rhythm.  Exam reveals no gallop and no friction rub.   No murmur  heard. Pulmonary/Chest: Effort normal and breath sounds normal. No respiratory distress. She has no wheezes. She has no rales. She exhibits no tenderness.  Abdominal: Soft. Bowel sounds are normal. She exhibits no distension and no mass. There is no tenderness. There is no rebound and no guarding.  Mild diffuse abdominal tenderness  Musculoskeletal: Normal range of motion. She exhibits no edema or tenderness.  Neurological: She is alert and oriented to person, place, and time.  Skin: Skin is warm and dry.  Psychiatric: She has a normal mood and affect. Her behavior is normal. Judgment and thought content normal.  Nursing note and vitals reviewed.   ED Course  Procedures (including critical care time) Results for orders placed or performed during the hospital encounter of 07/23/14  CBC with Differential/Platelet  Result Value Ref Range   WBC 4.0 4.0 - 10.5 K/uL   RBC 4.38 3.87 - 5.11 MIL/uL   Hemoglobin 11.8 (L) 12.0 - 15.0 g/dL   HCT 37.4 36.0 - 46.0 %   MCV 85.4 78.0 - 100.0 fL   MCH 26.9 26.0 - 34.0 pg   MCHC 31.6 30.0 - 36.0 g/dL   RDW 13.7 11.5 - 15.5 %   Platelets 380 150 - 400 K/uL   Neutrophils Relative % 63 43 - 77 %   Neutro Abs 2.5 1.7 - 7.7 K/uL   Lymphocytes Relative 28 12 - 46 %   Lymphs Abs 1.1 0.7 - 4.0 K/uL   Monocytes Relative 8 3 - 12 %   Monocytes Absolute 0.3 0.1 - 1.0 K/uL   Eosinophils Relative 0 0 - 5 %   Eosinophils Absolute 0.0 0.0 - 0.7 K/uL   Basophils Relative 1 0 - 1 %   Basophils Absolute 0.0 0.0 - 0.1 K/uL  Comprehensive metabolic panel  Result Value Ref Range   Sodium 141 135 - 145 mmol/L   Potassium 4.3 3.5 - 5.1 mmol/L   Chloride 104 96 - 112 mmol/L   CO2 28 19 - 32 mmol/L   Glucose, Bld 127 (H) 70 - 99 mg/dL   BUN 9 6 - 23 mg/dL   Creatinine, Ser 1.07 0.50 - 1.10 mg/dL   Calcium 9.9 8.4 - 10.5 mg/dL   Total Protein 8.0 6.0 - 8.3 g/dL   Albumin 4.3 3.5 - 5.2 g/dL   AST 17 0 - 37 U/L   ALT 12 0 - 35 U/L   Alkaline Phosphatase 60 39 -  117 U/L   Total Bilirubin 0.5 0.3 - 1.2 mg/dL   GFR calc non Af Amer 57 (L) >90 mL/min   GFR calc Af Amer 66 (L) >90 mL/min   Anion gap 9 5 - 15  Lipase, blood  Result Value Ref Range   Lipase 28 11 - 59 U/L  Urinalysis, Routine w reflex microscopic  Result Value Ref Range   Color, Urine  YELLOW YELLOW   APPearance CLOUDY (A) CLEAR   Specific Gravity, Urine 1.021 1.005 - 1.030   pH 5.0 5.0 - 8.0   Glucose, UA NEGATIVE NEGATIVE mg/dL   Hgb urine dipstick NEGATIVE NEGATIVE   Bilirubin Urine NEGATIVE NEGATIVE   Ketones, ur NEGATIVE NEGATIVE mg/dL   Protein, ur NEGATIVE NEGATIVE mg/dL   Urobilinogen, UA 0.2 0.0 - 1.0 mg/dL   Nitrite NEGATIVE NEGATIVE   Leukocytes, UA SMALL (A) NEGATIVE  Urine microscopic-add on  Result Value Ref Range   Squamous Epithelial / LPF MANY (A) RARE   WBC, UA 3-6 <3 WBC/hpf   RBC / HPF 0-2 <3 RBC/hpf   Bacteria, UA FEW (A) RARE   Crystals CA OXALATE CRYSTALS (A) NEGATIVE   Urine-Other MUCOUS PRESENT    Ct Abdomen Pelvis W Contrast  07/23/2014   CLINICAL DATA:  Generalized abdominal pain  EXAM: CT ABDOMEN AND PELVIS WITH CONTRAST  TECHNIQUE: Multidetector CT imaging of the abdomen and pelvis was performed using the standard protocol following bolus administration of intravenous contrast. Oral contrast was also administered.  CONTRAST:  75mL OMNIPAQUE IOHEXOL 300 MG/ML SOLN, 154mL OMNIPAQUE IOHEXOL 300 MG/ML SOLN  COMPARISON:  None.  FINDINGS: Lung bases are clear.  No focal liver lesions are identified. There is no appreciable gallbladder wall thickening. Gallbladder is somewhat more inferiorly located than is generally seen. There is no appreciable biliary duct dilatation.  Spleen, pancreas, and adrenals appear normal. Kidneys bilaterally show no mass or hydronephrosis on either side. There is no renal or ureteral calculus on either side.  In the pelvis, the urinary bladder is midline with normal wall thickness. There is a focal leiomyoma along the anterior  leftward aspect of the lower uterine segment measuring 2.2 x 1.8 cm. Uterus has a somewhat inhomogeneous appearance consistent with leiomyomatous change. Apart from the uterus, there is no pelvic mass. There is no free pelvic fluid. The appendix appears unremarkable.  There is no bowel obstruction. No free air or portal venous air. There is no ascites, adenopathy, or abscess in the abdomen or pelvis. There is no demonstrable abdominal aortic aneurysm. There are no blastic or lytic bone lesions.  IMPRESSION: Leiomyomatous uterus.  No bowel obstruction. No abscess. Appendix region appears normal. No renal or ureteral calculus. No hydronephrosis.   Electronically Signed   By: Lowella Grip III M.D.   On: 07/23/2014 11:47      EKG Interpretation None      MDM   Final diagnoses:  Weight loss  Uterine leiomyoma, unspecified location    Patient with marked weight loss, n/v/d, and mild-moderate abdominal pain.  Has follow-up with GI in a week.  Primary Care note reviewed, recommended that the patient have a CT.  I think that it would benefit the patient to have this done today.  Will check labs and reassess.  Laboratory workup is unremarkable, CT scan remarkable for uterine fibroids, but no other acute process. Recommend follow-up with primary care. Patient will need to have her endoscopy in 1 week as planned. Patient informed she needs to notify her primary care that she had the CT scan done today. Patient understands and agrees with the plan. She is stable and ready for discharge.    Montine Circle, PA-C 07/23/14 [place], MD 07/23/14 [phone]

## 2014-07-23 NOTE — Discharge Instructions (Signed)
YOUR CT SCAN did no show any emergent findings.  You have some fibroids on your uterus.  Please follow-up with your OBGYN or primary care doctor.   Fibroids Fibroids are lumps (tumors) that can occur any place in a woman's body. These lumps are not cancerous. Fibroids vary in size, weight, and where they grow. HOME CARE  Do not take aspirin.  Write down the number of pads or tampons you use during your period. Tell your doctor. This can help determine the best treatment for you. GET HELP RIGHT AWAY IF:  You have pain in your lower belly (abdomen) that is not helped with medicine.  You have cramps that are not helped with medicine.  You have more bleeding between or during your period.  You feel lightheaded or pass out (faint).  Your lower belly pain gets worse. MAKE SURE YOU:  Understand these instructions.  Will watch your condition.  Will get help right away if you are not doing well or get worse. Document Released: 05/07/2010 Document Revised: 06/27/2011 Document Reviewed: 05/07/2010 [facility] Patient Information 2015 [place], [place]. This information is not intended to replace advice given to you by your health care provider. Make sure you discuss any questions you have with your health care provider. Abdominal Pain Many things can cause abdominal pain. Usually, abdominal pain is not caused by a disease and will improve without treatment. It can often be observed and treated at home. Your health care provider will do a physical exam and possibly order blood tests and X-rays to help determine the seriousness of your pain. However, in many cases, more time must pass before a clear cause of the pain can be found. Before that point, your health care provider may not know if you need more testing or further treatment. HOME CARE INSTRUCTIONS  Monitor your abdominal pain for any changes. The following actions may help to alleviate any discomfort you are experiencing:  Only take  over-the-counter or prescription medicines as directed by your health care provider.  Do not take laxatives unless directed to do so by your health care provider.  Try a clear liquid diet (broth, tea, or water) as directed by your health care provider. Slowly move to a bland diet as tolerated. SEEK MEDICAL CARE IF:  You have unexplained abdominal pain.  You have abdominal pain associated with nausea or diarrhea.  You have pain when you urinate or have a bowel movement.  You experience abdominal pain that wakes you in the night.  You have abdominal pain that is worsened or improved by eating food.  You have abdominal pain that is worsened with eating fatty foods.  You have a fever. SEEK IMMEDIATE MEDICAL CARE IF:   Your pain does not go away within 2 hours.  You keep throwing up (vomiting).  Your pain is felt only in portions of the abdomen, such as the right side or the left lower portion of the abdomen.  You pass bloody or black tarry stools. MAKE SURE YOU:  Understand these instructions.   Will watch your condition.   Will get help right away if you are not doing well or get worse.  Document Released: 01/12/2005 Document Revised: 04/09/2013 Document Reviewed: 12/12/2012 [facility] Patient Information 2015 [place], [place]. This information is not intended to replace advice given to you by your health care provider. Make sure you discuss any questions you have with your health care provider.

## 2014-07-23 NOTE — ED Notes (Signed)
Pt. is post menopausal and does not need a pregnancy test.

## 2014-07-23 NOTE — ED Notes (Signed)
Pt has been seen multiple times for same symptoms of diarrhea, gas in stomach, losing weight, and not eating. Pt has appt with GI but states she cannot wait.

## 2014-07-31 ENCOUNTER — Ambulatory Visit (INDEPENDENT_AMBULATORY_CARE_PROVIDER_SITE_OTHER): Payer: Medicare Other | Admitting: Gastroenterology

## 2014-07-31 ENCOUNTER — Encounter: Payer: Self-pay | Admitting: Gastroenterology

## 2014-07-31 VITALS — BP 140/78 | HR 84 | Ht 62.0 in | Wt 117.1 lb

## 2014-07-31 DIAGNOSIS — R109 Unspecified abdominal pain: Secondary | ICD-10-CM | POA: Diagnosis not present

## 2014-07-31 DIAGNOSIS — R634 Abnormal weight loss: Secondary | ICD-10-CM | POA: Diagnosis not present

## 2014-07-31 NOTE — Assessment & Plan Note (Signed)
Three-month history of postprandial abdominal discomfort and 20 pound weight loss, workup including lab work and CT scan unremarkable.  There may be a history of peptic ulcer disease.  Symptoms could be due to ulcer or nonulcer dyspepsia.  There is no obvious neoplasm or other intra-abdominal abnormality by CT scan.  Doubt biliary tract disease.  Recommendations #1 upper endoscopy #2 to consider colonoscopy pending results of #1

## 2014-07-31 NOTE — Patient Instructions (Signed)
You have been scheduled for your Endoscopy procedure at [facility] on 08/06/2014 at 2:35pm Separate instructions have been given

## 2014-07-31 NOTE — Progress Notes (Signed)
_                                                                                                                History of Present Illness:  Regina Sawyer is a 59 year old Afro-American female referred at the request of Dr. Ardelia Mems for evaluation of abdominal pain and weight loss.  For the past 3-4 months she's been complaining of early satiety and postprandial bloating with excess belching and flatus.  She's lost approximately 20 pounds.  She denies change in bowel habits, melena or hematochezia.  She is on no gastric irritants including nonsteroidals.  She had been taking omeprazole.  She reports a history of ulcers but she is very vague on specifics.  CT scan approximate one week ago was unremarkable except for uterine fibroids.  Work including CBC, lipase and comprehensive metabolic profile were also unremarkable.  Colonoscopy in 2009 was unremarkable.   Past Medical History  Diagnosis Date  . Hypertension   . GERD (gastroesophageal reflux disease)   . Heart murmur   . Ulcer   . Depression   . History of bacterial infection   . Sickle cell trait   . Tenderness of female pelvic organs 2005  . H/O vaginitis 2005  . Fibroid 2005  . BV (bacterial vaginosis) 2005  . Candida vaginitis 2006  . Libido, decreased 2006  . Female pelvic pain 2006  . Dysuria 2007  . Irregular periods/menstrual cycles 2007  . Menopausal symptoms 2007  . Hx: UTI (urinary tract infection) 2007  . H/O dyspareunia 2008  . Proteinuria 2009  . Nocturia 2009  . Candida vaginitis 2009  . Atrophic vaginitis 2009  . H/O constipation 2009  . History of hemorrhoids 2009  . Vaginal irritation 2011  . H/O urinary frequency 2011  . Asthma   . Hyperlipidemia    Past Surgical History  Procedure Laterality Date  . Carpal tunnel release      both wrists  . Tubal ligation     family history includes Colon cancer in her father; Diabetes in her other and sister; Gallstones in her mother; Heart failure in  her brother; Hypertension in her sister. There is no history of Colon polyps, Kidney disease, Esophageal cancer, or Gallbladder disease. Current Outpatient Prescriptions  Medication Sig Dispense Refill  . Calcium Carbonate-Vitamin D 600-400 MG-UNIT per tablet TAKE 1 TABLET BY MOUTH 2 (TWO) TIMES DAILY. 60 tablet 1  . hydrochlorothiazide (HYDRODIURIL) 25 MG tablet TAKE 1 TABLET BY MOUTH EVERY DAY (Patient taking differently: Take 25 mg by mouth daily. ) 90 tablet 1  . omeprazole (PRILOSEC) 40 MG capsule Take 1 capsule (40 mg total) by mouth daily. 90 capsule 1  . OVER THE COUNTER MEDICATION Take 1 tablet by mouth daily. Vitamin D + Zinc OTC weight loss supplement    . potassium chloride SA (K-DUR,KLOR-CON) 20 MEQ tablet Take 2 tablets (40 mEq total) by mouth daily. 180 tablet 1  . ranitidine (ZANTAC) 150 MG tablet Take 1 tablet (150 mg total) by  mouth 2 (two) times daily. 180 tablet 1   No current facility-administered medications for this visit.   Allergies as of 07/31/2014 - Review Complete 07/31/2014  Allergen Reaction Noted  . Atorvastatin Other (See Comments) 05/19/2014  . Latex Hives and Itching 02/04/2009    reports that she has been passively smoking.  She has never used smokeless tobacco. She reports that she does not drink alcohol or use illicit drugs.   Review of Systems: Pertinent positive and negative review of systems were noted in the above HPI section. All other review of systems were otherwise negative.  Vital signs were reviewed in today's medical record Physical Exam: General: Well developed , well nourished, no acute distress Skin: anicteric Head: Normocephalic and atraumatic Eyes:  sclerae anicteric, EOMI Ears: Normal auditory acuity Mouth: No deformity or lesions Neck: Supple, no masses or thyromegaly Lungs: Clear throughout to auscultation Heart: Regular rate and rhythm; no rubs or bruits.  There is a 1/6 early systolic murmur Abdomen: Soft,  and non distended.  No masses, hepatosplenomegaly or hernias noted. Normal Bowel sounds.  There is very mild tenderness in the periumbilical area to deep palpation Rectal:deferred Musculoskeletal: Symmetrical with no gross deformities  Skin: No lesions on visible extremities Pulses:  Normal pulses noted Extremities: No clubbing, cyanosis, edema or deformities noted Neurological: Alert oriented x 4, grossly nonfocal Cervical Nodes:  No significant cervical adenopathy Inguinal Nodes: No significant inguinal adenopathy Psychological:  Alert and cooperative. Normal mood and affect  See Assessment and Plan under Problem List

## 2014-08-01 ENCOUNTER — Ambulatory Visit (INDEPENDENT_AMBULATORY_CARE_PROVIDER_SITE_OTHER): Payer: Medicare Other | Admitting: Family Medicine

## 2014-08-01 VITALS — BP 123/74 | HR 81 | Temp 98.7°F | Ht 62.0 in | Wt 117.0 lb

## 2014-08-01 DIAGNOSIS — R634 Abnormal weight loss: Secondary | ICD-10-CM

## 2014-08-01 MED ORDER — HYDROCORTISONE 0.5 % EX CREA: 1.0000 "application " | TOPICAL_CREAM | Freq: Two times a day (BID) | CUTANEOUS | Status: DC

## 2014-08-01 NOTE — Patient Instructions (Signed)
Apply hydrocortisone cream twice a day Follow up with me in 2 weeks Sooner if you are getting worse  If the eye blurriness does not get better or go away please call your eye doctor for an appointment  Be well, Dr. Ardelia Mems

## 2014-08-01 NOTE — Assessment & Plan Note (Signed)
Weight stable since I last saw her. This is reassuring. Has upcoming EGD planned. May get colonoscopy ending on the results of EGD. She is very anxious about this weight loss. I think this is the cause of her blurry vision and itching. I will prescribe hydrocortisone cream for help with itching. Advised that if her vision were to worsen or not improve, she should call her eye doctor for an appointment. She is agreeable to this plan. She will follow-up with me in 2 weeks.

## 2014-08-01 NOTE — Progress Notes (Signed)
Patient ID: Regina Sawyer, female   DOB: Oct 09, 1955, 59 y.o.   MRN: [id number]  HPI:  Follow-up weight loss: Saw the GI doctor yesterday. Planning for EGD in 5 days. Continues to endorse getting full easily. Has lots of gas and burping. Feels nauseated without vomiting. Bowels are moving normally. No blood in her stool. She is very anxious about her weight loss. Endorses that her vision has been blurry in her left eye today, which she thinks is due to being anxious. Also has been itching for about a week. Denies any sores in her mouth her genitals. Has not had a fever. Eyes also itch some. She attributes all of this to stress and allergies.  ROS: See HPI.  [place]: Hypertension, GERD, allergic rhinitis, iron deficiency anemia  PHYSICAL EXAM: BP 123/74 mmHg  Pulse 81  Temp(Src) 98.7 F (37.1 C) (Oral)  Ht 5\' 2"  (1.575 m)  Wt 117 lb (53.071 kg)  BMI 21.39 kg/m2 Gen: No acute distress, pleasant, cooperative HEENT: Normocephalic, atraumatic, oropharynx clear and moist without lesions Heart: Regular rate and rhythm, 2/6 systolic murmur as previously heard Lungs: Good auscultation bilaterally, normal respiratory effort Abdomen: Soft, very mild epigastric tenderness, no masses or organomegaly, no suprapubic tenderness Neuro: Grossly nonfocal, speech normal Eyes: Chronic exotropia present. No perilimbal redness. Vision 20/80 bilaterally. Pupils equal round and reactive to light Ext: No edema bilateral lower extremities Skin: Area of itching on back shows no rash  ASSESSMENT/PLAN:  Weight loss Weight stable since I last saw her. This is reassuring. Has upcoming EGD planned. May get colonoscopy ending on the results of EGD. She is very anxious about this weight loss. I think this is the cause of her blurry vision and itching. I will prescribe hydrocortisone cream for help with itching. Advised that if her vision were to worsen or not improve, she should call her eye doctor for an appointment. She  is agreeable to this plan. She will follow-up with me in 2 weeks.    FOLLOW UP: F/u in 2 weeks for weight loss  [place] J. Ardelia Mems, [place]

## 2014-08-06 ENCOUNTER — Encounter (HOSPITAL_COMMUNITY)
Admission: RE | Disposition: A | Discharge status: Home / Self Care | Payer: Self-pay | Source: Ambulatory Visit | Attending: Gastroenterology

## 2014-08-06 ENCOUNTER — Ambulatory Visit (HOSPITAL_COMMUNITY)
Admission: RE | Admit: 2014-08-06 | Discharge: 2014-08-06 | Disposition: A | Discharge status: Home / Self Care | Payer: Medicare Other | Source: Ambulatory Visit | Attending: Gastroenterology | Admitting: Gastroenterology

## 2014-08-06 ENCOUNTER — Encounter (HOSPITAL_COMMUNITY): Payer: Self-pay

## 2014-08-06 DIAGNOSIS — Z9851 Tubal ligation status: Secondary | ICD-10-CM | POA: Insufficient documentation

## 2014-08-06 DIAGNOSIS — R634 Abnormal weight loss: Secondary | ICD-10-CM | POA: Diagnosis not present

## 2014-08-06 DIAGNOSIS — E785 Hyperlipidemia, unspecified: Secondary | ICD-10-CM | POA: Insufficient documentation

## 2014-08-06 DIAGNOSIS — F329 Major depressive disorder, single episode, unspecified: Secondary | ICD-10-CM | POA: Diagnosis not present

## 2014-08-06 DIAGNOSIS — Z91040 Latex allergy status: Secondary | ICD-10-CM | POA: Insufficient documentation

## 2014-08-06 DIAGNOSIS — Z888 Allergy status to other drugs, medicaments and biological substances status: Secondary | ICD-10-CM | POA: Diagnosis not present

## 2014-08-06 DIAGNOSIS — J45909 Unspecified asthma, uncomplicated: Secondary | ICD-10-CM | POA: Diagnosis not present

## 2014-08-06 DIAGNOSIS — K317 Polyp of stomach and duodenum: Secondary | ICD-10-CM | POA: Diagnosis not present

## 2014-08-06 DIAGNOSIS — I10 Essential (primary) hypertension: Secondary | ICD-10-CM | POA: Diagnosis not present

## 2014-08-06 DIAGNOSIS — K219 Gastro-esophageal reflux disease without esophagitis: Secondary | ICD-10-CM | POA: Insufficient documentation

## 2014-08-06 DIAGNOSIS — R109 Unspecified abdominal pain: Secondary | ICD-10-CM | POA: Diagnosis not present

## 2014-08-06 DIAGNOSIS — R1013 Epigastric pain: Secondary | ICD-10-CM | POA: Diagnosis present

## 2014-08-06 HISTORY — PX: ESOPHAGOGASTRODUODENOSCOPY: SHX5428

## 2014-08-06 SURGERY — EGD (ESOPHAGOGASTRODUODENOSCOPY)
Anesthesia: Moderate Sedation

## 2014-08-06 MED ORDER — FENTANYL CITRATE (PF) 100 MCG/2ML IJ SOLN
INTRAMUSCULAR | Status: AC
  Filled 2014-08-06: qty 2

## 2014-08-06 MED ORDER — BUTAMBEN-TETRACAINE-BENZOCAINE 2-2-14 % EX AERO
INHALATION_SPRAY | CUTANEOUS | Status: DC | PRN
  Administered 2014-08-06: 2 via TOPICAL

## 2014-08-06 MED ORDER — MIDAZOLAM HCL 10 MG/2ML IJ SOLN
INTRAMUSCULAR | Status: DC | PRN
  Administered 2014-08-06 (×2): 2 mg via INTRAVENOUS

## 2014-08-06 MED ORDER — DIPHENHYDRAMINE HCL 50 MG/ML IJ SOLN
INTRAMUSCULAR | Status: AC
  Filled 2014-08-06: qty 1

## 2014-08-06 MED ORDER — FENTANYL CITRATE (PF) 100 MCG/2ML IJ SOLN
INTRAMUSCULAR | Status: DC | PRN
  Administered 2014-08-06 (×2): 25 ug via INTRAVENOUS

## 2014-08-06 MED ORDER — MIDAZOLAM HCL 10 MG/2ML IJ SOLN
INTRAMUSCULAR | Status: AC
  Filled 2014-08-06: qty 2

## 2014-08-06 MED ORDER — SODIUM CHLORIDE 0.9 % IV SOLN
INTRAVENOUS | Status: DC
  Administered 2014-08-06: 500 mL via INTRAVENOUS

## 2014-08-06 NOTE — Discharge Instructions (Signed)
Esophagogastroduodenoscopy °Care After °Refer to this sheet in the next few weeks. These instructions provide you with information on caring for yourself after your procedure. Your caregiver may also give you more specific instructions. Your treatment has been planned according to current medical practices, but problems sometimes occur. Call your caregiver if you have any problems or questions after your procedure.  °HOME CARE INSTRUCTIONS °· Do not eat or drink anything until the numbing medicine (local anesthetic) has worn off and your gag reflex has returned. You will know that the local anesthetic has worn off when you can swallow comfortably. °· Do not drive for 12 hours after the procedure or as directed by your caregiver. °· Only take medicines as directed by your caregiver. °SEEK MEDICAL CARE IF:  °· You cannot stop coughing. °· You are not urinating at all or less than usual. °SEEK IMMEDIATE MEDICAL CARE IF: °· You have difficulty swallowing. °· You cannot eat or drink. °· You have worsening throat or chest pain. °· You have dizziness, lightheadedness, or you faint. °· You have nausea or vomiting. °· You have chills. °· You have a fever. °· You have severe abdominal pain. °· You have black, tarry, or bloody stools. °Document Released: 03/21/2012 Document Reviewed: 03/21/2012 °[employer]® Patient Information ©2015 [employer]. This information is not intended to replace advice given to you by your health care provider. Make sure you discuss any questions you have with your health care provider. ° °

## 2014-08-06 NOTE — Op Note (Signed)
[facility] [place], [id number]   ENDOSCOPY PROCEDURE REPORT  PATIENT: Regina Sawyer, Regina Sawyer  MR#: [id number] BIRTHDATE: 1956/01/05 , 29  yrs. old GENDER: female ENDOSCOPIST: Inda Castle, MD REFERRED BY: PROCEDURE DATE:  08/06/2014 PROCEDURE:  EGD, diagnostic ASA CLASS:     Class II INDICATIONS:  dyspepsia. MEDICATIONS: Versed 4 mg IV and Fentanyl 50 mcg IV TOPICAL ANESTHETIC: Cetacaine Spray  DESCRIPTION OF PROCEDURE: After the risks benefits and alternatives of the procedure were thoroughly explained, informed consent was obtained.  The [place] [id number] endoscope was introduced through the mouth and advanced to the second portion of the duodenum , Without limitations.  The instrument was slowly withdrawn as the mucosa was fully examined.      STOMACH: A few small smooth sessile polyps (fundic gland polyps) were found in the gastric body. The remainder of the exam was normal. Retroflexed views revealed no abnormalities.     The scope was then withdrawn from the patient and the procedure completed.  COMPLICATIONS: There were no immediate complications.  ENDOSCOPIC IMPRESSION: Few small sessile polyps were found in the gastric body (fundic gland polyps)  RECOMMENDATIONS: gastric emptying scan Stool Hemoccults  REPEAT EXAM:  eSigned:  Inda Castle, MD 08/06/2014 3:44 PM    CC: Chrisandra Netters, MD

## 2014-08-06 NOTE — Interval H&P Note (Signed)
History and Physical Interval Note:  08/06/2014 3:16 PM  Regina Sawyer  has presented today for surgery, with the diagnosis of abdominal pain   The various methods of treatment have been discussed with the patient and family. After consideration of risks, benefits and other options for treatment, the patient has consented to  Procedure(s): ESOPHAGOGASTRODUODENOSCOPY (EGD) (N/A) as a surgical intervention .  The patient's history has been reviewed, patient examined, no change in status, stable for surgery.  I have reviewed the patient's chart and labs.  Questions were answered to the patient's satisfaction.    The recent H&P (dated *07/31/14**) was reviewed, the patient was examined and there is no change in the patients condition since that H&P was completed.   Erskine Emery  08/06/2014, 3:17 PM    Erskine Emery

## 2014-08-06 NOTE — H&P (View-Only) (Signed)
_                                                                                                                History of Present Illness:  Regina Sawyer is a 59 year old Afro-American female referred at the request of Dr. Ardelia Mems for evaluation of abdominal pain and weight loss.  For the past 3-4 months she's been complaining of early satiety and postprandial bloating with excess belching and flatus.  She's lost approximately 20 pounds.  She denies change in bowel habits, melena or hematochezia.  She is on no gastric irritants including nonsteroidals.  She had been taking omeprazole.  She reports a history of ulcers but she is very vague on specifics.  CT scan approximate one week ago was unremarkable except for uterine fibroids.  Work including CBC, lipase and comprehensive metabolic profile were also unremarkable.  Colonoscopy in 2009 was unremarkable.   Past Medical History  Diagnosis Date  . Hypertension   . GERD (gastroesophageal reflux disease)   . Heart murmur   . Ulcer   . Depression   . History of bacterial infection   . Sickle cell trait   . Tenderness of female pelvic organs 2005  . H/O vaginitis 2005  . Fibroid 2005  . BV (bacterial vaginosis) 2005  . Candida vaginitis 2006  . Libido, decreased 2006  . Female pelvic pain 2006  . Dysuria 2007  . Irregular periods/menstrual cycles 2007  . Menopausal symptoms 2007  . Hx: UTI (urinary tract infection) 2007  . H/O dyspareunia 2008  . Proteinuria 2009  . Nocturia 2009  . Candida vaginitis 2009  . Atrophic vaginitis 2009  . H/O constipation 2009  . History of hemorrhoids 2009  . Vaginal irritation 2011  . H/O urinary frequency 2011  . Asthma   . Hyperlipidemia    Past Surgical History  Procedure Laterality Date  . Carpal tunnel release      both wrists  . Tubal ligation     family history includes Colon cancer in her father; Diabetes in her other and sister; Gallstones in her mother; Heart failure in  her brother; Hypertension in her sister. There is no history of Colon polyps, Kidney disease, Esophageal cancer, or Gallbladder disease. Current Outpatient Prescriptions  Medication Sig Dispense Refill  . Calcium Carbonate-Vitamin D 600-400 MG-UNIT per tablet TAKE 1 TABLET BY MOUTH 2 (TWO) TIMES DAILY. 60 tablet 1  . hydrochlorothiazide (HYDRODIURIL) 25 MG tablet TAKE 1 TABLET BY MOUTH EVERY DAY (Patient taking differently: Take 25 mg by mouth daily. ) 90 tablet 1  . omeprazole (PRILOSEC) 40 MG capsule Take 1 capsule (40 mg total) by mouth daily. 90 capsule 1  . OVER THE COUNTER MEDICATION Take 1 tablet by mouth daily. Vitamin D + Zinc OTC weight loss supplement    . potassium chloride SA (K-DUR,KLOR-CON) 20 MEQ tablet Take 2 tablets (40 mEq total) by mouth daily. 180 tablet 1  . ranitidine (ZANTAC) 150 MG tablet Take 1 tablet (150 mg total) by  mouth 2 (two) times daily. 180 tablet 1   No current facility-administered medications for this visit.   Allergies as of 07/31/2014 - Review Complete 07/31/2014  Allergen Reaction Noted  . Atorvastatin Other (See Comments) 05/19/2014  . Latex Hives and Itching 02/04/2009    reports that she has been passively smoking.  She has never used smokeless tobacco. She reports that she does not drink alcohol or use illicit drugs.   Review of Systems: Pertinent positive and negative review of systems were noted in the above HPI section. All other review of systems were otherwise negative.  Vital signs were reviewed in today's medical record Physical Exam: General: Well developed , well nourished, no acute distress Skin: anicteric Head: Normocephalic and atraumatic Eyes:  sclerae anicteric, EOMI Ears: Normal auditory acuity Mouth: No deformity or lesions Neck: Supple, no masses or thyromegaly Lungs: Clear throughout to auscultation Heart: Regular rate and rhythm; no rubs or bruits.  There is a 1/6 early systolic murmur Abdomen: Soft,  and non distended.  No masses, hepatosplenomegaly or hernias noted. Normal Bowel sounds.  There is very mild tenderness in the periumbilical area to deep palpation Rectal:deferred Musculoskeletal: Symmetrical with no gross deformities  Skin: No lesions on visible extremities Pulses:  Normal pulses noted Extremities: No clubbing, cyanosis, edema or deformities noted Neurological: Alert oriented x 4, grossly nonfocal Cervical Nodes:  No significant cervical adenopathy Inguinal Nodes: No significant inguinal adenopathy Psychological:  Alert and cooperative. Normal mood and affect  See Assessment and Plan under Problem List

## 2014-08-07 ENCOUNTER — Ambulatory Visit: Payer: Medicare Other | Admitting: Family Medicine

## 2014-08-07 ENCOUNTER — Encounter (HOSPITAL_COMMUNITY): Payer: Self-pay | Admitting: Gastroenterology

## 2014-08-07 ENCOUNTER — Telehealth: Payer: Self-pay | Admitting: Family Medicine

## 2014-08-07 NOTE — Telephone Encounter (Signed)
She would like to know her results from the endoscopy, the patient made it clear that she only wants to speak to Dr. Ardelia Mems / thanks Fonda Kinder, ASA

## 2014-08-11 NOTE — Telephone Encounter (Signed)
Returned call to pt & explained relatively normal EGD results. Answered all questions. Encouraged her to contact GI doctor's office about stool cards and gastric emptying study since she hasn't heard about scheduling that yet.  Leeanne Rio, MD

## 2014-08-15 ENCOUNTER — Ambulatory Visit (INDEPENDENT_AMBULATORY_CARE_PROVIDER_SITE_OTHER): Payer: Medicare Other | Admitting: Family Medicine

## 2014-08-15 ENCOUNTER — Encounter: Payer: Self-pay | Admitting: Family Medicine

## 2014-08-15 DIAGNOSIS — R634 Abnormal weight loss: Secondary | ICD-10-CM | POA: Diagnosis not present

## 2014-08-15 NOTE — Patient Instructions (Signed)
We will test for blood in your stool and check how fast your stomach is emptying. I will schedule that test and you will get a phone call when it is set up.  Please follow-up with Dr. Ardelia Mems in 2 weeks.

## 2014-08-16 NOTE — Assessment & Plan Note (Addendum)
Weight down 20lbs since January but stable over the past month. Recent EGD with gastric polyps only. History somewhat suggestive of gastroparesis with postprandial pain, bloating, belching - GI recs: FOBT + gastric emptying study - ordered today - would consider repeat colonoscopy, normal in 2009 - lots of reassurance given, patient very anxious and thought polyps were stomach cancer - no new meds today, possible trial of reglan pending further work-up

## 2014-08-16 NOTE — Progress Notes (Signed)
   Subjective:    Patient ID: Regina Sawyer, female    DOB: 18-Jan-1956, 59 y.o.   MRN: [id number]  HPI Pt presents for f/u of weight loss. She is very agitated and concerned because results of EGD were not communicated to her effectively and she thinks she has stomach cancer and wants to know more about this. Per PCP note she received these results earlier this week but does not seem to have understood them. On chart review, her EGD showed small polyps but no evidence of malignancy. GI is now recommending gastric emptying study and FOBT, patient states she knows nothing about this and nothing appears to have been ordered in epic so far.    Review of Systems  Constitutional: Positive for unexpected weight change.  Gastrointestinal: Positive for nausea, abdominal pain and abdominal distention. Negative for vomiting, diarrhea, constipation and blood in stool.       Objective:   Physical Exam  Constitutional: She is oriented to person, place, and time. She appears well-developed and well-nourished. No distress.  HENT:  Head: Normocephalic and atraumatic.  Cardiovascular: Normal rate.   Pulmonary/Chest: Effort normal.  Abdominal: Soft. Bowel sounds are normal. She exhibits no distension and no mass. There is no tenderness. There is no rebound and no guarding.  Neurological: She is alert and oriented to person, place, and time.  Skin: Skin is warm and dry. No rash noted. She is not diaphoretic.  Psychiatric:  Agitated at first but calmed significantly by the end of visit  Nursing note and vitals reviewed.         Assessment & Plan:

## 2014-08-18 ENCOUNTER — Telehealth: Payer: Self-pay | Admitting: Family Medicine

## 2014-08-18 ENCOUNTER — Encounter: Payer: Self-pay | Admitting: Family Medicine

## 2014-08-18 NOTE — Telephone Encounter (Signed)
There is a message missing that i didn't send about her wondering when tests are being done. Its under documentation under todays date. Accidentally clicked complete. Deseree Kennon Holter, CMA

## 2014-08-18 NOTE — Telephone Encounter (Signed)
Pt called back. She didn't understand why her appt on May 19 had been cancelled by Dr Ardelia Mems. Per [facility], appt for May 19 was canceled by Ardelia Mems because it was too far out and another was made on April 14. April 14- pt sees Dr Gelene Mink April 20-endoscopy April 25-pt talked to Dr Ardelia Mems April 29 -saw [facility]  Told pt to call [facility] for a followup visit All this explained to pt and she seemed to understand

## 2014-08-18 NOTE — Progress Notes (Unsigned)
Patient is wanting to know when tests are going to be scheduled.  She was seen by specialist last week and they said more tests needed to be done.  Also wants to know about diabetic shoes.

## 2014-08-18 NOTE — Telephone Encounter (Signed)
Pt called and stated that she needed to see her doctor because she is still losing weight. The soonest available appointment for her to see her PCP is on May 27th. Patient was displeased with this and states that she can't wait that long and that we are "trying to kill her". I asked if she would be willing to see another doctor and she denied it. She did not make an appointment, and wants to know if we have called "that digestive place" for her yet. Thanks [employer], ASA

## 2014-08-18 NOTE — Telephone Encounter (Signed)
Attempted to have a cordial conversation with the pt in regards to getting her GI appointment figured out.  I went through the appointment tab and explained to her that she had an appointment scheduled for 5/19 with Dr. Carlean Purl, which had been cancelled, despite her stating she had not cancelled this.  As well, there are two different referrals in to see the GI doctor.  Explained it may take time for them to call her so I gave her the number to call them, for scheduling purposes.  At this point, she became extremely enraged stating "I'm going to sue you doctors" because she had to call them, wondering why we can't call to schedule the appointment for her.  Reiterated to her that we have placed the referral and the appointment is ready for scheduling.  They will call her sometime in the next week or she can call now to expedite this process.  She did not understand how this worked so I recommended she call when she get off the phone with me and if she had any other problems to call back and I will help her with it.  Tamela Oddi Awanda Mink, DO of [facility] 08/18/2014, 9:47 AM

## 2014-08-19 ENCOUNTER — Other Ambulatory Visit: Payer: Self-pay | Admitting: *Deleted

## 2014-08-19 DIAGNOSIS — R634 Abnormal weight loss: Secondary | ICD-10-CM

## 2014-08-19 LAB — POC HEMOCCULT BLD/STL (HOME/3-CARD/SCREEN)

## 2014-08-27 ENCOUNTER — Encounter: Payer: Self-pay | Admitting: Family Medicine

## 2014-08-29 ENCOUNTER — Ambulatory Visit (HOSPITAL_COMMUNITY)
Admission: RE | Admit: 2014-08-29 | Discharge: 2014-08-29 | Disposition: A | Discharge status: Home / Self Care | Payer: Medicare Other | Source: Ambulatory Visit | Attending: Family Medicine | Admitting: Family Medicine

## 2014-08-29 DIAGNOSIS — R109 Unspecified abdominal pain: Secondary | ICD-10-CM | POA: Diagnosis not present

## 2014-08-29 DIAGNOSIS — K219 Gastro-esophageal reflux disease without esophagitis: Secondary | ICD-10-CM | POA: Diagnosis not present

## 2014-08-29 DIAGNOSIS — R634 Abnormal weight loss: Secondary | ICD-10-CM | POA: Insufficient documentation

## 2014-08-29 MED ORDER — TECHNETIUM TC 99M SULFUR COLLOID
2.0000 | Freq: Once | INTRAVENOUS | Status: AC | PRN
  Administered 2014-08-29: 2 via INTRAVENOUS

## 2014-09-01 ENCOUNTER — Telehealth: Payer: Self-pay | Admitting: Family Medicine

## 2014-09-01 NOTE — Telephone Encounter (Signed)
Regina Sawyer is calling about her results from the hospital. She would like a phone call when they are received. Thank you, Fonda Kinder, ASA

## 2014-09-01 NOTE — Telephone Encounter (Signed)
Pt called and would like the results of her test that she took while in the hospital. jw

## 2014-09-01 NOTE — Telephone Encounter (Signed)
Returned call to patient. Explained normal gastric emptying study. She has an appointment with me on May 27. I think she needs to see Dr. Deatra Ina, the gastroenterologist again.  Red team, please call and schedule patient a follow-up appointment with Dr. Deatra Ina. This should ideally be before she sees me on the 27th if that is possible. She reports she continues to lose weight.  Leeanne Rio, MD

## 2014-09-03 ENCOUNTER — Encounter (HOSPITAL_COMMUNITY): Payer: Self-pay

## 2014-09-03 ENCOUNTER — Emergency Department (INDEPENDENT_AMBULATORY_CARE_PROVIDER_SITE_OTHER)
Admission: EM | Admit: 2014-09-03 | Discharge: 2014-09-03 | Disposition: A | Discharge status: Home / Self Care | Payer: Self-pay | Source: Home / Self Care | Attending: Family Medicine | Admitting: Family Medicine

## 2014-09-03 DIAGNOSIS — S161XXA Strain of muscle, fascia and tendon at neck level, initial encounter: Secondary | ICD-10-CM

## 2014-09-03 MED ORDER — IBUPROFEN 600 MG PO TABS: 600.0000 mg | ORAL_TABLET | Freq: Three times a day (TID) | ORAL | Status: DC | PRN

## 2014-09-03 MED ORDER — IBUPROFEN 800 MG PO TABS
ORAL_TABLET | ORAL | Status: AC
  Filled 2014-09-03: qty 1

## 2014-09-03 MED ORDER — IBUPROFEN 800 MG PO TABS
800.0000 mg | ORAL_TABLET | Freq: Once | ORAL | Status: AC
  Administered 2014-09-03: 800 mg via ORAL
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
## 2014-09-04 ENCOUNTER — Ambulatory Visit: Payer: Medicare Other | Admitting: Internal Medicine

## 2014-09-04 ENCOUNTER — Emergency Department (HOSPITAL_COMMUNITY)
Admission: EM | Admit: 2014-09-04 | Discharge: 2014-09-04 | Discharge status: Absent without leave | Payer: Medicare Other

## 2014-09-04 ENCOUNTER — Encounter: Payer: Self-pay | Admitting: Family Medicine

## 2014-09-04 ENCOUNTER — Ambulatory Visit (INDEPENDENT_AMBULATORY_CARE_PROVIDER_SITE_OTHER): Payer: Medicare Other | Admitting: Family Medicine

## 2014-09-04 VITALS — BP 114/80 | HR 78 | Temp 98.3°F | Wt 113.0 lb

## 2014-09-04 DIAGNOSIS — M25512 Pain in left shoulder: Secondary | ICD-10-CM

## 2014-09-04 DIAGNOSIS — M542 Cervicalgia: Secondary | ICD-10-CM | POA: Diagnosis not present

## 2014-09-04 DIAGNOSIS — M25511 Pain in right shoulder: Secondary | ICD-10-CM | POA: Diagnosis not present

## 2014-09-04 MED ORDER — TRAMADOL HCL 50 MG PO TABS: 50.0000 mg | ORAL_TABLET | Freq: Three times a day (TID) | ORAL | Status: DC | PRN

## 2014-09-04 NOTE — Patient Instructions (Signed)
Nice to meet you. You injuries are likely due to strain of muscles in your neck and shoulder. We will obtain x-rays to further evaluate this to ensure no bony issues.  If you develop any numbness, weakness, tingling, worsening pain, or loss of bowel or bladder function please seek medical attention.

## 2014-09-04 NOTE — Telephone Encounter (Signed)
Called Dr. Kelby Fam office, nurse stated patient already has follow up scheduled for this concern for 6/6. FYI to PCP.

## 2014-09-05 ENCOUNTER — Telehealth: Payer: Self-pay | Admitting: Family Medicine

## 2014-09-05 ENCOUNTER — Ambulatory Visit (HOSPITAL_COMMUNITY)
Admission: RE | Admit: 2014-09-05 | Discharge: 2014-09-05 | Disposition: A | Discharge status: Home / Self Care | Payer: No Typology Code available for payment source | Source: Ambulatory Visit | Attending: Family Medicine | Admitting: Family Medicine

## 2014-09-05 DIAGNOSIS — M25511 Pain in right shoulder: Secondary | ICD-10-CM | POA: Diagnosis not present

## 2014-09-05 DIAGNOSIS — M542 Cervicalgia: Secondary | ICD-10-CM | POA: Diagnosis not present

## 2014-09-05 DIAGNOSIS — S4991XA Unspecified injury of right shoulder and upper arm, initial encounter: Secondary | ICD-10-CM | POA: Diagnosis not present

## 2014-09-05 DIAGNOSIS — S4992XA Unspecified injury of left shoulder and upper arm, initial encounter: Secondary | ICD-10-CM | POA: Diagnosis not present

## 2014-09-05 DIAGNOSIS — S199XXA Unspecified injury of neck, initial encounter: Secondary | ICD-10-CM | POA: Diagnosis not present

## 2014-09-05 DIAGNOSIS — M25512 Pain in left shoulder: Secondary | ICD-10-CM | POA: Diagnosis not present
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
## 2014-09-08 ENCOUNTER — Encounter: Payer: Self-pay | Admitting: Family Medicine
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
## 2014-09-09 ENCOUNTER — Other Ambulatory Visit: Payer: Self-pay | Admitting: Family Medicine

## 2014-09-09 DIAGNOSIS — M542 Cervicalgia: Secondary | ICD-10-CM

## 2014-09-09 NOTE — Progress Notes (Signed)
Order entered. Leeanne Rio, MD

## 2014-09-11 ENCOUNTER — Ambulatory Visit: Payer: Medicare Other | Admitting: Physical Therapy

## 2014-09-11 ENCOUNTER — Encounter: Payer: Self-pay | Admitting: Family Medicine

## 2014-09-11 NOTE — Progress Notes (Signed)
Patient cancelled appointment with Outpatient Rehab because referral was just for neck pain and she needs to be seen for back, neck,shoulder,and arm pain.

## 2014-09-12 ENCOUNTER — Encounter: Payer: Self-pay | Admitting: Family Medicine

## 2014-09-12 ENCOUNTER — Other Ambulatory Visit: Payer: Self-pay | Admitting: Family Medicine

## 2014-09-12 ENCOUNTER — Ambulatory Visit (INDEPENDENT_AMBULATORY_CARE_PROVIDER_SITE_OTHER): Payer: Medicare Other | Admitting: Family Medicine

## 2014-09-12 VITALS — BP 144/88 | HR 84 | Temp 98.3°F | Ht 62.0 in | Wt 118.0 lb

## 2014-09-12 DIAGNOSIS — R634 Abnormal weight loss: Secondary | ICD-10-CM | POA: Diagnosis not present

## 2014-09-12 DIAGNOSIS — M542 Cervicalgia: Secondary | ICD-10-CM

## 2014-09-12 DIAGNOSIS — M25519 Pain in unspecified shoulder: Secondary | ICD-10-CM

## 2014-09-12 DIAGNOSIS — M549 Dorsalgia, unspecified: Secondary | ICD-10-CM

## 2014-09-12 DIAGNOSIS — M25529 Pain in unspecified elbow: Secondary | ICD-10-CM

## 2014-09-12 DIAGNOSIS — I10 Essential (primary) hypertension: Secondary | ICD-10-CM | POA: Diagnosis not present

## 2014-09-12 NOTE — Progress Notes (Signed)
Referral updated Regina Rio, MD

## 2014-09-12 NOTE — Assessment & Plan Note (Addendum)
Weight has now picked up a little bit. Pt asking for medication to help her gain weight. Advised I want to see what her body can do on its own first, not to mask underlying pathology. She has upcoming appt with GI. May benefit from colonoscopy as EGD and gastric emptying studies were unrevealing. She will f/u with me in 1 mo. Encouraged boost, ensure supplements. Gave stool cards again with repeat explanation of how to do them.

## 2014-09-12 NOTE — Assessment & Plan Note (Signed)
BP mildly elevated today, off HCTZ. Will recheck at f/u visit in 1 mo. If still elevated at that time will need to resume medication (likely avoid HCTZ due to previously elevated calcium).

## 2014-09-12 NOTE — Progress Notes (Signed)
Patient ID: Regina Sawyer, female   DOB: Aug 18, 1955, 59 y.o.   MRN: [id number]  HPI:  F/u weight loss: recently pt realized she had been taking a weight loss medication (supplement of vit C and zinc) and has stopped this. Weight has come up a little since last visit. Still feels lots of reflux and gas. Taking omeprazole daily, not ranitidine. Denies fevers or blood in her stool. Has upcoming GI appt next week to f/u. Gastric emptying study was unremarkable. Needs to redo stool cards as were done incorrectly before  HTN: not taking HCTZ since BP was good several times prior to now, and had elevated Ca.   ROS: See HPI.  Robbins: hx HTN, GERD, weight loss, atrophic vaginitis, sickle cell trait  PHYSICAL EXAM: BP 144/88 mmHg  Pulse 84  Temp(Src) 98.3 F (36.8 C) (Oral)  Ht 5\' 2"  (1.575 m)  Wt 118 lb (53.524 kg)  BMI 21.58 kg/m2 Gen: NAD HEENT: NCAT Heart: RRR 1/6 SEM loudest RUSB Lungs: CTAB NWOB Abd: soft NTTP no masses or organomegaly, NABS Neuro: grossly nonfocal speech normal Ext: No appreciable lower extremity edema bilaterally  ASSESSMENT/PLAN:  Weight loss Weight has now picked up a little bit. Pt asking for medication to help her gain weight. Advised I want to see what her body can do on its own first, not to mask underlying pathology. She has upcoming appt with GI. May benefit from colonoscopy as EGD and gastric emptying studies were unrevealing. She will f/u with me in 1 mo. Encouraged boost, ensure supplements. Gave stool cards again with repeat explanation of how to do them.   HYPERTENSION, BENIGN ESSENTIAL BP mildly elevated today, off HCTZ. Will recheck at f/u visit in 1 mo. If still elevated at that time will need to resume medication (likely avoid HCTZ due to previously elevated calcium).    FOLLOW UP: F/u in 1 mo for weight loss & HTN Seeing GI next week  [place] J. Ardelia Mems, [place]

## 2014-09-12 NOTE — Patient Instructions (Signed)
Keep appointment with GI doctor Bring back the stool cards when you are done with them Okay to do boost, ensure, etc to help gain weight Follow up with me in 1 month.  Be well, Dr. Ardelia Mems

## 2014-09-12 NOTE — Progress Notes (Signed)
Patient informed. 

## 2014-09-18 ENCOUNTER — Other Ambulatory Visit: Payer: Self-pay | Admitting: Family Medicine

## 2014-09-18 DIAGNOSIS — R350 Frequency of micturition: Secondary | ICD-10-CM | POA: Diagnosis not present

## 2014-09-18 DIAGNOSIS — R634 Abnormal weight loss: Secondary | ICD-10-CM

## 2014-09-18 DIAGNOSIS — R351 Nocturia: Secondary | ICD-10-CM | POA: Diagnosis not present

## 2014-09-18 DIAGNOSIS — N771 Vaginitis, vulvitis and vulvovaginitis in diseases classified elsewhere: Secondary | ICD-10-CM | POA: Diagnosis not present

## 2014-09-19 ENCOUNTER — Encounter: Payer: Self-pay | Admitting: Family Medicine

## 2014-09-19 ENCOUNTER — Other Ambulatory Visit: Payer: Self-pay | Admitting: *Deleted

## 2014-09-19 DIAGNOSIS — R634 Abnormal weight loss: Secondary | ICD-10-CM

## 2014-09-19 LAB — POC HEMOCCULT BLD/STL (HOME/3-CARD/SCREEN)
Card #3 Fecal Occult Blood, POC: NEGATIVE
FECAL OCCULT BLD: NEGATIVE
FECAL OCCULT BLD: NEGATIVE

## 2014-09-22 ENCOUNTER — Ambulatory Visit (INDEPENDENT_AMBULATORY_CARE_PROVIDER_SITE_OTHER): Payer: Medicare Other | Admitting: Physician Assistant

## 2014-09-22 ENCOUNTER — Encounter: Payer: Self-pay | Admitting: Physician Assistant

## 2014-09-22 ENCOUNTER — Telehealth: Payer: Self-pay | Admitting: Family Medicine

## 2014-09-22 VITALS — BP 140/64 | HR 78 | Ht 62.0 in | Wt 117.0 lb

## 2014-09-22 DIAGNOSIS — R634 Abnormal weight loss: Secondary | ICD-10-CM

## 2014-09-22 DIAGNOSIS — K219 Gastro-esophageal reflux disease without esophagitis: Secondary | ICD-10-CM | POA: Diagnosis not present

## 2014-09-22 MED ORDER — NA SULFATE-K SULFATE-MG SULF 17.5-3.13-1.6 GM/177ML PO SOLN: 1.0000 | Freq: Once | ORAL | Status: DC

## 2014-09-22 MED ORDER — LANSOPRAZOLE 30 MG PO CPDR: 30.0000 mg | DELAYED_RELEASE_CAPSULE | Freq: Every day | ORAL | Status: DC

## 2014-09-22 NOTE — Telephone Encounter (Signed)
Pt informed. Barbera Perritt, CMA  

## 2014-09-22 NOTE — Patient Instructions (Signed)
You have been scheduled for a colonoscopy. Please follow written instructions given to you at your visit today.  Please pick up your prep supplies at the pharmacy within the next 1-3 days. If you use inhalers (even only as needed), please bring them with you on the day of your procedure.   We have sent the following medications to your pharmacy for you to pick up at your convenience:  Prevacid;  Discontinue the Omeprazole

## 2014-09-22 NOTE — Progress Notes (Signed)
Patient ID: Regina Sawyer, female   DOB: Mar 05, 1956, 59 y.o.   MRN: [id number]     History of Present Illness: Shenice Dolder who is known to Dr. Deatra Ina. She was evaluated here in April 2016 for evaluation of abdominal pain and weight loss. For the past 3-4 months prior to that visit she had complained of early satiety with postprandial bloating and excess belching and flatus. She has lost approximately 20 pounds but denied changes in her bowel habits, melena or hematochezia. He had a CT in April that was unremarkable except for uterine fibroids blood work including CBC lipase and comprehensive metabolic profile were unremarkable. She had had a colonoscopy in 2009 by Dr. Benson Norway that was unremarkable. She underwent an EGD on 08/06/2014 and a few sessile polyps were found in the gastric body (see fundic gland polyps). She was scheduled for a gastric emptying scan which was normal. She has completed stool heme occult cards which have all been negative. Recently she was evaluated by her primary care physician and it was noted that her weight had stabilized. She was however advised to have a repeat colonoscopy due to her rapid weight loss over the past several months.  She has been on omeprazole but despite this belches and burps a lot. She denies heartburn per se but does have occasional regurgitation. She reports that she had been on Prevacid in the past which alleviated her symptoms however she is currently on omeprazole and feels it does not help. She is requesting a change back to lansoprazole.   Past Medical History  Diagnosis Date  . Hypertension   . GERD (gastroesophageal reflux disease)   . Heart murmur   . Ulcer   . Depression   . History of bacterial infection   . Sickle cell trait   . Tenderness of female pelvic organs 2005  . H/O vaginitis 2005  . Fibroid 2005  . BV (bacterial vaginosis) 2005  . Candida vaginitis 2006  . Libido, decreased 2006  . Female pelvic pain 2006  . Dysuria 2007    . Irregular periods/menstrual cycles 2007  . Menopausal symptoms 2007  . Hx: UTI (urinary tract infection) 2007  . H/O dyspareunia 2008  . Proteinuria 2009  . Nocturia 2009  . Candida vaginitis 2009  . Atrophic vaginitis 2009  . H/O constipation 2009  . History of hemorrhoids 2009  . Vaginal irritation 2011  . H/O urinary frequency 2011  . Asthma   . Hyperlipidemia     Past Surgical History  Procedure Laterality Date  . Carpal tunnel release      both wrists  . Tubal ligation    . Esophagogastroduodenoscopy N/A 08/06/2014    Procedure: ESOPHAGOGASTRODUODENOSCOPY (EGD);  Surgeon: Inda Castle, MD;  Location: [facility];  Service: Endoscopy;  Laterality: N/A;   Family History  Problem Relation Age of Onset  . Diabetes Sister   . Hypertension Sister   . Diabetes Other   . Colon cancer Father   . Colon polyps Neg Hx   . Kidney disease Neg Hx   . Esophageal cancer Neg Hx   . Heart failure Brother     x2  . Gallbladder disease Neg Hx   . Gallstones Mother    History  Substance Use Topics  . Smoking status: Passive Smoke Exposure - Never Smoker  . Smokeless tobacco: Never Used  . Alcohol Use: No   Current Outpatient Prescriptions  Medication Sig Dispense Refill  . Calcium Carbonate-Vitamin D 600-400 MG-UNIT per  tablet TAKE 1 TABLET BY MOUTH 2 (TWO) TIMES DAILY. 60 tablet 1  . hydrochlorothiazide (HYDRODIURIL) 25 MG tablet TAKE 1 TABLET BY MOUTH EVERY DAY (Patient taking differently: Take 25 mg by mouth daily. ) 90 tablet 1  . hydrocortisone cream 0.5 % Apply 1 application topically 2 (two) times daily. 30 g 0  . ranitidine (ZANTAC) 150 MG tablet Take 1 tablet (150 mg total) by mouth 2 (two) times daily. 180 tablet 1  . lansoprazole (PREVACID) 30 MG capsule Take 1 capsule (30 mg total) by mouth daily at 12 noon. 30 capsule 6  . Na Sulfate-K Sulfate-Mg Sulf SOLN Take 1 kit by mouth once. 354 mL 0   No current facility-administered medications for this visit.    Allergies  Allergen Reactions  . Atorvastatin Other (See Comments)    Abdominal pain, severe nausea, muscle / body aches  . Latex Hives and Itching      Review of Systems: Gen: Denies any fever, chills, sweats, anorexia, fatigue, weakness, malaise, weight loss, and sleep disorder CV: Denies chest pain, angina, palpitations, syncope, orthopnea, PND, peripheral edema, and claudication. Resp: Denies dyspnea at rest, dyspnea with exercise, cough, sputum, wheezing, coughing up blood, and pleurisy. GI: Denies vomiting blood, jaundice, and fecal incontinence.   Denies dysphagia or odynophagia. GU : Denies urinary burning, blood in urine, urinary frequency, urinary hesitancy, nocturnal urination, and urinary incontinence. MS: Denies joint pain, limitation of movement, and swelling, stiffness, low back pain, extremity pain. Denies muscle weakness, cramps, atrophy.  Derm: Denies rash, itching, dry skin, hives, moles, warts, or unhealing ulcers.  Psych: Denies depression, anxiety, memory loss, suicidal ideation, hallucinations, paranoia, and confusion. Heme: Denies bruising, bleeding, and enlarged lymph nodes. Neuro:  Denies any headaches, dizziness, paresthesia Endo:  Denies any problems with DM, thyroid, adrenal  LAB RESULTS: Hemoccults report from her primary care office was negative 3 CBC on 07/23/2014 white blood count 4, hemoglobin 11.8, hematocrit 37.4, platelets 380,000, MCV 85.4. Comprehensive metabolic panel on 74/16/3845 AST 17, ALT 12, alkaline phosphatase 60, total bili 0.5. Studies:  CLINICAL DATA: Abdominal pain. Reflux.  EXAM: NUCLEAR MEDICINE GASTRIC EMPTYING SCAN  TECHNIQUE: After oral ingestion of radiolabeled meal, sequential abdominal images were obtained for 4 hours. Percentage of activity emptying the stomach was calculated at 1 hour, 2 hour, 3 hour, and 4 hours.  RADIOPHARMACEUTICALS: 2.0 mCi of Technetium-32mlabeled sulfur colloid  orally  COMPARISON: None.  FINDINGS: Expected location of the stomach in the left upper quadrant. Ingested meal empties the stomach gradually over the course of the study.  0% emptied at 1 hr ( normal >= 10%)  77% emptied at 2 hr ( normal >= 40%)  98.5% emptied at 3 hr ( normal >= 70%)  IMPRESSION:  Normal gastric emptying study.   Electronically Signed  By: TMarcello MooresRegister Abdomen Pelvis W Contrast   Status: Final result       PACS Images     Show images for CT Abdomen Pelvis W Contrast     Study Result     CLINICAL DATA: Generalized abdominal pain  EXAM: CT ABDOMEN AND PELVIS WITH CONTRAST  TECHNIQUE: Multidetector CT imaging of the abdomen and pelvis was performed using the standard protocol following bolus administration of intravenous contrast. Oral contrast was also administered.  CONTRAST: 542mOMNIPAQUE IOHEXOL 300 MG/ML SOLN, 10035mMNIPAQUE IOHEXOL 300 MG/ML SOLN  COMPARISON: None.  FINDINGS: Lung bases are clear.  No focal liver lesions are identified. There is no appreciable gallbladder wall thickening. Gallbladder  is somewhat more inferiorly located than is generally seen. There is no appreciable biliary duct dilatation.  Spleen, pancreas, and adrenals appear normal. Kidneys bilaterally show no mass or hydronephrosis on either side. There is no renal or ureteral calculus on either side.  In the pelvis, the urinary bladder is midline with normal wall thickness. There is a focal leiomyoma along the anterior leftward aspect of the lower uterine segment measuring 2.2 x 1.8 cm. Uterus has a somewhat inhomogeneous appearance consistent with leiomyomatous change. Apart from the uterus, there is no pelvic mass. There is no free pelvic fluid. The appendix appears unremarkable.  There is no bowel obstruction. No free air or portal venous air. There is no ascites, adenopathy, or abscess in the abdomen or pelvis. There  is no demonstrable abdominal aortic aneurysm. There are no blastic or lytic bone lesions.  IMPRESSION: Leiomyomatous uterus.  No bowel obstruction. No abscess. Appendix region appears normal. No renal or ureteral calculus. No hydronephrosis.   Electronically Signed  By: Lowella Grip III M.D.  On: 07/23/2014 11:47      Physical Exam: General: Pleasant, well developed female in no acute distress Head: Normocephalic and atraumatic Eyes:  sclerae anicteric, conjunctiva pink  Ears: Normal auditory acuity Lungs: Clear throughout to auscultation Heart: Regular rate and rhythm, 1/6 systolic ejection murmur Abdomen: Soft, non distended, non-tender. No masses, no hepatomegaly. Normal bowel sounds Musculoskeletal: Symmetrical with no gross deformities  Extremities: No edema  Neurological: Alert oriented x 4, grossly nonfocal Psychological:  Alert and cooperative. Normal mood and affect  Assessment and Recommendations: 59 year old female with a 3-4 month history of abnormal weight loss returning for further evaluation. Workup thus far has included CT, EGD, and gastric emptying studies all of which were nonrevealing. Patient will be scheduled for colonoscopy to evaluate for polyps or neoplasia.The risks, benefits, and alternatives to colonoscopy with possible biopsy and possible polypectomy were discussed with the patient and they consent to proceed.     Hvozdovic, Vita Barley PA-C 09/22/2014,

## 2014-09-22 NOTE — Progress Notes (Signed)
Reviewed and agree with management. Fawna Cranmer D. Olubunmi Rothenberger, M.D., FACG  

## 2014-09-22 NOTE — Telephone Encounter (Signed)
Covering for Dr. Ardelia Mems. Please let patient know the stool cards were negative 3. She should still follow up with GI as scheduled, which it appears she did today.  Hilton Sinclair, MD

## 2014-09-22 NOTE — Telephone Encounter (Signed)
Pt called and would like to know what her stool results were.jw

## 2014-09-24 ENCOUNTER — Telehealth: Payer: Self-pay | Admitting: *Deleted

## 2014-09-24 ENCOUNTER — Ambulatory Visit (INDEPENDENT_AMBULATORY_CARE_PROVIDER_SITE_OTHER): Payer: Medicare Other | Admitting: *Deleted

## 2014-09-24 VITALS — BP 140/70 | HR 75

## 2014-09-24 DIAGNOSIS — Z136 Encounter for screening for cardiovascular disorders: Secondary | ICD-10-CM

## 2014-09-24 DIAGNOSIS — Z0130 Encounter for examination of blood pressure without abnormal findings: Secondary | ICD-10-CM

## 2014-09-24 NOTE — Progress Notes (Signed)
   Pt in nurse clinic for blood pressure check.  BP 140/70 manually.  Pt stated she had a headache yesterday, but relieved with Tylenol.  She has is having some blurred vision.  Pt advised to make an appt with a provider.  Pt stated understanding.  Pt denies any chest pain, SOB or any other symptoms.  Derl Barrow, RN

## 2014-09-24 NOTE — Telephone Encounter (Signed)
Informed patient that she should be seen at ED if she develops Chest pain, SOB, severe visual changes, changes in uration or severe headaches. Pt stated she is "ok right now".  Advised patient to follow up with PCP for blood pressure concerns.  Appt 10/07/14.  Derl Barrow, RN

## 2014-09-24 NOTE — Telephone Encounter (Signed)
Thank you!  Sheretha Shadd T Marks Scalera, MD  

## 2014-09-24 NOTE — Telephone Encounter (Signed)
-----   Message from Hilton Sinclair, MD sent at 09/24/2014  2:24 PM EDT -----   ----- Message -----    From: Derl Barrow, RN    Sent: 09/24/2014   2:17 PM      To: Hilton Sinclair, MD

## 2014-09-24 NOTE — Progress Notes (Signed)
Covering for Dr Ardelia Mems. Reviewed and agree with this plan. She needs f/u to discuss HTN. In the meantime, she can check BPs at home and should seek immediate care if any severe vision change, headaches, change in urination, chest pain, or dyspnea.  Hilton Sinclair, MD

## 2014-09-26 ENCOUNTER — Other Ambulatory Visit: Payer: Self-pay

## 2014-09-26 DIAGNOSIS — K219 Gastro-esophageal reflux disease without esophagitis: Secondary | ICD-10-CM

## 2014-09-29 ENCOUNTER — Ambulatory Visit: Payer: Medicare Other

## 2014-09-29 ENCOUNTER — Ambulatory Visit: Payer: Medicare Other | Attending: Family Medicine | Admitting: Physical Therapy

## 2014-09-29 DIAGNOSIS — M47812 Spondylosis without myelopathy or radiculopathy, cervical region: Secondary | ICD-10-CM | POA: Diagnosis not present

## 2014-09-29 DIAGNOSIS — M25519 Pain in unspecified shoulder: Secondary | ICD-10-CM | POA: Diagnosis not present

## 2014-09-29 DIAGNOSIS — M25612 Stiffness of left shoulder, not elsewhere classified: Secondary | ICD-10-CM | POA: Diagnosis not present

## 2014-09-29 DIAGNOSIS — M542 Cervicalgia: Secondary | ICD-10-CM | POA: Insufficient documentation

## 2014-09-29 DIAGNOSIS — R293 Abnormal posture: Secondary | ICD-10-CM

## 2014-09-29 NOTE — Patient Instructions (Signed)
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
## 2014-10-07 ENCOUNTER — Encounter: Payer: Self-pay | Admitting: Family Medicine

## 2014-10-07 ENCOUNTER — Ambulatory Visit (INDEPENDENT_AMBULATORY_CARE_PROVIDER_SITE_OTHER): Payer: Medicare Other | Admitting: Family Medicine

## 2014-10-07 VITALS — BP 135/65 | HR 69 | Temp 98.4°F | Ht 62.0 in | Wt 114.0 lb

## 2014-10-07 DIAGNOSIS — R634 Abnormal weight loss: Secondary | ICD-10-CM | POA: Diagnosis not present

## 2014-10-07 DIAGNOSIS — N9089 Other specified noninflammatory disorders of vulva and perineum: Secondary | ICD-10-CM

## 2014-10-07 DIAGNOSIS — R300 Dysuria: Secondary | ICD-10-CM

## 2014-10-07 LAB — POCT URINALYSIS DIPSTICK
Bilirubin, UA: NEGATIVE
GLUCOSE UA: NEGATIVE
Ketones, UA: 15
Leukocytes, UA: NEGATIVE
Nitrite, UA: NEGATIVE
Protein, UA: NEGATIVE
RBC UA: NEGATIVE
Spec Grav, UA: 1.025
UROBILINOGEN UA: 0.2
pH, UA: 5.5

## 2014-10-07 MED ORDER — PREDNISONE 20 MG PO TABS: 20.0000 mg | ORAL_TABLET | Freq: Every day | ORAL | Status: DC

## 2014-10-07 NOTE — Progress Notes (Signed)
Patient ID: Regina Sawyer, female   DOB: 1956-04-14, 59 y.o.   MRN: [id number]  HPI:  Dysuria: has hx of allergy to latex. Used a latex condom about 1-2 weeks ago and since then has had vulvar irritation and itching. Thinks might have UTI. No fevers or inner pelvic pain, just on outside. Has vaginal dryness at baseline.  Weight loss workup update: has upcoming appt for colonoscopy on July 12. Now on lansoprazole for GERD.   ROS: See HPI.  [place]: hx anx/dep, GERD, HLD, HTN, weight loss, postmenopausal  PHYSICAL EXAM: BP 135/65 mmHg  Pulse 69  Temp(Src) 98.4 F (36.9 C) (Oral)  Ht 5\' 2"  (1.575 m)  Wt 114 lb (51.71 kg)  BMI 20.85 kg/m2 Gen: NAD, cooperative Resp: no respiratory distress, normal effort Abd: moves around without any peritoneal signs GU: vulva without discharge or lesions except small ~80mm papule on inner left labia minora. Cervix normal in appearance. Neuro: grossly nonfocal, speech normal  ASSESSMENT/PLAN:  Vulvar lesion Lesion on L inner labia minora concerning for possible genital wart. Patient was very distressed to hear this. Husband has had genital warts and she thought she was protected from those by using a condom. She states she has been applying various soaps, etc to her vulva. I have instructed her to not put anything on vulva except for water for next several weeks, and to follow up with me in 3 weeks for re-evaluation to see if the spot has resolved, as would be expected if itis simply irritation.  I suspect she also has a component of latex allergic rxn causing her itching. After discussion with pt elected to rx medium dose prednisone 20mg  daily x 5 days for itch relief (do not want to apply steroid directly to possible wart). F/u in 3 weeks for repeat pelvic exam.  Weight loss Weight today 114lb, down several pounds from prior visits but still one pound above lowest recent weight. Has upcoming colonoscopy to continue working this up.   FOLLOW UP: F/u in  3 weeks for repeat pelvic exam  [place] J. Ardelia Mems, [place]

## 2014-10-07 NOTE — Assessment & Plan Note (Addendum)
Lesion on L inner labia minora concerning for possible genital wart. Patient was very distressed to hear this. Husband has had genital warts and she thought she was protected from those by using a condom. She states she has been applying various soaps, etc to her vulva. I have instructed her to not put anything on vulva except for water for next several weeks, and to follow up with me in 3 weeks for re-evaluation to see if the spot has resolved, as would be expected if itis simply irritation.  I suspect she also has a component of latex allergic rxn causing her itching. After discussion with pt elected to rx medium dose prednisone 20mg  daily x 5 days for itch relief (do not want to apply steroid directly to possible wart). F/u in 3 weeks for repeat pelvic exam.

## 2014-10-07 NOTE — Patient Instructions (Addendum)
For itching in genital area: -likely reaction to the latex condom you used -prednisone 20mg  a day for 5 days. Take in the morning.  Spot on genitals: -don't use any cleaners or soaps on your genital area -may be a wart. Come back in 3-4 weeks so we can recheck this. Return sooner if you notice any new spots.  Be well, Dr. Ardelia Mems

## 2014-10-07 NOTE — Assessment & Plan Note (Addendum)
Weight today 114lb, down several pounds from prior visits but still one pound above lowest recent weight. Has upcoming colonoscopy to continue working this up.

## 2014-10-08 DIAGNOSIS — N9489 Other specified conditions associated with female genital organs and menstrual cycle: Secondary | ICD-10-CM | POA: Diagnosis not present

## 2014-10-08 DIAGNOSIS — F419 Anxiety disorder, unspecified: Secondary | ICD-10-CM | POA: Diagnosis not present

## 2014-10-09 ENCOUNTER — Telehealth: Payer: Self-pay | Admitting: *Deleted

## 2014-10-09 NOTE — Telephone Encounter (Signed)
Pt in nurse clinic today demanding that Dr. Ardelia Mems remove the state that she had genital warts.  Elwin Sleight, RN reviewed office notes from patient's visit on 10/07/14.  Note stated that pt had a lesion on L inner labia minora concerning for possible genital wart.  Informed patient that Dr. Ardelia Mems did not state she had warts on only that is was a possibility.  That's what the doctor observed and assessed.  Pt got very upset and stated "my am about to show my ass."  Nurse advised patient that if she started to curse again she would be ask to leave the office.  Tried to explain to patient that she would have to schedule an appt with Dr. Ardelia Mems because the could not remove anything from her record that someone else noted in the chart.  Pt started to curse at the nurse again.  The nurse told the patient she would need to leave her office.  Pt stated will I just want to explain myself.  Nurse told her she should like think about what she wanted to say before starting to curse.  If she cursed again there would not be anymore chances she would be asked to leave the building.  Pt stated she went to another provider and he told her she did not have genital warts.  Pt stated again she wanted that off her paperwork and her chart.  Explained to patient again that she would need to speak with Dr. Ardelia Mems regarding her concerns and to make an appointment.  The nurse can not and will not change her record or discharge paperwork.  Appointment scheduled for 10/14/14 on same day schedule with Dr. Ardelia Mems.  Dr. Ardelia Mems stated it was ok to put patient on the schedule.  Derl Barrow, RN

## 2014-10-13 ENCOUNTER — Ambulatory Visit: Payer: Medicare Other | Admitting: Physical Therapy

## 2014-10-14 ENCOUNTER — Ambulatory Visit (INDEPENDENT_AMBULATORY_CARE_PROVIDER_SITE_OTHER): Payer: Medicare Other | Admitting: Family Medicine

## 2014-10-14 ENCOUNTER — Encounter: Payer: Self-pay | Admitting: Family Medicine

## 2014-10-14 ENCOUNTER — Telehealth: Payer: Self-pay | Admitting: Gastroenterology

## 2014-10-14 VITALS — BP 147/71 | HR 75 | Temp 98.0°F

## 2014-10-14 DIAGNOSIS — N9089 Other specified noninflammatory disorders of vulva and perineum: Secondary | ICD-10-CM

## 2014-10-14 DIAGNOSIS — F418 Other specified anxiety disorders: Secondary | ICD-10-CM

## 2014-10-14 DIAGNOSIS — F419 Anxiety disorder, unspecified: Principal | ICD-10-CM

## 2014-10-14 DIAGNOSIS — F32A Depression, unspecified: Secondary | ICD-10-CM

## 2014-10-14 DIAGNOSIS — F329 Major depressive disorder, single episode, unspecified: Secondary | ICD-10-CM

## 2014-10-14 NOTE — Telephone Encounter (Signed)
Patient complains of intestinal gas and bloating. She has Gas-X. She will try this. May need to alter diet to avoid foods that she notices to make her symptoms worse.

## 2014-10-14 NOTE — Patient Instructions (Signed)
Follow up with me in 3 weeks (after you have the colonoscopy).  Be well, Dr. Ardelia Mems

## 2014-10-14 NOTE — Assessment & Plan Note (Signed)
Discussed starting zoloft for the significant anxiety she has been having. She is agreeable to this. Discussed that potential side effects include mild GI upset initially. For this reason, we decided to wait until after her colonoscopy to start medication. She has colonoscopy on July 12, and will follow up with me after the colonoscopy.

## 2014-10-14 NOTE — Progress Notes (Signed)
Patient ID: Regina Sawyer, female   DOB: [birth year], 59 y.o.   MRN: [id number]  HPI:  Pt presents to discuss the vulvar lesion I saw during her visit last week. I had told her this could be a wart and asked her to follow up in 3 weeks for reassessment. She was seen by an OB/GYN who did not feel this was a wart, but rather skin irritation from shaving according to patient. Pt is very distressed by the thought that she could possibly have a wart and does not like having wart written in her medical record. Pt endorses using perfumed soaps and shaving in pelvic area.  She endorses frequent anxiety about her stomach issues and weight loss. Denies SI/HI. No prior mental health hospitalizations or mental health diagnoses according to patient. Denies having a period of time in the past where she went days without sleeping, talked quickly or loudly, or felt invincible. She would be willing to take medication for anxiety. Has upcoming colonoscopy on July 12.  ROS: See HPI.  [place]: hx chronic anemia, anx/dep, GERD, HTN, HLD, sickle cell trait  PHYSICAL EXAM: BP 147/71 mmHg  Pulse 75  Temp(Src) 98 F (36.7 C)  Wt  Gen: NAD HEENT: NCAT Abd: soft, NTTP. No masses, no peritoneal signs Psych: anxious affect, mild psychomotor agitation, no SI/HI, no evidence of responding to internal stimuli  ASSESSMENT/PLAN:  Anxiety and depression Discussed starting zoloft for the significant anxiety she has been having. She is agreeable to this. Discussed that potential side effects include mild GI upset initially. For this reason, we decided to wait until after her colonoscopy to start medication. She has colonoscopy on July 12, and will follow up with me after the colonoscopy.  Vulvar lesion Pt seen by OB/GYN and told lesion is not a wart. Did not examine today as I think this would just increase her anxiety about the entire situation. Advised if lesion changes or grows I am happy to re-examine it in the  future.   FOLLOW UP: F/u in 3 weeks for anx/dep and stomach issues.  [place]. Ardelia Mems, [place]

## 2014-10-14 NOTE — Assessment & Plan Note (Signed)
Pt seen by OB/GYN and told lesion is not a wart. Did not examine today as I think this would just increase her anxiety about the entire situation. Advised if lesion changes or grows I am happy to re-examine it in the future.

## 2014-10-15 ENCOUNTER — Ambulatory Visit: Payer: Medicare Other | Admitting: Physical Therapy

## 2014-10-15 DIAGNOSIS — M47812 Spondylosis without myelopathy or radiculopathy, cervical region: Secondary | ICD-10-CM | POA: Diagnosis not present

## 2014-10-15 DIAGNOSIS — M25612 Stiffness of left shoulder, not elsewhere classified: Secondary | ICD-10-CM

## 2014-10-15 DIAGNOSIS — R293 Abnormal posture: Secondary | ICD-10-CM

## 2014-10-15 DIAGNOSIS — M542 Cervicalgia: Secondary | ICD-10-CM

## 2014-10-15 DIAGNOSIS — M25519 Pain in unspecified shoulder: Secondary | ICD-10-CM

## 2014-10-15 NOTE — Therapy (Signed)
[place], [place], [address] Phone: [phone]   Fax:  [phone]  Physical Therapy Treatment  Patient Details  Name: Regina Sawyer MRN: [id number] Date of Birth: 1956/01/17 Referring Provider:  Leeanne Rio, MD  Encounter Date: 10/15/2014      PT End of Session - 10/15/14 0847    Visit Number 2   Number of Visits 16   PT Start Time 0847      Past Medical History  Diagnosis Date  . Hypertension   . GERD (gastroesophageal reflux disease)   . Heart murmur   . Ulcer   . Depression   . History of bacterial infection   . Sickle cell trait   . Tenderness of female pelvic organs 2005  . H/O vaginitis 2005  . Fibroid 2005  . BV (bacterial vaginosis) 2005  . Candida vaginitis 2006  . Libido, decreased 2006  . Female pelvic pain 2006  . Dysuria 2007  . Irregular periods/menstrual cycles 2007  . Menopausal symptoms 2007  . Hx: UTI (urinary tract infection) 2007  . H/O dyspareunia 2008  . Proteinuria 2009  . Nocturia 2009  . Candida vaginitis 2009  . Atrophic vaginitis 2009  . H/O constipation 2009  . History of hemorrhoids 2009  . Vaginal irritation 2011  . H/O urinary frequency 2011  . Asthma   . Hyperlipidemia     Past Surgical History  Procedure Laterality Date  . Carpal tunnel release      both wrists  . Tubal ligation    . Esophagogastroduodenoscopy N/A 08/06/2014    Procedure: ESOPHAGOGASTRODUODENOSCOPY (EGD);  Surgeon: Inda Castle, MD;  Location: [facility];  Service: Endoscopy;  Laterality: N/A;    There were no vitals filed for this visit.  Visit Diagnosis:  Localized pain of shoulder joint  Stiffness of shoulder joint, left  Cervical spine degeneration  Neck and shoulder pain  Abnormal posture      Subjective Assessment - 10/15/14 0848    Subjective A little better, took a Tylenol.  No pain this am, Rt. arm pain off and on.     Currently in Pain?  No/denies            [facility] PT Assessment - 10/15/14 0908    AROM   Right Shoulder Flexion 145 Degrees   Right Shoulder ABduction 115 Degrees  pain   Strength   Right Shoulder Flexion 4/5   Right Shoulder ABduction 4+/5           [facility] Adult PT Treatment/Exercise - 10/15/14 0858    Neck Exercises: Machines for Strengthening   UBE (Upper Arm Bike) level 1, 5 min    Shoulder Exercises: Supine   Horizontal ABduction Strengthening;Both;10 reps;Theraband   Theraband Level (Shoulder Horizontal ABduction) Level 2 (Red)   External Rotation Strengthening;Both;10 reps;Theraband   Theraband Level (Shoulder External Rotation) Level 2 (Red)   Internal Rotation Strengthening;Right;10 reps   Flexion Strengthening;Right;10 reps   Theraband Level (Shoulder Flexion) Level 2 (Red)   Other Supine Exercises extension red band x 10 supine   Shoulder Exercises: Standing   Extension Strengthening;Both;20 reps;Theraband   Theraband Level (Shoulder Extension) Level 2 (Red)   Row Strengthening;Both;20 reps;Theraband   Theraband Level (Shoulder Row) Level 2 (Red)   Shoulder Exercises: ROM/Strengthening   Other ROM/Strengthening Exercises supine cane overhead and adduction x 10    Shoulder Exercises: Stretch   Other Shoulder Stretches corner stretch, doorway x 3, 30 sec each  PT Education - 10/15/14 [phone]    Education provided Yes   Education Details HEP, posture   Person(s) Educated Patient   Methods Explanation;Demonstration;Handout;Verbal cues;Tactile cues   Comprehension Verbalized understanding;Returned demonstration;Need further instruction          PT Short Term Goals - 10/15/14 1884    PT SHORT TERM GOAL #1   Title Pt will be I with HEP for cervical spine and Rt. UE   Status On-going   PT SHORT TERM GOAL #2   Title Pt will be able to turn head and report min pain    Status Achieved   PT SHORT TERM GOAL #3   Title Pt will be able to reach to shoulder height  without UE pain    Status On-going           PT Long Term Goals - 10/15/14 0921    PT LONG TERM GOAL #1   Title Patient will be I with advanced HEP for posture and strength   Status On-going   PT LONG TERM GOAL #2   Title Pt will demo normal strength (4+/5 or more)  in bilateral UE without pain    Status On-going   PT LONG TERM GOAL #3   Title Pt will be able to perform ADLs with min difficulty/ pain   Status On-going   PT LONG TERM GOAL #4   Title Pt will demofull AROM in C spine with min discomfort   Status On-going   PT LONG TERM GOAL #5   Title Pt will show functional gains by scoring <40% limited on FOTO.    Status Unable to assess               Plan - 10/15/14 1660    Clinical Impression Statement Patient improving naturally, 2nd visit.  She was given education on posture and HEP to address imbalances.     PT Next Visit Plan check HEP, modalities if needed, posture   PT Home Exercise Plan row, corner stretch and h abd   Consulted and Agree with Plan of Care Patient        Problem List Patient Active Problem List   Diagnosis Date Noted  . Vulvar lesion 10/07/2014  . Abdominal pain, epigastric 07/10/2014  . Dry mouth 07/10/2014  . Weight loss 06/04/2014  . Prediabetes 05/13/2014  . Elevated serum creatinine 05/12/2014  . Injury of left index finger 01/23/2014  . Arm pain, left 01/23/2014  . Cervical polyp 10/14/2013  . Vaginal itching 11/03/2012  . Cystitis 10/16/2012  . Preventative health care 07/25/2012  . Hyperlipidemia 08/26/2009  . Anxiety and depression 07/30/2008  . VAGINITIS, ATROPHIC 05/08/2008  . POSTMENOPAUSAL BLEEDING 11/29/2007  . SYSTOLIC MURMUR 63/04/6008  . HYPERTENSION, BENIGN ESSENTIAL 02/05/2007  . ANEMIA, IRON DEFICIENCY, UNSPEC. 06/15/2006  . SICKLE CELL TRAIT 06/15/2006  . RHINITIS, ALLERGIC 06/15/2006  . GASTROESOPHAGEAL REFLUX, NO ESOPHAGITIS 06/15/2006    PAA,JENNIFER 10/15/2014, 9:24 AM  [facility] [address] [place], [place], [address] Phone: [phone]   Fax:  [phone] Raeford Razor, PT 10/15/2014 9:25 AM Phone: [phone] Fax: [phone]

## 2014-10-15 NOTE — Patient Instructions (Signed)
Resisted Horizontal Abduction: Bilateral   Sit or stand, tubing in both hands, arms out in front. Keeping arms straight, pinch shoulder blades together and stretch arms out. Repeat __10-20__ times per set. Do ___1-2_ sets per session. Do _2___ sessions per day.  http://orth.exer.us/969   Copyright  VHI. All rights reserved.  Flexibility: Corner Stretch   Standing in corner with hands just above shoulder level and feet __10-12__ inches from corner, lean forward until a comfortable stretch is felt across chest. Hold _30___ seconds. Repeat ___2-3_ times per set. Do ___1-2_ sets per session. Do __2__ sessions per day.  http://orth.exer.us/343   Copyright  VHI. All rights reserved.  Scapular Retraction: Rowing (Eccentric) - Arms - 45 Degrees (Resistance Band)   Hold end of band in each hand. Pull back until elbows are even with trunk. Keep elbows out from sides at 45, thumbs up. Slowly release for 3-5 seconds. Use ___RED_____ resistance band. _10-20__ reps per set, __1-2_ sets per day, _5-7__ days per week.   Copyright  VHI. All rights reserved.

## 2014-10-21 ENCOUNTER — Ambulatory Visit: Payer: Medicare Other | Admitting: Physical Therapy

## 2014-10-22 ENCOUNTER — Encounter (HOSPITAL_COMMUNITY): Payer: Self-pay | Admitting: *Deleted

## 2014-10-23 ENCOUNTER — Ambulatory Visit: Payer: Medicare Other | Attending: Family Medicine | Admitting: Physical Therapy

## 2014-10-23 DIAGNOSIS — M25612 Stiffness of left shoulder, not elsewhere classified: Secondary | ICD-10-CM | POA: Insufficient documentation

## 2014-10-23 DIAGNOSIS — M542 Cervicalgia: Secondary | ICD-10-CM | POA: Insufficient documentation

## 2014-10-23 DIAGNOSIS — M47812 Spondylosis without myelopathy or radiculopathy, cervical region: Secondary | ICD-10-CM | POA: Diagnosis not present

## 2014-10-23 DIAGNOSIS — M25519 Pain in unspecified shoulder: Secondary | ICD-10-CM | POA: Diagnosis not present

## 2014-10-23 DIAGNOSIS — R293 Abnormal posture: Secondary | ICD-10-CM | POA: Insufficient documentation

## 2014-10-23 NOTE — Patient Instructions (Signed)

## 2014-10-23 NOTE — Therapy (Signed)
[place] [place], [place], [address] Phone: [phone]   Fax:  [phone]  Physical Therapy Treatment  Patient Details  Name: Regina Sawyer MRN: [id number] Date of Birth: 1955-10-26 Referring Provider:  Leeanne Rio, MD  Encounter Date: 10/23/2014      PT End of Session - 10/23/14 0904    Visit Number 3   Number of Visits 16   PT Start Time 0810   PT Stop Time 0850   PT Time Calculation (min) 40 min   Activity Tolerance Patient tolerated treatment well   Behavior During Therapy [facility] for tasks assessed/performed      Past Medical History  Diagnosis Date  . Hypertension   . GERD (gastroesophageal reflux disease)   . Heart murmur   . Ulcer   . Depression   . History of bacterial infection   . Sickle cell trait   . Tenderness of female pelvic organs 2005  . H/O vaginitis 2005  . Fibroid 2005  . BV (bacterial vaginosis) 2005  . Candida vaginitis 2006  . Libido, decreased 2006  . Female pelvic pain 2006  . Dysuria 2007  . Irregular periods/menstrual cycles 2007  . Menopausal symptoms 2007  . Hx: UTI (urinary tract infection) 2007  . H/O dyspareunia 2008  . Proteinuria 2009  . Nocturia 2009  . Candida vaginitis 2009  . Atrophic vaginitis 2009  . H/O constipation 2009  . History of hemorrhoids 2009  . Vaginal irritation 2011  . H/O urinary frequency 2011  . Asthma   . Hyperlipidemia     Past Surgical History  Procedure Laterality Date  . Carpal tunnel release      both wrists  . Tubal ligation    . Esophagogastroduodenoscopy N/A 08/06/2014    Procedure: ESOPHAGOGASTRODUODENOSCOPY (EGD);  Surgeon: Inda Castle, MD;  Location: [facility];  Service: Endoscopy;  Laterality: N/A;    There were no vitals filed for this visit.  Visit Diagnosis:  Stiffness of shoulder joint, left  Abnormal posture      Subjective Assessment - 10/23/14 0816    Subjective No pain lately.   Pain  Score 0-No pain   Pain Location --  Shoulder   Pain Orientation Right   Pain Descriptors / Indicators Aching  toothach   Pain Frequency Occasional   Aggravating Factors  poor weather, deep cleaning   Pain Relieving Factors tylenol   Multiple Pain Sites No                         [facility] Adult PT Treatment/Exercise - 10/23/14 0815    Posture/Postural Control   Posture Comments issued and briefly reviewed ADL and body mechanics.   Neck Exercises: Machines for Strengthening   UBE (Upper Arm Bike) --  1.5 level. forward reverse 5 minutes   Other Machines for Strengthening lat pull down 1.5, 2 plates min assist , cues many   Shoulder Exercises: Supine   Horizontal ABduction Strengthening;Both;10 reps;Theraband   Theraband Level (Shoulder Horizontal ABduction) Level 2 (Red)  10 reps, cues   External Rotation Strengthening;Both;10 reps;Theraband   Theraband Level (Shoulder External Rotation) Level 2 (Red)  10 reps   Shoulder Exercises: Seated   Horizontal ABduction 10 reps  cues   Theraband Level (Shoulder External Rotation) Level 2 (Red);Level 3 (Green)   External Rotation Limitations 1set red, 1 set green , cues elbow position, slower   Shoulder Exercises: Standing   Extension Strengthening;Both;20  reps;Theraband   Theraband Level (Shoulder Extension) Level 2 (Red)  10 reps many cues   Row Strengthening   Theraband Level (Shoulder Row) Level 3 (Green)  bent and straight arms   Shoulder Exercises: Pulleys   Flexion --  5 minutes   Shoulder Exercises: Stretch   External Rotation Stretch --  doorway 3 X 30. cues avoid bouncing etc.                PT Education - 10/23/14 0904    Education provided Yes   Education Details exercise techniques, ADL posture and body mechanics   Person(s) Educated Patient   Methods Explanation;Demonstration;Tactile cues;Verbal cues;Handout   Comprehension Verbalized understanding;Returned demonstration;Verbal cues  required;Need further instruction          PT Short Term Goals - 10/23/14 0913    PT SHORT TERM GOAL #1   Title Pt will be I with HEP for cervical spine and Rt. UE   Baseline cues needed for technique   Time 4   Period Weeks   Status On-going   PT SHORT TERM GOAL #2   Title Pt will be able to turn head and report min pain    Time 4   Period Weeks   Status Unable to assess   PT SHORT TERM GOAL #3   Title Pt will be able to reach to shoulder height without UE pain    Baseline no pain with this   Time 4   Period Weeks   Status Achieved           PT Long Term Goals - 10/15/14 5830    PT LONG TERM GOAL #1   Title Patient will be I with advanced HEP for posture and strength   Status On-going   PT LONG TERM GOAL #2   Title Pt will demo normal strength (4+/5 or more)  in bilateral UE without pain    Status On-going   PT LONG TERM GOAL #3   Title Pt will be able to perform ADLs with min difficulty/ pain   Status On-going   PT LONG TERM GOAL #4   Title Pt will demofull AROM in C spine with min discomfort   Status On-going   PT LONG TERM GOAL #5   Title Pt will show functional gains by scoring <40% limited on FOTO.    Status Unable to assess               Plan - 10/23/14 9407    PT Next Visit Plan check Home exercise, consider supine posture exercises with bands.    Neck ROM for home?   PT Home Exercise Plan continue bands and stretching.   Consulted and Agree with Plan of Care Patient    STG #2,# met today.    Problem List Patient Active Problem List   Diagnosis Date Noted  . Vulvar lesion 10/07/2014  . Abdominal pain, epigastric 07/10/2014  . Dry mouth 07/10/2014  . Weight loss 06/04/2014  . Prediabetes 05/13/2014  . Elevated serum creatinine 05/12/2014  . Injury of left index finger 01/23/2014  . Arm pain, left 01/23/2014  . Cervical polyp 10/14/2013  . Vaginal itching 11/03/2012  . Cystitis 10/16/2012  . Preventative health care 07/25/2012  .  Hyperlipidemia 08/26/2009  . Anxiety and depression 07/30/2008  . VAGINITIS, ATROPHIC 05/08/2008  . POSTMENOPAUSAL BLEEDING 11/29/2007  . SYSTOLIC MURMUR 68/11/8108  . HYPERTENSION, BENIGN ESSENTIAL 02/05/2007  . ANEMIA, IRON DEFICIENCY, UNSPEC. 06/15/2006  . SICKLE CELL TRAIT  06/15/2006  . RHINITIS, ALLERGIC 06/15/2006  . GASTROESOPHAGEAL REFLUX, NO ESOPHAGITIS 06/15/2006    [facility] 10/23/2014, 9:16 AM  [facility] [address] [place], [place], [address] Phone: [phone]   Fax:  [phone]     Melvenia Needles, PTA 10/23/2014 9:16 AM Phone: [phone] Fax: [phone]

## 2014-10-24 ENCOUNTER — Telehealth: Payer: Self-pay | Admitting: Gastroenterology

## 2014-10-24 MED ORDER — LANSOPRAZOLE 30 MG PO CPDR: 30.0000 mg | DELAYED_RELEASE_CAPSULE | Freq: Every day | ORAL | Status: DC

## 2014-10-24 NOTE — Telephone Encounter (Signed)
Med has been sent to pharmacy   Called patient to inform med sent

## 2014-10-28 ENCOUNTER — Ambulatory Visit (HOSPITAL_COMMUNITY)
Admission: RE | Admit: 2014-10-28 | Discharge: 2014-10-28 | Disposition: A | Discharge status: Home / Self Care | Payer: Medicare Other | Source: Ambulatory Visit | Attending: Gastroenterology | Admitting: Gastroenterology

## 2014-10-28 ENCOUNTER — Encounter (HOSPITAL_COMMUNITY): Payer: Self-pay

## 2014-10-28 ENCOUNTER — Ambulatory Visit (HOSPITAL_COMMUNITY): Payer: Medicare Other | Admitting: Anesthesiology

## 2014-10-28 ENCOUNTER — Ambulatory Visit: Payer: Medicare Other | Admitting: Physical Therapy

## 2014-10-28 ENCOUNTER — Encounter (HOSPITAL_COMMUNITY)
Admission: RE | Disposition: A | Discharge status: Home / Self Care | Payer: Self-pay | Source: Ambulatory Visit | Attending: Gastroenterology

## 2014-10-28 DIAGNOSIS — K219 Gastro-esophageal reflux disease without esophagitis: Secondary | ICD-10-CM | POA: Diagnosis not present

## 2014-10-28 DIAGNOSIS — N898 Other specified noninflammatory disorders of vagina: Secondary | ICD-10-CM | POA: Insufficient documentation

## 2014-10-28 DIAGNOSIS — I10 Essential (primary) hypertension: Secondary | ICD-10-CM | POA: Insufficient documentation

## 2014-10-28 DIAGNOSIS — E780 Pure hypercholesterolemia: Secondary | ICD-10-CM | POA: Insufficient documentation

## 2014-10-28 DIAGNOSIS — F419 Anxiety disorder, unspecified: Secondary | ICD-10-CM | POA: Diagnosis not present

## 2014-10-28 DIAGNOSIS — J45909 Unspecified asthma, uncomplicated: Secondary | ICD-10-CM | POA: Diagnosis not present

## 2014-10-28 DIAGNOSIS — D122 Benign neoplasm of ascending colon: Secondary | ICD-10-CM

## 2014-10-28 DIAGNOSIS — D571 Sickle-cell disease without crisis: Secondary | ICD-10-CM | POA: Insufficient documentation

## 2014-10-28 DIAGNOSIS — K648 Other hemorrhoids: Secondary | ICD-10-CM

## 2014-10-28 DIAGNOSIS — R634 Abnormal weight loss: Secondary | ICD-10-CM

## 2014-10-28 DIAGNOSIS — K649 Unspecified hemorrhoids: Secondary | ICD-10-CM | POA: Diagnosis not present

## 2014-10-28 HISTORY — PX: COLONOSCOPY: SHX5424

## 2014-10-28 SURGERY — COLONOSCOPY
Anesthesia: Monitor Anesthesia Care

## 2014-10-28 MED ORDER — PROPOFOL 10 MG/ML IV BOLUS
INTRAVENOUS | Status: AC
  Filled 2014-10-28: qty 20

## 2014-10-28 MED ORDER — PROPOFOL 10 MG/ML IV BOLUS
INTRAVENOUS | Status: DC | PRN
  Administered 2014-10-28: 40 mg via INTRAVENOUS
  Administered 2014-10-28: 20 mg via INTRAVENOUS

## 2014-10-28 MED ORDER — PROPOFOL INFUSION 10 MG/ML OPTIME
INTRAVENOUS | Status: DC | PRN
  Administered 2014-10-28: 140 ug/kg/min via INTRAVENOUS

## 2014-10-28 MED ORDER — MIDAZOLAM HCL 2 MG/2ML IJ SOLN
INTRAMUSCULAR | Status: DC | PRN
Start: 2014-10-28 — End: 2014-10-28
  Administered 2014-10-28: 2 mg via INTRAVENOUS

## 2014-10-28 MED ORDER — LACTATED RINGERS IV SOLN
INTRAVENOUS | Status: DC | PRN
  Administered 2014-10-28: 10:00:00 via INTRAVENOUS

## 2014-10-28 MED ORDER — SODIUM CHLORIDE 0.9 % IV SOLN: INTRAVENOUS | Status: DC

## 2014-10-28 MED ORDER — MIDAZOLAM HCL 2 MG/2ML IJ SOLN
INTRAMUSCULAR | Status: AC
  Filled 2014-10-28: qty 2

## 2014-10-28 NOTE — Discharge Instructions (Signed)
Colonoscopy A colonoscopy is an exam to look at the entire large intestine (colon). This exam can help find problems such as tumors, polyps, inflammation, and areas of bleeding. The exam takes about 1 hour.  LET [facility] CARE PROVIDER KNOW ABOUT:   Any allergies you have.  All medicines you are taking, including vitamins, herbs, eye drops, creams, and over-the-counter medicines.  Previous problems you or members of your family have had with the use of anesthetics.  Any blood disorders you have.  Previous surgeries you have had.  Medical conditions you have. RISKS AND COMPLICATIONS  Generally, this is a safe procedure. However, as with any procedure, complications can occur. Possible complications include:  Bleeding.  Tearing or rupture of the colon wall.  Reaction to medicines given during the exam.  Infection (rare). BEFORE THE PROCEDURE   Ask your health care provider about changing or stopping your regular medicines.  You may be prescribed an oral bowel prep. This involves drinking a large amount of medicated liquid, starting the day before your procedure. The liquid will cause you to have multiple loose stools until your stool is almost clear or light green. This cleans out your colon in preparation for the procedure.  Do not eat or drink anything else once you have started the bowel prep, unless your health care provider tells you it is safe to do so.  Arrange for someone to drive you home after the procedure. PROCEDURE   You will be given medicine to help you relax (sedative).  You will lie on your side with your knees bent.  A long, flexible tube with a light and camera on the end (colonoscope) will be inserted through the rectum and into the colon. The camera sends video back to a computer screen as it moves through the colon. The colonoscope also releases carbon dioxide gas to inflate the colon. This helps your health care provider see the area better.  During  the exam, your health care provider may take a small tissue sample (biopsy) to be examined under a microscope if any abnormalities are found.  The exam is finished when the entire colon has been viewed. AFTER THE PROCEDURE   Do not drive for 24 hours after the exam.  You may have a small amount of blood in your stool.  You may pass moderate amounts of gas and have mild abdominal cramping or bloating. This is caused by the gas used to inflate your colon during the exam.  Ask when your test results will be ready and how you will get your results. Make sure you get your test results. Document Released: 04/01/2000 Document Revised: 01/23/2013 Document Reviewed: 12/10/2012 [facility] Patient Information 2015 [place], [place]. This information is not intended to replace advice given to you by your health care provider. Make sure you discuss any questions you have with your health care provider. Monitored Anesthesia Care Monitored anesthesia care is an anesthesia service for a medical procedure. Anesthesia is the loss of the ability to feel pain. It is produced by medicines called anesthetics. It may affect a small area of your body (local anesthesia), a large area of your body (regional anesthesia), or your entire body (general anesthesia). The need for monitored anesthesia care depends your procedure, your condition, and the potential need for regional or general anesthesia. It is often provided during procedures where:   General anesthesia may be needed if there are complications. This is because you need special care when you are under general anesthesia.  You will be under local or regional anesthesia. This is so that you are able to have higher levels of anesthesia if needed.   You will receive calming medicines (sedatives). This is especially the case if sedatives are given to put you in a semi-conscious state of relaxation (deep sedation). This is because the amount of sedative needed to  produce this state can be hard to predict. Too much of a sedative can produce general anesthesia. Monitored anesthesia care is performed by one or more health care providers who have special training in all types of anesthesia. You will need to meet with these health care providers before your procedure. During this meeting, they will ask you about your medical history. They will also give you instructions to follow. (For example, you will need to stop eating and drinking before your procedure. You may also need to stop or change medicines you are taking.) During your procedure, your health care providers will stay with you. They will:   Watch your condition. This includes watching your blood pressure, breathing, and level of pain.   Diagnose and treat problems that occur.   Give medicines if they are needed. These may include calming medicines (sedatives) and anesthetics.   Make sure you are comfortable.  Having monitored anesthesia care does not necessarily mean that you will be under anesthesia. It does mean that your health care providers will be able to manage anesthesia if you need it or if it occurs. It also means that you will be able to have a different type of anesthesia than you are having if you need it. When your procedure is complete, your health care providers will continue to watch your condition. They will make sure any medicines wear off before you are allowed to go home.  Document Released: 12/29/2004 Document Revised: 08/19/2013 Document Reviewed: 05/16/2012 [facility] Patient Information 2015 [place], [place]. This information is not intended to replace advice given to you by your health care provider. Make sure you discuss any questions you have with your health care provider.

## 2014-10-28 NOTE — Anesthesia Postprocedure Evaluation (Signed)
  Anesthesia Post-op Note  Patient: Angelia Wamboldt  Procedure(s) Performed: Procedure(s) (LRB): COLONOSCOPY (N/A)  Patient Location: PACU  Anesthesia Type: MAC  Level of Consciousness: awake and alert   Airway and Oxygen Therapy: Patient Spontanous Breathing  Post-op Pain: mild  Post-op Assessment: Post-op Vital signs reviewed, Patient's Cardiovascular Status Stable, Respiratory Function Stable, Patent Airway and No signs of Nausea or vomiting  Last Vitals:  Filed Vitals:   10/28/14 1134  BP:   Pulse: 64  Temp:   Resp: 14    Post-op Vital Signs: stable   Complications: No apparent anesthesia complications

## 2014-10-28 NOTE — Anesthesia Preprocedure Evaluation (Signed)
Anesthesia Evaluation  Patient identified by MRN, date of birth, ID band Patient awake    Reviewed: Allergy & Precautions, NPO status , Patient's Chart, lab work & pertinent test results  Airway Mallampati: II  TM Distance: >3 FB Neck ROM: Full    Dental no notable dental hx.    Pulmonary asthma ,  breath sounds clear to auscultation  Pulmonary exam normal       Cardiovascular hypertension, Pt. on medications Normal cardiovascular examRhythm:Regular Rate:Normal     Neuro/Psych negative neurological ROS  negative psych ROS   GI/Hepatic negative GI ROS, Neg liver ROS,   Endo/Other  negative endocrine ROS  Renal/GU negative Renal ROS  negative genitourinary   Musculoskeletal negative musculoskeletal ROS (+)   Abdominal   Peds negative pediatric ROS (+)  Hematology negative hematology ROS (+)   Anesthesia Other Findings   Reproductive/Obstetrics negative OB ROS                             Anesthesia Physical Anesthesia Plan  ASA: II  Anesthesia Plan: MAC   Post-op Pain Management:    Induction:   Airway Management Planned: Simple Face Mask  Additional Equipment:   Intra-op Plan:   Post-operative Plan:   Informed Consent: I have reviewed the patients History and Physical, chart, labs and discussed the procedure including the risks, benefits and alternatives for the proposed anesthesia with the patient or authorized representative who has indicated his/her understanding and acceptance.   Dental advisory given  Plan Discussed with: CRNA  Anesthesia Plan Comments:         Anesthesia Quick Evaluation

## 2014-10-28 NOTE — Op Note (Signed)
[facility] [place], [address]   COLONOSCOPY PROCEDURE REPORT     EXAM DATE: 10/28/2014  PATIENT NAME:      Regina, Sawyer           MR #:      [id number]  BIRTHDATE:       1955/06/09      VISIT #:     [phone]  ATTENDING:     Inda Castle, MD     STATUS:     outpatient ASSISTANT:      Cherylynn Ridges and Laverta [place]  INDICATIONS:  The patient is a 59 yr old female here for a colonoscopy due to weight loss. PROCEDURE PERFORMED:     Colonoscopy with cold biopsy polypectomy  MEDICATIONS:     Monitored anesthesia care ESTIMATED BLOOD LOSS:     None  CONSENT: The patient understands the risks and benefits of the procedure and understands that these risks include, but are not limited to: sedation, allergic reaction, infection, perforation and/or bleeding. Alternative means of evaluation and treatment include, among others: physical exam, x-rays, and/or surgical intervention. The patient elects to proceed with this endoscopic procedure.  DESCRIPTION OF PROCEDURE: During intra-op preparation period all mechanical & medical equipment was checked for proper function. Hand hygiene and appropriate measures for infection prevention was taken. After the risks, benefits and alternatives of the procedure were thoroughly explained, Informed consent was verified, confirmed and timeout was successfully executed by the treatment team. A digital exam revealed no abnormalities of the rectum. The Pentax Ped Colon [id number] endoscope was introduced through the anus and advanced to the terminal ileum which was intubated for a short distance. (Suprep was used) excellent. The instrument was then slowly withdrawn as the colon was fully examined.Estimated blood loss is zero unless otherwise noted in this procedure report.   COLON FINDINGS: A flat polyp measuring 2 mm in size was found in the ascending colon.  A polypectomy was performed with cold  forceps. A sessile polyp measuring 2 mm in size was found in the ascending colon.  A polypectomy was performed with cold forceps.   The examination was otherwise normal. Retroflexed views revealed no abnormalities. The scope was then completely withdrawn from the patient and the procedure terminated. SCOPE WITHDRAWAL TIME: 10:56    ADVERSE EVENTS:      There were no immediate complications.  IMPRESSIONS:     1.  Flat polyp was found in the ascending colon; polypectomy was performed with cold forceps 2.  Sessile polyp was found in the ascending colon; polypectomy was performed with cold forceps 3.  The examination was otherwise normal  RECOMMENDATIONS:     If the polyp(s) removed today are proven to be adenomatous (pre-cancerous) polyps, you will need a repeat colonoscopy in 5 years.  Otherwise you should continue to follow colorectal cancer screening guidelines for "routine risk" patients with colonoscopy in 10 years.  You will receive a letter within 1-2 weeks with the results of your biopsy as well as final recommendations.  Please call my office if you have not received a letter after 3 weeks. RECALL:  _____________________________ Inda Castle, MD eSigned:  Inda Castle, MD 10/28/2014 11:06 AM   cc:  Chrisandra Netters, MD   CPT CODES: ICD CODES:  The ICD and CPT codes recommended by this software are interpretations from the data that the clinical staff has captured with the software.  The verification of the translation of this report to  the ICD and CPT codes and modifiers is the sole responsibility of the health care institution and practicing physician where this report was generated.  [place]. will not be held responsible for the validity of the ICD and CPT codes included on this report.  AMA assumes no liability for data contained or not contained herein. CPT is a Designer, television/film set of the [employer].   PATIENT  NAME:  Regina, Sawyer MR#: [id number]

## 2014-10-28 NOTE — Transfer of Care (Signed)
Immediate Anesthesia Transfer of Care Note  Patient: Regina Sawyer  Procedure(s) Performed: Procedure(s): COLONOSCOPY (N/A)  Patient Location: Endoscopy Unit  Anesthesia Type:MAC  Level of Consciousness: awake, alert  and oriented  Airway & Oxygen Therapy: Patient Spontanous Breathing  Post-op Assessment: Report given to RN and Post -op Vital signs reviewed and stable  Post vital signs: Reviewed and stable  Last Vitals:  Filed Vitals:   10/28/14 0945  BP: 133/71  Pulse: 84  Temp: 37 C  Resp: 16    Complications: No apparent anesthesia complications

## 2014-10-29 ENCOUNTER — Encounter (HOSPITAL_COMMUNITY): Payer: Self-pay | Admitting: Gastroenterology

## 2014-10-30 ENCOUNTER — Ambulatory Visit: Payer: Medicare Other | Admitting: Physical Therapy

## 2014-10-30 DIAGNOSIS — M542 Cervicalgia: Secondary | ICD-10-CM | POA: Diagnosis not present

## 2014-10-30 DIAGNOSIS — R293 Abnormal posture: Secondary | ICD-10-CM | POA: Diagnosis not present

## 2014-10-30 DIAGNOSIS — M25519 Pain in unspecified shoulder: Secondary | ICD-10-CM | POA: Diagnosis not present

## 2014-10-30 DIAGNOSIS — M25612 Stiffness of left shoulder, not elsewhere classified: Secondary | ICD-10-CM

## 2014-10-30 DIAGNOSIS — M47812 Spondylosis without myelopathy or radiculopathy, cervical region: Secondary | ICD-10-CM | POA: Diagnosis not present

## 2014-10-30 NOTE — Patient Instructions (Addendum)
Over Head Pull: Narrow Grip ALSO WIDE GRIP.      On back, knees bent, feet flat, band across thighs, elbows straight but relaxed. Pull hands apart (start). Keeping elbows straight, bring arms up and over head, hands toward floor. Keep pull steady on band. Hold momentarily. Return slowly, keeping pull steady, back to start. Repeat 10-30___ times. Band colorYellow ______   Side Pull: Double Arm   On back, knees bent, feet flat. Arms perpendicular to body, shoulder level, elbows straight but relaxed. Pull arms out to sides, elbows straight. Resistance band comes across collarbones, hands toward floor. Hold momentarily. Slowly return to starting position. Repeat 10-30___ times. Band coloryellow _____   Sash   On back, knees bent, feet flat, left hand on left hip, right hand above left. Pull right arm DIAGONALLY (hip to shoulder) across chest. Bring right arm along head toward floor. Hold momentarily. Slowly return to starting position. Repeat _10-30__ times. Do with left arm. Band color _Yellow_____   Shoulder Rotation: Double Arm   On back, knees bent, feet flat, elbows tucked at sides, bent 90, hands palms up. Pull hands apart and down toward floor, keeping elbows near sides. Hold momentarily. Slowly return to starting position. Repeat _10-30__ times. Band color yellow______  AROM: Neck Rotation   Turn head slowly to look over one shoulder, then the other. Hold each position ____ seconds. Repeat _3-5___ times per set. Do 1____ sets per session. Do __1__ sessions per day.  http://orth.exer.us/294   Copyright  VHI. All rights reserved.  AROM: Lateral Neck Flexion   Slowly tilt head toward one shoulder, then the other. Hold each position __10__ seconds. Repeat _3-5___ times per set. Do __1__ sets per session. Do 1___ sessions per day.  http://orth.exer.us/296   Copyright  VHI. All rights reserved.  Extension   Hands behind neck, bend head back as far as is comfortable. Hold  _3-5___ seconds.Don't do if painful Repeat ____ times. Do ____ sessions per day.  Copyright  VHI. All rights reserved.  AROM: Neck Flexion   Bend head forward. Hold _10___ seconds. Repeat _3-5___ times per set. Do 1____ sets per session. Do _1___ sessions per day.  http://orth.exer.us/298   Copyright  VHI. All rights reserved.

## 2014-10-30 NOTE — Therapy (Signed)
[place], [place], [address] Phone: [phone]   Fax:  [phone]  Physical Therapy Treatment  Patient Details  Name: Regina Sawyer MRN: [id number] Date of Birth: March 05, 1956 Referring Provider:  Leeanne Rio, MD  Encounter Date: 10/30/2014      PT End of Session - 10/30/14 0842    Visit Number 3   Number of Visits 16   PT Start Time 0755   PT Stop Time 0842   PT Time Calculation (min) 47 min   Activity Tolerance Patient tolerated treatment well   Behavior During Therapy [facility] for tasks assessed/performed      Past Medical History  Diagnosis Date  . Hypertension   . GERD (gastroesophageal reflux disease)   . Heart murmur   . Ulcer   . Depression   . History of bacterial infection   . Sickle cell trait   . Tenderness of female pelvic organs 2005  . H/O vaginitis 2005  . Fibroid 2005  . BV (bacterial vaginosis) 2005  . Candida vaginitis 2006  . Libido, decreased 2006  . Female pelvic pain 2006  . Dysuria 2007  . Irregular periods/menstrual cycles 2007  . Menopausal symptoms 2007  . Hx: UTI (urinary tract infection) 2007  . H/O dyspareunia 2008  . Proteinuria 2009  . Nocturia 2009  . Candida vaginitis 2009  . Atrophic vaginitis 2009  . H/O constipation 2009  . History of hemorrhoids 2009  . Vaginal irritation 2011  . H/O urinary frequency 2011  . Asthma   . Hyperlipidemia     Past Surgical History  Procedure Laterality Date  . Carpal tunnel release      both wrists  . Tubal ligation    . Esophagogastroduodenoscopy N/A 08/06/2014    Procedure: ESOPHAGOGASTRODUODENOSCOPY (EGD);  Surgeon: Inda Castle, MD;  Location: [facility];  Service: Endoscopy;  Laterality: N/A;  . Colonoscopy N/A 10/28/2014    Procedure: COLONOSCOPY;  Surgeon: Inda Castle, MD;  Location: [facility];  Service: Endoscopy;  Laterality: N/A;    There were no vitals filed for this  visit.  Visit Diagnosis:  Abnormal posture  Stiffness of shoulder joint, left  Cervical spine degeneration      Subjective Assessment - 10/30/14 0755    Subjective No pain now.  Has intermittant spikes of pain when she moves a lot and lifting.  Toothach.  Worse with yard work.  Better with rest. Pain in Rt shoulder anterior/posterior.   Has tried to use god posture at home.  Handout is good.                         [place] Adult PT Treatment/Exercise - 10/30/14 0801    Shoulder Exercises: Supine   External Rotation Strengthening   Theraband Level (Shoulder External Rotation) Level 1 (Yellow)   Other Supine Exercises Supine band series :sash, ER diagonal ,narrow, wide grip flexion,  Horizontal abduction to chest 10 reps.  added series to home exercise   Shoulder Exercises: Pulleys   Flexion --  5 minutes scaption, flexion Rt   Neck Exercises: Stretches   Other Neck Stretches Neck AROM  flexion, side bends, rotation.  Attempted extension paitent did not want to do , even with the use of towel.  3-5 reps each.                PT Education - 10/30/14 0813    Education provided Yes  Education Details supine scap stabilization.   Person(s) Educated Patient   Methods Explanation;Demonstration;Tactile cues;Verbal cues;Handout   Comprehension Verbalized understanding;Returned demonstration          PT Short Term Goals - 10/30/14 0846    PT SHORT TERM GOAL #1   Title Pt will be I with HEP for cervical spine and Rt. UE   Time 4   Period Weeks   Status Unable to assess   PT SHORT TERM GOAL #2   Title Pt will be able to turn head and report min pain    Baseline No pain, pulling   Time 4   Period Weeks   Status Achieved   PT SHORT TERM GOAL #3   Title Pt will be able to reach to shoulder height without UE pain    Status Achieved           PT Long Term Goals - 10/30/14 0846    PT LONG TERM GOAL #3   Title Pt will be able to perform ADLs with min  difficulty/ pain   Time 8   Period Weeks   Status Achieved   PT LONG TERM GOAL #4   Title Pt will demofull AROM in C spine with min discomfort   Baseline See PLAN for measurements   Time 8   Period Weeks   Status On-going   PT LONG TERM GOAL #5   Title Pt will show functional gains by scoring <40% limited on FOTO.    Time 8   Period Weeks   Status Unable to assess               Plan - 10/30/14 0843    Clinical Impression Statement No pain post session.  Progress toward home ex and Neck ROM goals.  NECK AROM  Side bends RT 45, LT 35 , extension 55, Rotations 72 degrees both.  LGG#3 met.   PT Next Visit Plan check home ex   PT Home Exercise Plan Neck and posture ex with baqnds.   Consulted and Agree with Plan of Care Patient        Problem List Patient Active Problem List   Diagnosis Date Noted  . Benign neoplasm of ascending colon 10/28/2014  . Internal hemorrhoids 10/28/2014  . Vulvar lesion 10/07/2014  . Abdominal pain, epigastric 07/10/2014  . Dry mouth 07/10/2014  . Weight loss 06/04/2014  . Prediabetes 05/13/2014  . Elevated serum creatinine 05/12/2014  . Injury of left index finger 01/23/2014  . Arm pain, left 01/23/2014  . Cervical polyp 10/14/2013  . Vaginal itching 11/03/2012  . Cystitis 10/16/2012  . Hyperlipidemia 08/26/2009  . Anxiety and depression 07/30/2008  . VAGINITIS, ATROPHIC 05/08/2008  . POSTMENOPAUSAL BLEEDING 11/29/2007  . SYSTOLIC MURMUR 11/23/2007  . HYPERTENSION, BENIGN ESSENTIAL 02/05/2007  . ANEMIA, IRON DEFICIENCY, UNSPEC. 06/15/2006  . SICKLE CELL TRAIT 06/15/2006  . RHINITIS, ALLERGIC 06/15/2006  . GASTROESOPHAGEAL REFLUX, NO ESOPHAGITIS 06/15/2006    HARRIS,KAREN 10/30/2014, 8:48 AM  [facility] [address] [place], [place], [address] Phone: [phone]   Fax:  [phone]     Karen Harris, PTA 10/30/2014 8:48 AM Phone: [phone] Fax: [phone]  

## 2014-10-31 DIAGNOSIS — N771 Vaginitis, vulvitis and vulvovaginitis in diseases classified elsewhere: Secondary | ICD-10-CM | POA: Diagnosis not present

## 2014-10-31 DIAGNOSIS — N7689 Other specified inflammation of vagina and vulva: Secondary | ICD-10-CM | POA: Diagnosis not present

## 2014-11-03 ENCOUNTER — Ambulatory Visit: Payer: Medicare Other | Admitting: Physical Therapy

## 2014-11-03 DIAGNOSIS — R293 Abnormal posture: Secondary | ICD-10-CM

## 2014-11-03 DIAGNOSIS — M542 Cervicalgia: Secondary | ICD-10-CM | POA: Diagnosis not present

## 2014-11-03 DIAGNOSIS — M25612 Stiffness of left shoulder, not elsewhere classified: Secondary | ICD-10-CM

## 2014-11-03 DIAGNOSIS — M25519 Pain in unspecified shoulder: Secondary | ICD-10-CM | POA: Diagnosis not present

## 2014-11-03 DIAGNOSIS — M47812 Spondylosis without myelopathy or radiculopathy, cervical region: Secondary | ICD-10-CM

## 2014-11-03 NOTE — Therapy (Signed)
[place], [place], [address] Phone: [phone]   Fax:  [phone]  Physical Therapy Treatment  Patient Details  Name: Regina Sawyer MRN: [id number] Date of Birth: [birth year] Referring Provider:  Leeanne Rio, MD  Encounter Date: 11/03/2014      PT End of Session - 11/03/14 0927    Visit Number 4   Number of Visits 16   PT Start Time 0845   PT Stop Time 0926   PT Time Calculation (min) 41 min   Activity Tolerance Patient tolerated treatment well   Behavior During Therapy [facility] for tasks assessed/performed      Past Medical History  Diagnosis Date  . Hypertension   . GERD (gastroesophageal reflux disease)   . Heart murmur   . Ulcer   . Depression   . History of bacterial infection   . Sickle cell trait   . Tenderness of female pelvic organs 2005  . H/O vaginitis 2005  . Fibroid 2005  . BV (bacterial vaginosis) 2005  . Candida vaginitis 2006  . Libido, decreased 2006  . Female pelvic pain 2006  . Dysuria 2007  . Irregular periods/menstrual cycles 2007  . Menopausal symptoms 2007  . Hx: UTI (urinary tract infection) 2007  . H/O dyspareunia 2008  . Proteinuria 2009  . Nocturia 2009  . Candida vaginitis 2009  . Atrophic vaginitis 2009  . H/O constipation 2009  . History of hemorrhoids 2009  . Vaginal irritation 2011  . H/O urinary frequency 2011  . Asthma   . Hyperlipidemia     Past Surgical History  Procedure Laterality Date  . Carpal tunnel release      both wrists  . Tubal ligation    . Esophagogastroduodenoscopy N/A 08/06/2014    Procedure: ESOPHAGOGASTRODUODENOSCOPY (EGD);  Surgeon: Inda Castle, MD;  Location: [facility];  Service: Endoscopy;  Laterality: N/A;  . Colonoscopy N/A 10/28/2014    Procedure: COLONOSCOPY;  Surgeon: Inda Castle, MD;  Location: [facility];  Service: Endoscopy;  Laterality: N/A;    There were no vitals filed for this  visit.  Visit Diagnosis:  Abnormal posture  Stiffness of shoulder joint, left  Cervical spine degeneration      Subjective Assessment - 11/03/14 0855    Subjective Doing home exercises.  No pain.   Currently in Pain? No/denies                         [facility] Adult PT Treatment/Exercise - 11/03/14 0850    Neck Exercises: Supine   Capital Flexion Limitations deep neck flexor 10 reps 5 second holds.     Other Supine Exercise head press 10 reps 5 second holds cues initially.   Other Supine Exercise Supine scapular stabilization series with yellow band.  Narrow, wide grip 10 reps. small motions,  ER, horizontal pulls to collar   Shoulder Exercises: Pulleys   Other Pulley Exercises cable cross 10 LBS row 10 reps, 7 LBS IR/ER, Diagonal pull down, Punches bilateral, biceps 10 reps each.  cues for techniques and compensation.  Wobley trunk.  No pain                PT Education - 11/03/14 0922    Education provided Yes   Education Details Supine scapular stabilization   Person(s) Educated Patient   Methods Explanation   Comprehension Verbalized understanding          PT Short Term Goals -  10/30/14 0846    PT SHORT TERM GOAL #1   Title Pt will be I with HEP for cervical spine and Rt. UE   Time 4   Period Weeks   Status Unable to assess   PT SHORT TERM GOAL #2   Title Pt will be able to turn head and report min pain    Baseline No pain, pulling   Time 4   Period Weeks   Status Achieved   PT SHORT TERM GOAL #3   Title Pt will be able to reach to shoulder height without UE pain    Status Achieved           PT Long Term Goals - 10/30/14 0846    PT LONG TERM GOAL #3   Title Pt will be able to perform ADLs with min difficulty/ pain   Time 8   Period Weeks   Status Achieved   PT LONG TERM GOAL #4   Title Pt will demofull AROM in C spine with min discomfort   Baseline See PLAN for measurements   Time 8   Period Weeks   Status On-going   PT LONG  TERM GOAL #5   Title Pt will show functional gains by scoring <40% limited on FOTO.    Time 8   Period Weeks   Status Unable to assess               Plan - 11/03/14 1884    Clinical Impression Statement No pain post session.  Minimal pain with exerxise brief with flexion.  Fatigues quickly.  Progres toward pain goals.   PT Next Visit Plan Posture education.  Prone?   PT Home Exercise Plan continue bands for posture.   Consulted and Agree with Plan of Care Patient    Patient attempted ex on reformer but had to stop due to fear of heights.    Problem List Patient Active Problem List   Diagnosis Date Noted  . Benign neoplasm of ascending colon 10/28/2014  . Internal hemorrhoids 10/28/2014  . Vulvar lesion 10/07/2014  . Abdominal pain, epigastric 07/10/2014  . Dry mouth 07/10/2014  . Weight loss 06/04/2014  . Prediabetes 05/13/2014  . Elevated serum creatinine 05/12/2014  . Injury of left index finger 01/23/2014  . Arm pain, left 01/23/2014  . Cervical polyp 10/14/2013  . Vaginal itching 11/03/2012  . Cystitis 10/16/2012  . Hyperlipidemia 08/26/2009  . Anxiety and depression 07/30/2008  . VAGINITIS, ATROPHIC 05/08/2008  . POSTMENOPAUSAL BLEEDING 11/29/2007  . SYSTOLIC MURMUR 16/60/6301  . HYPERTENSION, BENIGN ESSENTIAL 02/05/2007  . ANEMIA, IRON DEFICIENCY, UNSPEC. 06/15/2006  . SICKLE CELL TRAIT 06/15/2006  . RHINITIS, ALLERGIC 06/15/2006  . GASTROESOPHAGEAL REFLUX, NO ESOPHAGITIS 06/15/2006    [facility] 11/03/2014, 9:30 AM  [facility] [address] [place], [place], [address] Phone: [phone]   Fax:  [phone]     Melvenia Needles, PTA 11/03/2014 9:30 AM Phone: [phone] Fax: [phone]

## 2014-11-03 NOTE — Patient Instructions (Signed)
Over Head Pull: Narrow Grip   Wide grip 10 reps, yellow band      On back, knees bent, feet flat, band across thighs, elbows straight but relaxed. Pull hands apart (start). Keeping elbows straight, bring arms up and over head, hands toward floor. Keep pull steady on band. Hold momentarily. Return slowly, keeping pull steady, back to start. Repeat _10__ times. Band coloryellow ______   Side Pull: Double Arm   On back, knees bent, feet flat. Arms perpendicular to body, shoulder level, elbows straight but relaxed. Pull arms out to sides, elbows straight. Resistance band comes across collarbones, hands toward floor. Hold momentarily. Slowly return to starting position. Repeat _10__ times. Band color _yellow____   Sash   On back, knees bent, feet flat, left hand on left hip, right hand above left. Pull right arm DIAGONALLY (hip to shoulder) across chest. Bring right arm along head toward floor. Hold momentarily. Slowly return to starting position. Repeat _10__ times. Do with left arm. Band color _yellow_____   Shoulder Rotation: Double Arm   On back, knees bent, feet flat, elbows tucked at sides, bent 90, hands palms up. Pull hands apart and down toward floor, keeping elbows near sides. Hold momentarily. Slowly return to starting position. Repeat _10__ times. Band color ___yellow___

## 2014-11-04 ENCOUNTER — Ambulatory Visit (INDEPENDENT_AMBULATORY_CARE_PROVIDER_SITE_OTHER): Payer: Medicare Other | Admitting: Family Medicine

## 2014-11-04 ENCOUNTER — Encounter: Payer: Self-pay | Admitting: Family Medicine

## 2014-11-04 VITALS — BP 138/66 | HR 70 | Temp 98.7°F | Ht 62.0 in | Wt 118.0 lb

## 2014-11-04 DIAGNOSIS — I10 Essential (primary) hypertension: Secondary | ICD-10-CM | POA: Diagnosis not present

## 2014-11-04 DIAGNOSIS — R010 Benign and innocent cardiac murmurs: Secondary | ICD-10-CM | POA: Diagnosis not present

## 2014-11-04 DIAGNOSIS — F418 Other specified anxiety disorders: Secondary | ICD-10-CM

## 2014-11-04 DIAGNOSIS — R011 Cardiac murmur, unspecified: Secondary | ICD-10-CM

## 2014-11-04 DIAGNOSIS — R634 Abnormal weight loss: Secondary | ICD-10-CM | POA: Diagnosis not present

## 2014-11-04 DIAGNOSIS — F419 Anxiety disorder, unspecified: Secondary | ICD-10-CM

## 2014-11-04 DIAGNOSIS — K219 Gastro-esophageal reflux disease without esophagitis: Secondary | ICD-10-CM | POA: Diagnosis not present

## 2014-11-04 DIAGNOSIS — F329 Major depressive disorder, single episode, unspecified: Secondary | ICD-10-CM

## 2014-11-04 LAB — T3: T3 TOTAL: 105.9 ng/dL (ref 80.0–204.0)

## 2014-11-04 LAB — TSH: TSH: 0.356 u[IU]/mL (ref 0.350–4.500)

## 2014-11-04 LAB — T4, FREE: FREE T4: 0.86 ng/dL (ref 0.80–1.80)

## 2014-11-04 NOTE — Addendum Note (Signed)
Addended by: Leeanne Rio on: 11/04/2014 03:40 PM   Modules accepted: Orders, Medications

## 2014-11-04 NOTE — Patient Instructions (Addendum)
It was great to see you again today!  Checking thyroid tests today I will see you back in one month Your weight is doing well  Be well, Dr. Ardelia Mems

## 2014-11-04 NOTE — Assessment & Plan Note (Signed)
Pt now declines being started on any anxiety medication. She seems similarly anxious to me, but thinks things are going better. Will check in with her about this at future visit.

## 2014-11-04 NOTE — Assessment & Plan Note (Signed)
Murmur auscultated on exam today. Reviewed echo results from 2009 (obtained due to murmur), which showed no explanation. Pt asymptomatic at this time (no CP, sob, etc) so I do not think she needs additional workup right now. Will continue to auscultate at future visits and pursue further workup if indicated.

## 2014-11-04 NOTE — Assessment & Plan Note (Signed)
Pt remains off HCTZ. BP control good today. Recommend continuing without antihypertensives for now.

## 2014-11-04 NOTE — Progress Notes (Addendum)
Patient ID: Regina Sawyer, female   DOB: 07-10-1955, 59 y.o.   MRN: [id number]  HPI:  F/u weight loss: pt reports she's feeling well. Endorses occasional acid reflux. Taking prevacid 30mg  daily. Had colonoscopy and she has not yet heard the results of her pathology (adenomatous polyps). No abdominal pain. Eating well. No diarrhea. Having BM about every other day, no blood in stool.  Anxiety: We talked about starting zoloft for anxiety at her last visit, and she now states she does not want this medicine. Feels less anxious. Denies SI/HI.   HTN - off HCTZ. Doing well. Murmur noted on exam today. Pt reports being told in the past that she has a murmur and had normal echo. Denies chest pain or SOB.  ROS: See HPI.  [place]: hx anemia, anxiety/depression, GERD, HLD, HTN, systolic murmur, atrophic vaginitis, weight loss  PHYSICAL EXAM: BP 138/66 mmHg  Pulse 70  Temp(Src) 98.7 F (37.1 C) (Oral)  Ht 5\' 2"  (1.575 m)  Wt 118 lb (53.524 kg)  BMI 21.58 kg/m2 Gen: NAD HEENT: NCAT Heart: slightly tachycardic (anxious), 2/6 systolic murmur loudest at LUSB Lungs: CTAB, NWOB Abd: soft, nontender to palpation, no masses or organomegaly Neuro: grossly nonfocal, speech normal Ext: atraumatic  ASSESSMENT/PLAN:  SYSTOLIC MURMUR Murmur auscultated on exam today. Reviewed echo results from 2009 (obtained due to murmur), which showed no explanation. Pt asymptomatic at this time (no CP, sob, etc) so I do not think she needs additional workup right now. Will continue to auscultate at future visits and pursue further workup if indicated.  Weight loss Colonoscopy with adenomatous polyp. Discussed results with patient today, and that GI physician will likely recommend repeat colonoscopy in 5 years, but that she await their official recommendation. Reassured her that she does NOT have cancer and that all of her tests thus far have been reassuring. Her weight is up slightly from last visit. Will check TSH, free  T4, and T3 today since we've not yet done this. F/u in 1 mo.  HYPERTENSION, BENIGN ESSENTIAL Pt remains off HCTZ. BP control good today. Recommend continuing without antihypertensives for now.  GASTROESOPHAGEAL REFLUX, NO ESOPHAGITIS Continue lansoprazole. Consider H2 blocker addition in the future since pt still reporting occasional reflux symptoms.  Anxiety and depression Pt now declines being started on any anxiety medication. She seems similarly anxious to me, but thinks things are going better. Will check in with her about this at future visit.   FOLLOW UP: F/u in 1 mo for weight recheck.  [place]. Ardelia Mems, [place]

## 2014-11-04 NOTE — Assessment & Plan Note (Signed)
Colonoscopy with adenomatous polyp. Discussed results with patient today, and that GI physician will likely recommend repeat colonoscopy in 5 years, but that she await their official recommendation. Reassured her that she does NOT have cancer and that all of her tests thus far have been reassuring. Her weight is up slightly from last visit. Will check TSH, free T4, and T3 today since we've not yet done this. F/u in 1 mo.

## 2014-11-04 NOTE — Assessment & Plan Note (Signed)
Continue lansoprazole. Consider H2 blocker addition in the future since pt still reporting occasional reflux symptoms.

## 2014-11-05 ENCOUNTER — Ambulatory Visit: Payer: Medicare Other | Admitting: Physical Therapy

## 2014-11-05 ENCOUNTER — Encounter: Payer: Self-pay | Admitting: Family Medicine

## 2014-11-05 DIAGNOSIS — R293 Abnormal posture: Secondary | ICD-10-CM | POA: Diagnosis not present

## 2014-11-05 DIAGNOSIS — M25612 Stiffness of left shoulder, not elsewhere classified: Secondary | ICD-10-CM | POA: Diagnosis not present

## 2014-11-05 DIAGNOSIS — M47812 Spondylosis without myelopathy or radiculopathy, cervical region: Secondary | ICD-10-CM | POA: Diagnosis not present

## 2014-11-05 DIAGNOSIS — M542 Cervicalgia: Secondary | ICD-10-CM | POA: Diagnosis not present

## 2014-11-05 DIAGNOSIS — M25519 Pain in unspecified shoulder: Secondary | ICD-10-CM

## 2014-11-05 NOTE — Therapy (Signed)
Regina Sawyer, [place], [address] Phone: [phone]   Fax:  [phone]  Physical Therapy Treatment  Patient Details  Name: Regina Sawyer MRN: [id number] Date of Birth: Aug 29, 1955 Referring Provider:  Leeanne Rio, MD  Encounter Date: 11/05/2014      PT End of Session - 11/05/14 1744    Visit Number 5   Number of Visits 16   PT Start Time 1500   PT Stop Time 1545   PT Time Calculation (min) 45 min   Activity Tolerance Patient tolerated treatment well   Behavior During Therapy [facility] for tasks assessed/performed      Past Medical History  Diagnosis Date  . Hypertension   . GERD (gastroesophageal reflux disease)   . Heart murmur   . Ulcer   . Depression   . History of bacterial infection   . Sickle cell trait   . Tenderness of female pelvic organs 2005  . H/O vaginitis 2005  . Fibroid 2005  . BV (bacterial vaginosis) 2005  . Candida vaginitis 2006  . Libido, decreased 2006  . Female pelvic pain 2006  . Dysuria 2007  . Irregular periods/menstrual cycles 2007  . Menopausal symptoms 2007  . Hx: UTI (urinary tract infection) 2007  . H/O dyspareunia 2008  . Proteinuria 2009  . Nocturia 2009  . Candida vaginitis 2009  . Atrophic vaginitis 2009  . H/O constipation 2009  . History of hemorrhoids 2009  . Vaginal irritation 2011  . H/O urinary frequency 2011  . Asthma   . Hyperlipidemia     Past Surgical History  Procedure Laterality Date  . Carpal tunnel release      both wrists  . Tubal ligation    . Esophagogastroduodenoscopy N/A 08/06/2014    Procedure: ESOPHAGOGASTRODUODENOSCOPY (EGD);  Surgeon: Inda Castle, MD;  Location: [facility];  Service: Endoscopy;  Laterality: N/A;  . Colonoscopy N/A 10/28/2014    Procedure: COLONOSCOPY;  Surgeon: Inda Castle, MD;  Location: [facility];  Service: Endoscopy;  Laterality: N/A;    There were no vitals filed for this  visit.  Visit Diagnosis:  Abnormal posture  Stiffness of shoulder joint, left  Cervical spine degeneration  Localized pain of shoulder joint  Neck and shoulder pain      Subjective Assessment - 11/05/14 1508    Subjective "I have no pain today and haven't had any pain for the last couple of days"   Currently in Pain? No/denies   Pain Score 0-No pain   Pain Orientation Right   Pain Frequency Intermittent                         [facility] Adult PT Treatment/Exercise - 11/05/14 0001    Neck Exercises: Machines for Strengthening   UBE (Upper Arm Bike) L 1 x 6 min  alt direction every 3 min   Neck Exercises: Supine   Neck Retraction 10 reps;5 secs  with chin tuck    Shoulder Exercises: Supine   Protraction AROM;Strengthening;Both;10 reps   Protraction Weight (lbs) 2   Horizontal ABduction Strengthening;Both;10 reps;Theraband   Theraband Level (Shoulder Horizontal ABduction) Level 2 (Red)   External Rotation Strengthening  with scapular retraction   Theraband Level (Shoulder External Rotation) Level 2 (Red)   Other Supine Exercises Supine band series :sash, ER diagonal ,narrow, wide grip flexion,  Horizontal abduction to chest 10 reps.  added series to home exercise   Other Supine  Exercises D2 PNF 2 x 10 with red theraband   Shoulder Exercises: Standing   External Rotation AROM;Both;10 reps;Theraband   Theraband Level (Shoulder External Rotation) Level 2 (Red)   Internal Rotation AROM;Strengthening;Both;10 reps   Theraband Level (Shoulder Internal Rotation) Level 2 (Red)   Extension Strengthening;Both;20 reps;Theraband   Theraband Level (Shoulder Extension) Level 3 (Green)   Row Strengthening;AROM;Both   Theraband Level (Shoulder Row) Level 3 (Green)   Neck Exercises: Stretches   Upper Trapezius Stretch 2 reps;30 seconds                PT Education - 11/05/14 1743    Education Details gave pt green theraband to progress strengthening exercises    Person(s) Educated Patient   Methods Explanation   Comprehension Verbalized understanding          PT Short Term Goals - 10/30/14 0846    PT SHORT TERM GOAL #1   Title Pt will be I with HEP for cervical spine and Rt. UE   Time 4   Period Weeks   Status Unable to assess   PT SHORT TERM GOAL #2   Title Pt will be able to turn head and report min pain    Baseline No pain, pulling   Time 4   Period Weeks   Status Achieved   PT SHORT TERM GOAL #3   Title Pt will be able to reach to shoulder height without UE pain    Status Achieved           PT Long Term Goals - 10/30/14 0846    PT LONG TERM GOAL #3   Title Pt will be able to perform ADLs with min difficulty/ pain   Time 8   Period Weeks   Status Achieved   PT LONG TERM GOAL #4   Title Pt will demofull AROM in C spine with min discomfort   Baseline See PLAN for measurements   Time 8   Period Weeks   Status On-going   PT LONG TERM GOAL #5   Title Pt will show functional gains by scoring <40% limited on FOTO.    Time 8   Period Weeks   Status Unable to assess               Plan - 11/05/14 1744    Clinical Impression Statement Regina Sawyer presents to therapy with no pain. She tolerated all exercises well without complaint. pt demonstrates impulsive behavior to go fast during exercises requiring repeated VC to slow down and demonstration on how to perform the exercise correctly. She reports she plans to keep the last two scheduled visit then self discharge.    PT Next Visit Plan Posture education, prone exercises if tolerated, shoulder/scapulat exercises   PT Home Exercise Plan increaed theraband resistance    Consulted and Agree with Plan of Care Patient        Problem List Patient Active Problem List   Diagnosis Date Noted  . Benign neoplasm of ascending colon 10/28/2014  . Internal hemorrhoids 10/28/2014  . Vulvar lesion 10/07/2014  . Abdominal pain, epigastric 07/10/2014  . Dry mouth 07/10/2014  .  Weight loss 06/04/2014  . Prediabetes 05/13/2014  . Elevated serum creatinine 05/12/2014  . Injury of left index finger 01/23/2014  . Arm pain, left 01/23/2014  . Cervical polyp 10/14/2013  . Vaginal itching 11/03/2012  . Cystitis 10/16/2012  . Hyperlipidemia 08/26/2009  . Anxiety and depression 07/30/2008  . VAGINITIS, ATROPHIC 05/08/2008  . POSTMENOPAUSAL  BLEEDING 11/29/2007  . SYSTOLIC MURMUR 62/26/3335  . HYPERTENSION, BENIGN ESSENTIAL 02/05/2007  . ANEMIA, IRON DEFICIENCY, UNSPEC. 06/15/2006  . SICKLE CELL TRAIT 06/15/2006  . RHINITIS, ALLERGIC 06/15/2006  . GASTROESOPHAGEAL REFLUX, NO ESOPHAGITIS 06/15/2006   Starr Lake PT, DPT, LAT, ATC  11/05/2014  5:47 PM    [place] [facility] [address] [place], [place], [address] Phone: [phone]   Fax:  [phone]

## 2014-11-07 ENCOUNTER — Telehealth: Payer: Self-pay | Admitting: Gastroenterology

## 2014-11-10 ENCOUNTER — Ambulatory Visit: Payer: Medicare Other | Admitting: Physical Therapy

## 2014-11-10 DIAGNOSIS — R293 Abnormal posture: Secondary | ICD-10-CM | POA: Diagnosis not present

## 2014-11-10 DIAGNOSIS — M25519 Pain in unspecified shoulder: Secondary | ICD-10-CM | POA: Diagnosis not present

## 2014-11-10 DIAGNOSIS — M542 Cervicalgia: Secondary | ICD-10-CM | POA: Diagnosis not present

## 2014-11-10 DIAGNOSIS — M25612 Stiffness of left shoulder, not elsewhere classified: Secondary | ICD-10-CM

## 2014-11-10 DIAGNOSIS — M47812 Spondylosis without myelopathy or radiculopathy, cervical region: Secondary | ICD-10-CM | POA: Diagnosis not present

## 2014-11-10 NOTE — Telephone Encounter (Signed)
I'm going to have GI research contact her regarding a new GERD study

## 2014-11-10 NOTE — Telephone Encounter (Signed)
Please advise 

## 2014-11-10 NOTE — Therapy (Signed)
[place], [place], [address] Phone: [phone]   Fax:  [phone]  Physical Therapy Treatment  Patient Details  Name: Regina Sawyer MRN: [id number] Date of Birth: September 27, 1955 Referring Provider:  Leeanne Rio, MD  Encounter Date: 11/10/2014      PT End of Session - 11/10/14 0926    Visit Number 6   Number of Visits 16   PT Start Time 0845   PT Stop Time 0928   PT Time Calculation (min) 43 min   Activity Tolerance Patient tolerated treatment well   Behavior During Therapy [facility] for tasks assessed/performed      Past Medical History  Diagnosis Date  . Hypertension   . GERD (gastroesophageal reflux disease)   . Heart murmur   . Ulcer   . Depression   . History of bacterial infection   . Sickle cell trait   . Tenderness of female pelvic organs 2005  . H/O vaginitis 2005  . Fibroid 2005  . BV (bacterial vaginosis) 2005  . Candida vaginitis 2006  . Libido, decreased 2006  . Female pelvic pain 2006  . Dysuria 2007  . Irregular periods/menstrual cycles 2007  . Menopausal symptoms 2007  . Hx: UTI (urinary tract infection) 2007  . H/O dyspareunia 2008  . Proteinuria 2009  . Nocturia 2009  . Candida vaginitis 2009  . Atrophic vaginitis 2009  . H/O constipation 2009  . History of hemorrhoids 2009  . Vaginal irritation 2011  . H/O urinary frequency 2011  . Asthma   . Hyperlipidemia     Past Surgical History  Procedure Laterality Date  . Carpal tunnel release      both wrists  . Tubal ligation    . Esophagogastroduodenoscopy N/A 08/06/2014    Procedure: ESOPHAGOGASTRODUODENOSCOPY (EGD);  Surgeon: Inda Castle, MD;  Location: [facility];  Service: Endoscopy;  Laterality: N/A;  . Colonoscopy N/A 10/28/2014    Procedure: COLONOSCOPY;  Surgeon: Inda Castle, MD;  Location: [facility];  Service: Endoscopy;  Laterality: N/A;    There were no vitals filed for this  visit.  Visit Diagnosis:  Abnormal posture  Stiffness of shoulder joint, left      Subjective Assessment - 11/10/14 0851    Subjective No pain.  Doing her exercises at home.   Currently in Pain? No/denies                         [facility] Adult PT Treatment/Exercise - 11/10/14 0858    Neck Exercises: Machines for Strengthening   UBE (Upper Arm Bike) L1 3 minutes each direction.  reminded of posture X1   Other Machines for Strengthening Cable cross 3 LBs IR/ER, cues,    Shoulder Exercises: Supine   Horizontal ABduction 10 reps   Theraband Level (Shoulder Horizontal ABduction) Level 3 (Green)   External Rotation 10 reps   Theraband Level (Shoulder External Rotation) Level 3 (Green)   Flexion 10 reps  2 sets narrow and wide hands.   Theraband Level (Shoulder Flexion) Level 3 (Green)   Other Supine Exercises D2 PNF 2 x 10 with green theraband   Shoulder Exercises: Prone   Other Prone Exercises [place] "T" "Y"'s with thumbs forward and out 10 reps each..  Small movements with Y's.  cued for techniques.   Shoulder Exercises: Standing   Extension 10 reps   Theraband Level (Shoulder Extension) Level 1 (Yellow)  both   Row 10 reps  Theraband Level (Shoulder Row) Level 3 (Green)   Other Standing Exercises wall push up 10 reps   Shoulder Exercises: Pulleys   Flexion --  gentle stretch, 5 minutes.                PT Education - 11/10/14 [phone]    Education provided Yes   Education Details prone T, Y 's issued from ex drawer   Person(s) Educated Patient   Methods Explanation;Demonstration;Tactile cues;Verbal cues;Handout   Comprehension Verbalized understanding;Returned demonstration;Tactile cues required          PT Short Term Goals - 10/30/14 0846    PT SHORT TERM GOAL #1   Title Pt will be I with HEP for cervical spine and Rt. UE   Time 4   Period Weeks   Status Unable to assess   PT SHORT TERM GOAL #2   Title Pt will be able to turn head and  report min pain    Baseline No pain, pulling   Time 4   Period Weeks   Status Achieved   PT SHORT TERM GOAL #3   Title Pt will be able to reach to shoulder height without UE pain    Status Achieved           PT Long Term Goals - 10/30/14 0846    PT LONG TERM GOAL #3   Title Pt will be able to perform ADLs with min difficulty/ pain   Time 8   Period Weeks   Status Achieved   PT LONG TERM GOAL #4   Title Pt will demofull AROM in C spine with min discomfort   Baseline See PLAN for measurements   Time 8   Period Weeks   Status On-going   PT LONG TERM GOAL #5   Title Pt will show functional gains by scoring <40% limited on FOTO.    Time 8   Period Weeks   Status Unable to assess               Plan - 11/10/14 0927    Clinical Impression Statement Strengthening focus.  Prone ex added to home program.     I more visit then D/C. Per PT.   Problem List Patient Active Problem List   Diagnosis Date Noted  . Benign neoplasm of ascending colon 10/28/2014  . Internal hemorrhoids 10/28/2014  . Vulvar lesion 10/07/2014  . Abdominal pain, epigastric 07/10/2014  . Dry mouth 07/10/2014  . Weight loss 06/04/2014  . Prediabetes 05/13/2014  . Elevated serum creatinine 05/12/2014  . Injury of left index finger 01/23/2014  . Arm pain, left 01/23/2014  . Cervical polyp 10/14/2013  . Vaginal itching 11/03/2012  . Cystitis 10/16/2012  . Hyperlipidemia 08/26/2009  . Anxiety and depression 07/30/2008  . VAGINITIS, ATROPHIC 05/08/2008  . POSTMENOPAUSAL BLEEDING 11/29/2007  . SYSTOLIC MURMUR 21/22/4825  . HYPERTENSION, BENIGN ESSENTIAL 02/05/2007  . ANEMIA, IRON DEFICIENCY, UNSPEC. 06/15/2006  . SICKLE CELL TRAIT 06/15/2006  . RHINITIS, ALLERGIC 06/15/2006  . GASTROESOPHAGEAL REFLUX, NO ESOPHAGITIS 06/15/2006    [facility] 11/10/2014, 9:29 AM  [facility] [address] [place], [place], [address] Phone: [phone]    Fax:  [phone]     Melvenia Needles, PTA 11/10/2014 9:29 AM Phone: [phone] Fax: [phone]

## 2014-11-12 ENCOUNTER — Ambulatory Visit: Payer: Medicare Other | Admitting: Physical Therapy

## 2014-11-12 DIAGNOSIS — M25612 Stiffness of left shoulder, not elsewhere classified: Secondary | ICD-10-CM

## 2014-11-12 DIAGNOSIS — R293 Abnormal posture: Secondary | ICD-10-CM | POA: Diagnosis not present

## 2014-11-12 DIAGNOSIS — M25519 Pain in unspecified shoulder: Secondary | ICD-10-CM | POA: Diagnosis not present

## 2014-11-12 DIAGNOSIS — M542 Cervicalgia: Secondary | ICD-10-CM | POA: Diagnosis not present

## 2014-11-12 DIAGNOSIS — M47812 Spondylosis without myelopathy or radiculopathy, cervical region: Secondary | ICD-10-CM | POA: Diagnosis not present

## 2014-11-12 NOTE — Therapy (Addendum)
[place], [place], [address] Phone: [phone]   Fax:  [phone]  Physical Therapy Treatment  Patient Details  Name: Regina Sawyer MRN: [id number] Date of Birth: 1955/05/12 Referring Provider:  Leeanne Rio, MD  Encounter Date: 11/12/2014      PT End of Session - 11/12/14 1220    Visit Number 8   Number of Visits 16   PT Start Time 3151   PT Stop Time 0935   PT Time Calculation (min) 48 min   Activity Tolerance Patient tolerated treatment well      Past Medical History  Diagnosis Date  . Hypertension   . GERD (gastroesophageal reflux disease)   . Heart murmur   . Ulcer   . Depression   . History of bacterial infection   . Sickle cell trait   . Tenderness of female pelvic organs 2005  . H/O vaginitis 2005  . Fibroid 2005  . BV (bacterial vaginosis) 2005  . Candida vaginitis 2006  . Libido, decreased 2006  . Female pelvic pain 2006  . Dysuria 2007  . Irregular periods/menstrual cycles 2007  . Menopausal symptoms 2007  . Hx: UTI (urinary tract infection) 2007  . H/O dyspareunia 2008  . Proteinuria 2009  . Nocturia 2009  . Candida vaginitis 2009  . Atrophic vaginitis 2009  . H/O constipation 2009  . History of hemorrhoids 2009  . Vaginal irritation 2011  . H/O urinary frequency 2011  . Asthma   . Hyperlipidemia     Past Surgical History  Procedure Laterality Date  . Carpal tunnel release      both wrists  . Tubal ligation    . Esophagogastroduodenoscopy N/A 08/06/2014    Procedure: ESOPHAGOGASTRODUODENOSCOPY (EGD);  Surgeon: Inda Castle, MD;  Location: [facility];  Service: Endoscopy;  Laterality: N/A;  . Colonoscopy N/A 10/28/2014    Procedure: COLONOSCOPY;  Surgeon: Inda Castle, MD;  [facility]: [facility];  Service: Endoscopy;  Laterality: N/A;    There were no vitals filed for this visit.  Visit Diagnosis:  Stiffness of shoulder joint, left  Abnormal  posture      Subjective Assessment - 11/12/14 0857    Subjective 0 pain.  Feels like she wants to get stronger.   Currently in Pain? No/denies   Multiple Pain Sites No                         [facility] Adult PT Treatment/Exercise - 11/12/14 0858    Neck Exercises: Machines for Strengthening   Other Machines for Strengthening Lat pull down 1.5 plates.  2 o reps, 2 plates 10 reps   Other Machines for Strengthening cable cross punch, row,IR/ER, biceps    Shoulder Exercises: Prone   Flexion AROM;Right;10 reps  thumb out, forward "Y"   Horizontal ABduction 1 AROM;Right;15 reps  guided scapula weak.   Shoulder Exercises: Sidelying   External Rotation Strengthening;Right;15 reps;Weights  5 LBS   Flexion AROM;Right;15 reps   ABduction AROM;Right;10 reps   Shoulder Exercises: Standing   Other Standing Exercises wall push ups 20 reps   Shoulder Exercises: Pulleys   Flexion --  5 minutes                PT Education - 11/12/14 1220    Education provided No          PT Short Term Goals - 11/12/14 1226    PT SHORT TERM GOAL #  1   Title Pt will be I with HEP for cervical spine and Rt. UE   Baseline able to demonstrate understanding.   Time 4   Period Weeks   Status Achieved   PT SHORT TERM GOAL #2   Title Pt will be able to turn head and report min pain    Baseline No pain, pulling   Time 4   Period Weeks   Status Achieved   PT SHORT TERM GOAL #3   Title Pt will be able to reach to shoulder height without UE pain    Baseline no pain with this   Time 4   Period Weeks   Status Achieved           PT Long Term Goals - 11/12/14 1227    PT LONG TERM GOAL #1   Title Patient will be I with advanced HEP for posture and strength   Time 8   Period Weeks   Status On-going   PT LONG TERM GOAL #2   Title Pt will demo normal strength (4+/5 or more)  in bilateral UE without pain    Time 4   Period Weeks   Status Unable to assess   PT LONG TERM GOAL #3    Title Pt will be able to perform ADLs with min difficulty/ pain   Time 8   Period Weeks   Status Achieved   PT LONG TERM GOAL #4   Title Pt will demofull AROM in C spine with min discomfort   Time 8   Period Weeks   Status Unable to assess   PT LONG TERM GOAL #5   Title Pt will show functional gains by scoring <40% limited on FOTO.    Time 8   Period Weeks   Status Unable to assess               Plan - 11/12/14 1221    Clinical Impression Statement Patient works hard during session.  Able to advance reps. Patient pain much improved, Scapular movement deviant. RT.  STG #1 met.        Problem List Patient Active Problem List   Diagnosis Date Noted  . Benign neoplasm of ascending colon 10/28/2014  . Internal hemorrhoids 10/28/2014  . Vulvar lesion 10/07/2014  . Abdominal pain, epigastric 07/10/2014  . Dry mouth 07/10/2014  . Weight loss 06/04/2014  . Prediabetes 05/13/2014  . Elevated serum creatinine 05/12/2014  . Injury of left index finger 01/23/2014  . Arm pain, left 01/23/2014  . Cervical polyp 10/14/2013  . Vaginal itching 11/03/2012  . Cystitis 10/16/2012  . Hyperlipidemia 08/26/2009  . Anxiety and depression 07/30/2008  . VAGINITIS, ATROPHIC 05/08/2008  . POSTMENOPAUSAL BLEEDING 11/29/2007  . SYSTOLIC MURMUR 17/00/1749  . HYPERTENSION, BENIGN ESSENTIAL 02/05/2007  . ANEMIA, IRON DEFICIENCY, UNSPEC. 06/15/2006  . SICKLE CELL TRAIT 06/15/2006  . RHINITIS, ALLERGIC 06/15/2006  . GASTROESOPHAGEAL REFLUX, NO ESOPHAGITIS 06/15/2006    [facility] 11/12/2014, 12:33 PM  [facility] [address] [place], [place], [address] Phone: [phone]   Fax:  [phone]     Melvenia Needles, PTA 11/12/2014 12:33 PM Phone: [phone] Fax: [phone]

## 2014-11-13 NOTE — Telephone Encounter (Signed)
noted 

## 2014-11-18 DIAGNOSIS — Z1231 Encounter for screening mammogram for malignant neoplasm of breast: Secondary | ICD-10-CM | POA: Diagnosis not present

## 2014-11-19 DIAGNOSIS — R350 Frequency of micturition: Secondary | ICD-10-CM | POA: Diagnosis not present

## 2014-11-19 DIAGNOSIS — L293 Anogenital pruritus, unspecified: Secondary | ICD-10-CM | POA: Diagnosis not present

## 2014-11-20 ENCOUNTER — Telehealth: Payer: Self-pay | Admitting: Gastroenterology

## 2014-11-20 NOTE — Telephone Encounter (Signed)
She is taking her Prevacid in the mornings like she supposed to. She is okay until afternoon and evenings. She begins having lots of belching and burping. Not refluxing. She has tried Gas-X and OTC H2 blocker (tagamet). Her symptoms do not improve with either of these. She is not yet in the GERD study. Please advise.

## 2014-11-21 ENCOUNTER — Encounter: Payer: Self-pay | Admitting: *Deleted

## 2014-11-21 ENCOUNTER — Other Ambulatory Visit: Payer: Self-pay

## 2014-11-21 MED ORDER — DEXLANSOPRAZOLE 60 MG PO CPDR: 60.0000 mg | DELAYED_RELEASE_CAPSULE | Freq: Every day | ORAL | Status: DC

## 2014-11-21 NOTE — Telephone Encounter (Signed)
Is she starting the GERD study?  If not change to dexilant 60mg  qam. If she is refer to GI research

## 2014-11-21 NOTE — Telephone Encounter (Signed)
Patient is not involved with any medical studies presently. Agrees to try Dexilant. Rx sent to the [facility] at [facility]. Patient encouraged to call the pharmacy before going to pick up the medication in case it required a PA from her insurance.

## 2014-12-01 ENCOUNTER — Ambulatory Visit: Payer: Medicare Other | Attending: Family Medicine | Admitting: Physical Therapy

## 2014-12-01 DIAGNOSIS — M542 Cervicalgia: Secondary | ICD-10-CM | POA: Insufficient documentation

## 2014-12-01 DIAGNOSIS — M47812 Spondylosis without myelopathy or radiculopathy, cervical region: Secondary | ICD-10-CM | POA: Insufficient documentation

## 2014-12-01 DIAGNOSIS — M25612 Stiffness of left shoulder, not elsewhere classified: Secondary | ICD-10-CM | POA: Insufficient documentation

## 2014-12-01 DIAGNOSIS — R293 Abnormal posture: Secondary | ICD-10-CM | POA: Insufficient documentation

## 2014-12-01 DIAGNOSIS — M25519 Pain in unspecified shoulder: Secondary | ICD-10-CM | POA: Insufficient documentation

## 2014-12-04 ENCOUNTER — Emergency Department (INDEPENDENT_AMBULATORY_CARE_PROVIDER_SITE_OTHER)
Admission: EM | Admit: 2014-12-04 | Discharge: 2014-12-04 | Disposition: A | Discharge status: Home / Self Care | Payer: Medicare Other | Source: Home / Self Care | Attending: Family Medicine | Admitting: Family Medicine

## 2014-12-04 ENCOUNTER — Encounter (HOSPITAL_COMMUNITY): Payer: Self-pay | Admitting: *Deleted

## 2014-12-04 ENCOUNTER — Emergency Department (INDEPENDENT_AMBULATORY_CARE_PROVIDER_SITE_OTHER): Payer: Medicare Other

## 2014-12-04 DIAGNOSIS — S9031XA Contusion of right foot, initial encounter: Secondary | ICD-10-CM

## 2014-12-04 DIAGNOSIS — Z6820 Body mass index (BMI) 20.0-20.9, adult: Secondary | ICD-10-CM | POA: Diagnosis not present

## 2014-12-04 DIAGNOSIS — S99921A Unspecified injury of right foot, initial encounter: Secondary | ICD-10-CM | POA: Diagnosis not present

## 2014-12-04 DIAGNOSIS — Z01419 Encounter for gynecological examination (general) (routine) without abnormal findings: Secondary | ICD-10-CM | POA: Diagnosis not present

## 2014-12-04 DIAGNOSIS — Z124 Encounter for screening for malignant neoplasm of cervix: Secondary | ICD-10-CM | POA: Diagnosis not present

## 2014-12-04 DIAGNOSIS — M795 Residual foreign body in soft tissue: Secondary | ICD-10-CM | POA: Diagnosis not present

## 2014-12-04 MED ORDER — TRAMADOL HCL 50 MG PO TABS: 50.0000 mg | ORAL_TABLET | Freq: Four times a day (QID) | ORAL | Status: DC | PRN

## 2014-12-04 MED ORDER — TETANUS-DIPHTH-ACELL PERTUSSIS 5-2.5-18.5 LF-MCG/0.5 IM SUSP
INTRAMUSCULAR | Status: AC
  Filled 2014-12-04: qty 0.5

## 2014-12-04 MED ORDER — TETANUS-DIPHTH-ACELL PERTUSSIS 5-2.5-18.5 LF-MCG/0.5 IM SUSP
0.5000 mL | Freq: Once | INTRAMUSCULAR | Status: AC
  Administered 2014-12-04: 0.5 mL via INTRAMUSCULAR

## 2014-12-04 MED ORDER — BACITRACIN ZINC 500 UNIT/GM EX OINT
TOPICAL_OINTMENT | CUTANEOUS | Status: AC
  Filled 2014-12-04: qty 0.9

## 2014-12-04 NOTE — ED Notes (Signed)
Pt  Reports      She  Dropped    A   Heavy  Stick            On her  r  Foot         She  Has  A  Break in  Skin  And  Some  Swelling and  Pain on  Weight  Bearing      Happened  yesterday

## 2014-12-04 NOTE — Discharge Instructions (Signed)
Ice for swelling, advil for pain and wear shoe for swelling.

## 2014-12-04 NOTE — ED Provider Notes (Signed)
CSN: [id number]     Arrival date & time 12/04/14  1913 History   None    Chief Complaint  Patient presents with  . Foot Pain   (Consider location/radiation/quality/duration/timing/severity/associated sxs/prior Treatment) Patient is a 59 y.o. female presenting with lower extremity pain. The history is provided by the patient.  Foot Pain This is a new problem. The current episode started yesterday. The problem has not changed since onset.The symptoms are aggravated by walking.    Past Medical History  Diagnosis Date  . Hypertension   . GERD (gastroesophageal reflux disease)   . Heart murmur   . Ulcer   . Depression   . History of bacterial infection   . Sickle cell trait   . Tenderness of female pelvic organs 2005  . H/O vaginitis 2005  . Fibroid 2005  . BV (bacterial vaginosis) 2005  . Candida vaginitis 2006  . Libido, decreased 2006  . Female pelvic pain 2006  . Dysuria 2007  . Irregular periods/menstrual cycles 2007  . Menopausal symptoms 2007  . Hx: UTI (urinary tract infection) 2007  . H/O dyspareunia 2008  . Proteinuria 2009  . Nocturia 2009  . Candida vaginitis 2009  . Atrophic vaginitis 2009  . H/O constipation 2009  . History of hemorrhoids 2009  . Vaginal irritation 2011  . H/O urinary frequency 2011  . Asthma   . Hyperlipidemia    Past Surgical History  Procedure Laterality Date  . Carpal tunnel release      both wrists  . Tubal ligation    . Esophagogastroduodenoscopy N/A 08/06/2014    Procedure: ESOPHAGOGASTRODUODENOSCOPY (EGD);  Surgeon: Inda Castle, MD;  Location: [facility];  Service: Endoscopy;  Laterality: N/A;  . Colonoscopy N/A 10/28/2014    Procedure: COLONOSCOPY;  Surgeon: Inda Castle, MD;  [facility]: [facility];  Service: Endoscopy;  Laterality: N/A;   Family History  Problem Relation Age of Onset  . Diabetes Sister   . Hypertension Sister   . Diabetes Other   . Colon cancer Father   . Colon polyps Neg Hx   . Kidney disease  Neg Hx   . Esophageal cancer Neg Hx   . Heart failure Brother     x2  . Gallbladder disease Neg Hx   . Gallstones Mother    Social History  Substance Use Topics  . Smoking status: Passive Smoke Exposure - Never Smoker  . Smokeless tobacco: Never Used  . Alcohol Use: No   OB History    Gravida Para Term Preterm AB TAB SAB Ectopic Multiple Living   2 2 2  0 0 0 0 0 0 2     Review of Systems  Constitutional: Negative.   Musculoskeletal: Positive for joint swelling and gait problem.  Skin: Positive for wound.    Allergies  Atorvastatin and Latex  Home Medications   Prior to Admission medications   Medication Sig Start Date End Date Taking? Authorizing Provider  Chlorpheniramine Maleate (ALLERGY PO) Take 1 tablet by mouth daily as needed (allergies.).    Historical Provider, MD  dexlansoprazole (DEXILANT) 60 MG capsule Take 1 capsule (60 mg total) by mouth daily. 11/21/14   Inda Castle, MD  hydrocortisone cream 0.5 % Apply 1 application topically 2 (two) times daily. Patient not taking: Reported on 09/29/2014 08/01/14   Leeanne Rio, MD  traMADol (ULTRAM) 50 MG tablet Take 1 tablet (50 mg total) by mouth every 6 (six) hours as needed. 12/04/14   Billy Fischer,  MD   BP 140/75 mmHg  Pulse 90  Temp(Src) 99 F (37.2 C) (Oral)  Resp 20  SpO2 99% Physical Exam  Constitutional: She is oriented to person, place, and time. She appears well-developed and well-nourished.  Musculoskeletal: She exhibits edema and tenderness.       Feet:  Neurological: She is alert and oriented to person, place, and time.  Skin: Skin is warm and dry.  Nursing note and vitals reviewed.   ED Course  Procedures (including critical care time) Labs Review Labs Reviewed - No data to display  Imaging Review Dg Foot Complete Right  12/04/2014   CLINICAL DATA:  Piece of wood fell on foot yesterday  EXAM: RIGHT FOOT COMPLETE - 3+ VIEW  COMPARISON:  None.  FINDINGS: Frontal, oblique, and lateral  views obtained. On the lateral view, there is a small linear radiopaque foreign body in the superficial soft tissues dorsally. No other radiopaque foreign body identified. No fracture or dislocation. Joint spaces appear intact. No erosive change.  IMPRESSION: Small linear radiopaque foreign body in the superficial soft tissues dorsally, seen only on the lateral view. No fracture or dislocation. No appreciable arthropathic change.   Electronically Signed   By: Lowella Grip III M.D.   On: 12/04/2014 20:15   X-rays reviewed and report per radiologist.   MDM   1. Foot contusion, right, initial encounter        Billy Fischer, MD 12/05/14 2037

## 2014-12-05 ENCOUNTER — Ambulatory Visit: Payer: Medicare Other | Admitting: Physical Therapy

## 2014-12-05 DIAGNOSIS — M542 Cervicalgia: Secondary | ICD-10-CM

## 2014-12-05 DIAGNOSIS — M47812 Spondylosis without myelopathy or radiculopathy, cervical region: Secondary | ICD-10-CM | POA: Diagnosis not present

## 2014-12-05 DIAGNOSIS — M25519 Pain in unspecified shoulder: Secondary | ICD-10-CM | POA: Diagnosis not present

## 2014-12-05 DIAGNOSIS — M25612 Stiffness of left shoulder, not elsewhere classified: Secondary | ICD-10-CM

## 2014-12-05 DIAGNOSIS — R293 Abnormal posture: Secondary | ICD-10-CM

## 2014-12-05 NOTE — Therapy (Signed)
[place], [place], [address] Phone: [phone]   Fax:  [phone]  Physical Therapy Treatment/Renewal/Discharge  Patient Details  Name: Regina Sawyer MRN: [id number] Date of Birth: 1955/10/27 Referring Provider:  Leeanne Rio, MD  Encounter Date: 12/05/2014      PT End of Session - 12/05/14 1884    Visit Number 9   Number of Visits 16   PT Start Time 0805   Activity Tolerance Patient tolerated treatment well   Behavior During Therapy [facility] for tasks assessed/performed      Past Medical History  Diagnosis Date  . Hypertension   . GERD (gastroesophageal reflux disease)   . Heart murmur   . Ulcer   . Depression   . History of bacterial infection   . Sickle cell trait   . Tenderness of female pelvic organs 2005  . H/O vaginitis 2005  . Fibroid 2005  . BV (bacterial vaginosis) 2005  . Candida vaginitis 2006  . Libido, decreased 2006  . Female pelvic pain 2006  . Dysuria 2007  . Irregular periods/menstrual cycles 2007  . Menopausal symptoms 2007  . Hx: UTI (urinary tract infection) 2007  . H/O dyspareunia 2008  . Proteinuria 2009  . Nocturia 2009  . Candida vaginitis 2009  . Atrophic vaginitis 2009  . H/O constipation 2009  . History of hemorrhoids 2009  . Vaginal irritation 2011  . H/O urinary frequency 2011  . Asthma   . Hyperlipidemia     Past Surgical History  Procedure Laterality Date  . Carpal tunnel release      both wrists  . Tubal ligation    . Esophagogastroduodenoscopy N/A 08/06/2014    Procedure: ESOPHAGOGASTRODUODENOSCOPY (EGD);  Surgeon: Inda Castle, MD;  Location: [facility];  Service: Endoscopy;  Laterality: N/A;  . Colonoscopy N/A 10/28/2014    Procedure: COLONOSCOPY;  Surgeon: Inda Castle, MD;  [facility]: [facility];  Service: Endoscopy;  Laterality: N/A;    There were no vitals filed for this visit.  Visit Diagnosis:  Stiffness of shoulder  joint, left  Abnormal posture  Cervical spine degeneration  Localized pain of shoulder joint  Neck and shoulder pain      Subjective Assessment - 12/05/14 0806    Subjective Shoulder is fine.  My arm is sore from my tetanus shot.  My husband was working and he dropped a board on my foot.  Pt wearing a boot.    Currently in Pain? No/denies            [facility] PT Assessment - 12/05/14 0805    AROM   Right Shoulder Flexion 148 Degrees   Right Shoulder ABduction 138 Degrees   Left Shoulder Flexion 150 Degrees   Left Shoulder ABduction 132 Degrees   Cervical Flexion 53   Cervical Extension 32   Cervical - Right Side Bend 42   Cervical - Left Side Bend 45   Cervical - Right Rotation WNL   Cervical - Left Rotation WNL   Strength   Right Shoulder Flexion 5/5   Right Shoulder ABduction 5/5   Left Shoulder Flexion 5/5   Left Shoulder ABduction 5/5                             PT Education - 12/05/14 0821    Education provided Yes   Education Details full HEP review   Person(s) Educated Patient   Methods Explanation;Demonstration  Comprehension Verbalized understanding;Returned demonstration          PT Short Term Goals - 2014/12/25 0827    PT SHORT TERM GOAL #1   Title Pt will be I with HEP for cervical spine and Rt. UE   Status Achieved   PT SHORT TERM GOAL #2   Title Pt will be able to turn head and report min pain    Status Achieved   PT SHORT TERM GOAL #3   Title Pt will be able to reach to shoulder height without UE pain    Status Achieved           PT Long Term Goals - 2014/12/25 0827    PT LONG TERM GOAL #1   Title Patient will be I with advanced HEP for posture and strength   Status Achieved   PT LONG TERM GOAL #2   Title Pt will demo normal strength (4+/5 or more)  in bilateral UE without pain    Status Achieved   PT LONG TERM GOAL #3   Title Pt will be able to perform ADLs with min difficulty/ pain   Status Achieved   PT LONG  TERM GOAL #4   Title Pt will demofull AROM in C spine with min discomfort   Status Achieved   PT LONG TERM GOAL #5   Title Pt will show functional gains by scoring <40% limited on FOTO.    Status Achieved               Plan - 12/25/14 0826    Clinical Impression Statement Patient has met all goals and is having no pain at this time (sore from tetanus).  DC to HEP. Scored 0% limited on FOTO!   PT Next Visit Plan NA   PT Home Exercise Plan has full HEP with red band   Consulted and Agree with Plan of Care Patient          G-Codes - 2014/12/25 0841    Functional Assessment Tool Used FOTO   Functional Limitation Carrying, moving and handling objects   Carrying, Moving and Handling Objects Current Status [phone]) 0 percent impaired, limited or restricted   Carrying, Moving and Handling Objects Goal Status (C9470) 0 percent impaired, limited or restricted   Carrying, Moving and Handling Objects Discharge Status [phone]) 0 percent impaired, limited or restricted      Problem List Patient Active Problem List   Diagnosis Date Noted  . Benign neoplasm of ascending colon 10/28/2014  . Internal hemorrhoids 10/28/2014  . Vulvar lesion 10/07/2014  . Abdominal pain, epigastric 07/10/2014  . Dry mouth 07/10/2014  . Weight loss 06/04/2014  . Prediabetes 05/13/2014  . Elevated serum creatinine 05/12/2014  . Injury of left index finger 01/23/2014  . Arm pain, left 01/23/2014  . Cervical polyp 10/14/2013  . Vaginal itching 11/03/2012  . Cystitis 10/16/2012  . Hyperlipidemia 08/26/2009  . Anxiety and depression 07/30/2008  . VAGINITIS, ATROPHIC 05/08/2008  . POSTMENOPAUSAL BLEEDING 11/29/2007  . SYSTOLIC MURMUR 66/29/4765  . HYPERTENSION, BENIGN ESSENTIAL 02/05/2007  . ANEMIA, IRON DEFICIENCY, UNSPEC. 06/15/2006  . SICKLE CELL TRAIT 06/15/2006  . RHINITIS, ALLERGIC 06/15/2006  . GASTROESOPHAGEAL REFLUX, NO ESOPHAGITIS 06/15/2006    Lauree Yurick 12/25/2014, 8:44 AM  [facility] [address] [place], [place], [address] Phone: [phone]   Fax:  [phone]    PHYSICAL THERAPY DISCHARGE SUMMARY  Visits from Start of Care: 9  Current functional level related to goals / functional  outcomes: See above goals.    Remaining deficits: None limiting function   Education / Equipment: Posture, HEP and RICE Plan: Patient agrees to discharge.  Patient goals were met. Patient is being discharged due to meeting the stated rehab goals.  ?????   Raeford Razor, PT 12/05/2014 8:45 AM Phone: [phone] Fax: [phone]

## 2014-12-09 ENCOUNTER — Encounter: Payer: Self-pay | Admitting: Family Medicine

## 2014-12-09 ENCOUNTER — Ambulatory Visit (INDEPENDENT_AMBULATORY_CARE_PROVIDER_SITE_OTHER): Payer: Medicare Other | Admitting: Family Medicine

## 2014-12-09 VITALS — BP 124/66 | HR 98 | Temp 98.6°F | Ht 62.0 in | Wt 119.0 lb

## 2014-12-09 DIAGNOSIS — R634 Abnormal weight loss: Secondary | ICD-10-CM

## 2014-12-09 DIAGNOSIS — M79671 Pain in right foot: Secondary | ICD-10-CM | POA: Diagnosis not present

## 2014-12-09 NOTE — Assessment & Plan Note (Signed)
Weight continues to be stable. Pt now requesting help with gaining weight. Discussed remeron as an option with her, but she states she does not want to take an antidepressant. Will refer to Dr. Jenne Campus for nutritional advice. F/u with me in 6 weeks.

## 2014-12-09 NOTE — Assessment & Plan Note (Signed)
Some persistent bony tenderness. Neurovascularly intact. No signs of infection in foot. - given postop shoe for support - encouraged use of tramadol or tylenol for pain - ice the foot - pt will call us if still having pain next week, at which time we will order a repeat xray to eval for occult fx.

## 2014-12-09 NOTE — Progress Notes (Signed)
Patient ID: Regina Sawyer, female   DOB: 31-Aug-1955, 59 y.o.   MRN: [id number]  HPI:  F/u weight: continues to feel well. Occasional acid reflux symptoms. Has been drinking almond milk as has less stomach gurgling with this. Still wants to gain weight but is not interested in remeron today.  Foot pain - last Wednesday a 2x4 fell on her foot. Went to urgent care and had xray that was neg for fracture. Was given rx for tramadol but has not used this due to concerns after reading the potential side effects.  ROS: See HPI.  [place]: hx anemia, anx/dep, GERD, HTN  PHYSICAL EXAM: BP 124/66 mmHg  Pulse 98  Temp(Src) 98.6 F (37 C) (Oral)  Ht 5\' 2"  (1.575 m)  Wt 119 lb (53.978 kg)  BMI 21.76 kg/m2 Gen: NAD HEENT: NCAT Abd: soft, NTTP Neuro: grossly nonfocal, speech normal Ext: R foot with some swelling and tenderness over midfoot. Laceration healing without any signs of infection (no erythema or drainage). Full strength with ankle plantarflexion/dorsiflexion/inversion/eversion. 2+ DP pulse. Distal sensation intact. Brisk capillary refill.  ASSESSMENT/PLAN:  Weight loss Weight continues to be stable. Pt now requesting help with gaining weight. Discussed remeron as an option with her, but she states she does not want to take an antidepressant. Will refer to Dr. Jenne Campus for nutritional advice. F/u with me in 6 weeks.  Right foot pain Some persistent bony tenderness. Neurovascularly intact. No signs of infection in foot. - given postop shoe for support - encouraged use of tramadol or tylenol for pain - ice the foot - pt will call us if still having pain next week, at which time we will order a repeat xray to eval for occult fx.   FOLLOW UP: F/u in 6 weeks for above issues Referring to nutritionist  [place]. Ardelia Mems, [place]

## 2014-12-09 NOTE — Patient Instructions (Signed)
Call us next week on Monday if your foot is still hurting, and I'll order an xray.  For weight management: Call Dr. Jenne Campus (our nutritionist) to set up an appointment. Her phone number is: [phone].   Giving you a special shoe to wear that might help.  Follow up with me in 6 weeks.  Be well, Dr. Ardelia Mems

## 2014-12-17 DIAGNOSIS — H5501 Congenital nystagmus: Secondary | ICD-10-CM | POA: Diagnosis not present

## 2014-12-17 DIAGNOSIS — H53003 Unspecified amblyopia, bilateral: Secondary | ICD-10-CM | POA: Diagnosis not present

## 2014-12-17 DIAGNOSIS — H5010 Unspecified exotropia: Secondary | ICD-10-CM | POA: Diagnosis not present

## 2014-12-19 ENCOUNTER — Ambulatory Visit: Payer: Medicare Other | Admitting: Family Medicine

## 2014-12-20 ENCOUNTER — Encounter (HOSPITAL_COMMUNITY): Payer: Self-pay | Admitting: Emergency Medicine

## 2014-12-20 ENCOUNTER — Emergency Department (INDEPENDENT_AMBULATORY_CARE_PROVIDER_SITE_OTHER)
Admission: EM | Admit: 2014-12-20 | Discharge: 2014-12-20 | Disposition: A | Discharge status: Home / Self Care | Payer: Medicare Other | Source: Home / Self Care | Attending: Family Medicine | Admitting: Family Medicine

## 2014-12-20 DIAGNOSIS — S90851S Superficial foreign body, right foot, sequela: Secondary | ICD-10-CM

## 2014-12-20 DIAGNOSIS — L089 Local infection of the skin and subcutaneous tissue, unspecified: Secondary | ICD-10-CM

## 2014-12-20 DIAGNOSIS — L03115 Cellulitis of right lower limb: Secondary | ICD-10-CM | POA: Diagnosis not present

## 2014-12-20 MED ORDER — CEFTRIAXONE SODIUM 1 G IJ SOLR
1.0000 g | Freq: Once | INTRAMUSCULAR | Status: AC
  Administered 2014-12-20: 1 g via INTRAMUSCULAR

## 2014-12-20 MED ORDER — CEFTRIAXONE SODIUM 1 G IJ SOLR
INTRAMUSCULAR | Status: AC
  Filled 2014-12-20: qty 10

## 2014-12-20 MED ORDER — TRAMADOL HCL 50 MG PO TABS: ORAL_TABLET | ORAL | Status: DC

## 2014-12-20 MED ORDER — LIDOCAINE HCL (PF) 1 % IJ SOLN
INTRAMUSCULAR | Status: AC
  Filled 2014-12-20: qty 5

## 2014-12-20 MED ORDER — CLINDAMYCIN HCL 300 MG PO CAPS: 300.0000 mg | ORAL_CAPSULE | Freq: Three times a day (TID) | ORAL | Status: DC

## 2014-12-20 NOTE — ED Provider Notes (Addendum)
CSN: [id number]     Arrival date & time 12/20/14  1344 History   First MD Initiated Contact with Patient 12/20/14 1427     Chief Complaint  Patient presents with  . Foot Pain   (Consider location/radiation/quality/duration/timing/severity/associated sxs/prior Treatment) HPI Comments: 59 year old female was seen in the emergency department on 12/04/2014 after a 2 x 4 fell on the dorsal aspect of her foot. She received what appeared to be a small abrasion to the dorsum of the foot as well as pain within the foot. The x-ray she obtain was normal in regards to bony structures of the foot and ankle. It was noted on the lateral view that there was a foreign body located superficially to the dorsal aspect. Over the ensuing couple of weeks she now has swelling, pain and erythema involving the foot and a portion of the ankle. The area over which the contact was made on the dorsum of the foot has formed scar tissue. Most all of the foot and a portion of the ankle is tender to palpation. There is no lymphangitis. Pain is worse with weightbearing.   Past Medical History  Diagnosis Date  . Hypertension   . GERD (gastroesophageal reflux disease)   . Heart murmur   . Ulcer   . Depression   . History of bacterial infection   . Sickle cell trait   . Tenderness of female pelvic organs 2005  . H/O vaginitis 2005  . Fibroid 2005  . BV (bacterial vaginosis) 2005  . Candida vaginitis 2006  . Libido, decreased 2006  . Female pelvic pain 2006  . Dysuria 2007  . Irregular periods/menstrual cycles 2007  . Menopausal symptoms 2007  . Hx: UTI (urinary tract infection) 2007  . H/O dyspareunia 2008  . Proteinuria 2009  . Nocturia 2009  . Candida vaginitis 2009  . Atrophic vaginitis 2009  . H/O constipation 2009  . History of hemorrhoids 2009  . Vaginal irritation 2011  . H/O urinary frequency 2011  . Asthma   . Hyperlipidemia    Past Surgical History  Procedure Laterality Date  . Carpal tunnel release       both wrists  . Tubal ligation    . Esophagogastroduodenoscopy N/A 08/06/2014    Procedure: ESOPHAGOGASTRODUODENOSCOPY (EGD);  Surgeon: Inda Castle, MD;  Location: [facility];  Service: Endoscopy;  Laterality: N/A;  . Colonoscopy N/A 10/28/2014    Procedure: COLONOSCOPY;  Surgeon: Inda Castle, MD;  Location: [facility];  Service: Endoscopy;  Laterality: N/A;   Family History  Problem Relation Age of Onset  . Diabetes Sister   . Hypertension Sister   . Diabetes Other   . Colon cancer Father   . Colon polyps Neg Hx   . Kidney disease Neg Hx   . Esophageal cancer Neg Hx   . Heart failure Brother     x2  . Gallbladder disease Neg Hx   . Gallstones Mother    Social History  Substance Use Topics  . Smoking status: Passive Smoke Exposure - Never Smoker  . Smokeless tobacco: Never Used  . Alcohol Use: No   OB History    Gravida Para Term Preterm AB TAB SAB Ectopic Multiple Living   2 2 2  0 0 0 0 0 0 2     Review of Systems  Constitutional: Positive for activity change. Negative for fever and fatigue.  Respiratory: Negative.   Gastrointestinal: Negative.   Musculoskeletal: Positive for gait problem.  Skin: Positive for color  change and wound.  Neurological: Negative for dizziness, light-headedness and headaches.  Psychiatric/Behavioral: Negative.   All other systems reviewed and are negative.   Allergies  Atorvastatin and Latex  Home Medications   Prior to Admission medications   Medication Sig Start Date End Date Taking? Authorizing Provider  Chlorpheniramine Maleate (ALLERGY PO) Take 1 tablet by mouth daily as needed (allergies.).    Historical Provider, MD  clindamycin (CLEOCIN) 300 MG capsule Take 1 capsule (300 mg total) by mouth 3 (three) times daily. 12/20/14   Janne Napoleon, NP  dexlansoprazole (DEXILANT) 60 MG capsule Take 1 capsule (60 mg total) by mouth daily. 11/21/14   Inda Castle, MD  hydrocortisone cream 0.5 % Apply 1 application topically 2 (two)  times daily. Patient not taking: Reported on 09/29/2014 08/01/14   Leeanne Rio, MD  traMADol (ULTRAM) 50 MG tablet 1-2 tabs po q 6 hr prn pain Maximum dose= 8 tablets per day 12/20/14   Janne Napoleon, NP   Meds Ordered and Administered this Visit   Medications  cefTRIAXone (ROCEPHIN) injection 1 g (not administered)    BP 144/75 mmHg  Pulse 76  Temp(Src) 98.4 F (36.9 C) (Oral)  Resp 20  SpO2 100% No data found.   Physical Exam  Constitutional: She is oriented to person, place, and time. She appears well-developed and well-nourished. No distress.  Pulmonary/Chest: Effort normal. No respiratory distress.  Musculoskeletal: Normal range of motion. She exhibits edema and tenderness.  The right foot with generalized edema and erythema. There is scar like tissue forming above the area of contact that matches the area of a possible foreign body on the x-ray. There is no fluctuance, draining or bleeding. There is tenderness to the entire foot as well as the area of contact with the board to the dorsum of the foot and lesser to the distal ankle. No lymphangitis is seen.  Neurological: She is alert and oriented to person, place, and time. She exhibits normal muscle tone.  Skin: Skin is warm and dry. No rash noted.  See musculoskeletal exam above.  Psychiatric: She has a normal mood and affect.  Nursing note and vitals reviewed.   ED Course  Procedures (including critical care time)  Labs Review Labs Reviewed - No data to display  Imaging Review No results found.   Visual Acuity Review  Right Eye Distance:   Left Eye Distance:   Bilateral Distance:    Right Eye Near:   Left Eye Near:    Bilateral Near:         MDM   1. Cellulitis of right foot   2. Foreign body in foot, right, infected, sequela    Read these instructions, if you get worse, fevers, increase swelling, red streaks, increased pain or other problems go to the Emergency Department promptly. Call Monday  the podiatry for immediate appointment  Rocephin 1 gm IM now Rx tramadol 50 mg #20 Clindamycin 300 tid   Janne Napoleon, NP 12/20/14 [place], NP 12/20/14 1520

## 2014-12-20 NOTE — Discharge Instructions (Signed)
Cellulitis Read these instructions, if you get worse, fevers, increase swelling, red streaks, increased pain or other problems go to the Emergency Department promptly. Call Monday the podiatry for immediate appointment Cellulitis is an infection of the skin and the tissue beneath it. The infected area is usually red and tender. Cellulitis occurs most often in the arms and lower legs.  CAUSES  Cellulitis is caused by bacteria that enter the skin through cracks or cuts in the skin. The most common types of bacteria that cause cellulitis are staphylococci and streptococci. SIGNS AND SYMPTOMS   Redness and warmth.  Swelling.  Tenderness or pain.  Fever. DIAGNOSIS  Your health care provider can usually determine what is wrong based on a physical exam. Blood tests may also be done. TREATMENT  Treatment usually involves taking an antibiotic medicine. HOME CARE INSTRUCTIONS   Take your antibiotic medicine as directed by your health care provider. Finish the antibiotic even if you start to feel better.  Keep the infected arm or leg elevated to reduce swelling.  Apply a warm cloth to the affected area up to 4 times per day to relieve pain.  Take medicines only as directed by your health care provider.  Keep all follow-up visits as directed by your health care provider. SEEK MEDICAL CARE IF:   You notice red streaks coming from the infected area.  Your red area gets larger or turns dark in color.  Your bone or joint underneath the infected area becomes painful after the skin has healed.  Your infection returns in the same area or another area.  You notice a swollen bump in the infected area.  You develop new symptoms.  You have a fever. SEEK IMMEDIATE MEDICAL CARE IF:   You feel very sleepy.  You develop vomiting or diarrhea.  You have a general ill feeling (malaise) with muscle aches and pains. MAKE SURE YOU:   Understand these instructions.  Will watch your  condition.  Will get help right away if you are not doing well or get worse. Document Released: 01/12/2005 Document Revised: 08/19/2013 Document Reviewed: 06/20/2011 [facility] Patient Information 2015 [place], [place]. This information is not intended to replace advice given to you by your health care provider. Make sure you discuss any questions you have with your health care provider.

## 2014-12-20 NOTE — ED Notes (Signed)
C/o persistent right foot pain; has a small scar on top of foot where 2x4 wood fell on it.  Seen here on 8/18; given Tramadol for pain w/no relief... X ray = neg Sx today include swelling of foot/ankle, pain that increases when bearing wt Slow steady gait No acute distress.

## 2014-12-24 ENCOUNTER — Telehealth: Payer: Self-pay | Admitting: Family Medicine

## 2014-12-24 DIAGNOSIS — M79673 Pain in unspecified foot: Secondary | ICD-10-CM

## 2014-12-24 NOTE — Telephone Encounter (Signed)
Referral entered [facility] More J Dearl Rudden, MD  

## 2014-12-24 NOTE — Telephone Encounter (Signed)
Pt called because she was seen at a [facility] and they referred her to [place]. She would like the referral put in there so that she can continue to go there. jw

## 2015-01-07 DIAGNOSIS — B354 Tinea corporis: Secondary | ICD-10-CM | POA: Diagnosis not present

## 2015-01-07 DIAGNOSIS — N761 Subacute and chronic vaginitis: Secondary | ICD-10-CM | POA: Diagnosis not present

## 2015-01-26 ENCOUNTER — Ambulatory Visit: Payer: Medicare Other | Admitting: Podiatry

## 2015-02-02 ENCOUNTER — Ambulatory Visit (INDEPENDENT_AMBULATORY_CARE_PROVIDER_SITE_OTHER): Payer: Medicare Other

## 2015-02-02 ENCOUNTER — Encounter: Payer: Self-pay | Admitting: Podiatry

## 2015-02-02 ENCOUNTER — Ambulatory Visit (INDEPENDENT_AMBULATORY_CARE_PROVIDER_SITE_OTHER): Payer: Medicare Other | Admitting: Podiatry

## 2015-02-02 VITALS — BP 112/84 | HR 84 | Resp 16 | Ht 62.0 in | Wt 145.0 lb

## 2015-02-02 DIAGNOSIS — M79671 Pain in right foot: Secondary | ICD-10-CM

## 2015-02-02 DIAGNOSIS — M779 Enthesopathy, unspecified: Secondary | ICD-10-CM | POA: Diagnosis not present

## 2015-02-02 MED ORDER — TRIAMCINOLONE ACETONIDE 10 MG/ML IJ SUSP
10.0000 mg | Freq: Once | INTRAMUSCULAR | Status: AC
  Administered 2015-02-02: 10 mg

## 2015-02-02 NOTE — Progress Notes (Signed)
   Subjective:    Patient ID: Regina Sawyer, female    DOB: [birth year], 57 y.o.   MRN: [id number]  HPI Patient presents with foot pain in their right foot, top of foot. Pt stated, "2x4 fell on bare foot" Aug. 18, 2016. Pt went to urgent care last month and they said "piece of wood was stuck in foot"; referred over here.   Review of Systems  All other systems reviewed and are negative.      Objective:   Physical Exam        Assessment & Plan:

## 2015-02-03 NOTE — Progress Notes (Signed)
Subjective:     Patient ID: Regina Sawyer, female   DOB: [birth year], 59 y.o.   MRN: [id number]  HPI patient presents stating she has pain on top of her right foot that has been present since she had an injury several months ago. Did not have infection but was concerned about pain and difficulty wearing shoes   Review of Systems  All other systems reviewed and are negative.      Objective:   Physical Exam  Constitutional: She is oriented to person, place, and time.  Cardiovascular: Intact distal pulses.   Musculoskeletal: Normal range of motion.  Neurological: She is oriented to person, place, and time.  Skin: Skin is warm.  Nursing note and vitals reviewed.  neurovascular status intact muscle strength adequate range of motion within normal limits with patient noted to have a painful right dorsal foot with inflammation along the tendon complex and fluid buildup. There is no indication of drainage and there is a small scar from previous injury but it does not appear to be contributory towards her pain     Assessment:     Dorsal tendinitis right foot with inflammation    Plan:     Explained condition and injected the dorsal tendon complex 3 mg Kenalog 5 mg Xylocaine advised on physical therapy reviewed x-rays and reappoint as symptoms indicate

## 2015-02-12 ENCOUNTER — Ambulatory Visit (INDEPENDENT_AMBULATORY_CARE_PROVIDER_SITE_OTHER): Payer: Medicare Other | Admitting: Family Medicine

## 2015-02-12 ENCOUNTER — Encounter: Payer: Self-pay | Admitting: Family Medicine

## 2015-02-12 VITALS — Ht 62.0 in | Wt 124.2 lb

## 2015-02-12 DIAGNOSIS — R7303 Prediabetes: Secondary | ICD-10-CM | POA: Diagnosis not present

## 2015-02-12 DIAGNOSIS — R634 Abnormal weight loss: Secondary | ICD-10-CM | POA: Diagnosis not present

## 2015-02-12 NOTE — Patient Instructions (Addendum)
-   Go to your bank to make sure the autodraft for the fitness center is STOPPED for sure, and get written documentation for this.   - You may be able to get a scholarship at the [facility].  You can call to ask what you need to do to apply.   - Some mild to moderate physical activity may help your appetite, for example, walking a few minutes each day.   Starchy (carb) foods: Bread, rice, pasta, potatoes, corn, cereal, grits, crackers, bagels, muffins, all baked goods.   Protein foods: Meat, fish, poultry, eggs, dairy foods, and beans such as pinto and kidney beans (beans also provide carbohydrate).   GOALS: 1. Eat at least 3 REAL meals and 1-2 snacks per day.  Aim for no more than 5 hours between eating.  Eat breakfast within one hour of getting up.   - A REAL meal includes a source of protein, starch, and vegetables and/or fruit.   - Home-prepared breakfast ideas:  Cereal with soy milk; peanut butter on toast or crackers; cheese on toast; oatmeal; grits; eggs of any kind; dinner leftovers.    - Plan your meals and snacks in advance.    - Getting used to eating this many meals again will take some real effort, but your body can re-adapt to this, and it will be best for you.   2. Limit sweet drinks to no more than 8 oz per day.  Increase water, aiming for at least 40 oz of water a day.   3. Walk for at least 15 minutes a day.  It will probably be easiest to do if you can choose a consistent time to walk each day.    - Candy and sweets:  Consider these to be "sometimes" foods, not "everyday" foods.  Try to keep less candy (or none?) at home, and more fruit.    - A weight of 136 would give you a BMI of 24.9, which is the upper end of NORMAL for weight.  (Above this number is "overweight.")

## 2015-02-12 NOTE — Progress Notes (Signed)
Medical Nutrition Therapy:  Appt start time: 1430 end time:  6237.  Assessment:  Primary concerns today: unintentional weight loss. (R63.4) Ms. Baumler was accompanied by her husband today.  In an effort to gain weight, Ms. Gloster has been using a couple of products:  Naturade Weight Gain powder (10 g protein, 9 g fat, 23 g CHO, 300 mg Na, 420 mg K, 220 kcal; $21 for 12 servings) and CB-1 Weight gainer (by [employer]), which includes 1500 IU vit D3, 6 mg Zn, and a mixture of 2 types of echinacea, soy lecithin, and vitamin E.    Usual eating pattern includes 1-2 meals and 0-1 snack per day. Frequent foods and beverages include chx, hamburger, potatoes, fish, water, soda, sweet tea.  Avoided foods include milk foods (lactose intol), spicy foods (reflux), veg's (disliked).   Usual physical activity includes none currently.  Had joined [employer], but hasn't been going.   Works at [employer] from 9 AM to 1:30 PM. Sometimes eats a meal after work (usually take-out), but on those days she then doesn't eat dinner.  Most meals are takeout/restaurant foods, including breakfast.   Learning Readiness: Ready  Barriers to learning/adherence to lifestyle change: Nutrition knowledge deficit, i.e., concerns that soy or almond milk will make her lose weight.  Also, Ms. Barbour is unaccustomed to giving any forethought to meals or snacks, which means sometimes she is left with few options.    24-hr recall suggests an intake of ~870 kcal: (Up at 7 AM) B (8:15 AM)-   2 [employer] hash browns, 8 oz orange soda    400 Snk (9:30)-   4 oz sweet tea         50 L ( PM)-  --- Snk ( PM)-  --- D (6 PM)-  1 chx drumette & wing, 1/4 c rice, 1/4 c gravy, 8 oz swt tea  240 Snk (6:30)-  2 small Reese's cup, water      180 Typical day? No.  Usually has fruit at morning break.    Progress Towards Goal(s):  In progress.   Nutritional Diagnosis:  -3.2 Unintentional weight loss As related to poor eating pattern.  As  evidenced by erratic eating schedule that includes no more than 2 real meals /day.    Intervention:  Nutrition education.  Handouts given during visit include:  AVS  Demonstrated degree of understanding via:  Teach Back   Monitoring/Evaluation:  Dietary intake, exercise, and body weight in 6 week(s).

## 2015-02-19 DIAGNOSIS — R350 Frequency of micturition: Secondary | ICD-10-CM | POA: Diagnosis not present

## 2015-02-19 DIAGNOSIS — N771 Vaginitis, vulvitis and vulvovaginitis in diseases classified elsewhere: Secondary | ICD-10-CM | POA: Diagnosis not present

## 2015-02-19 DIAGNOSIS — N7689 Other specified inflammation of vagina and vulva: Secondary | ICD-10-CM | POA: Diagnosis not present

## 2015-03-19 DIAGNOSIS — N762 Acute vulvitis: Secondary | ICD-10-CM | POA: Diagnosis not present

## 2015-03-19 DIAGNOSIS — R350 Frequency of micturition: Secondary | ICD-10-CM | POA: Diagnosis not present

## 2015-03-19 DIAGNOSIS — R102 Pelvic and perineal pain: Secondary | ICD-10-CM | POA: Diagnosis not present

## 2015-03-23 ENCOUNTER — Telehealth: Payer: Self-pay | Admitting: Gastroenterology

## 2015-03-23 NOTE — Telephone Encounter (Signed)
Phone is answered. I can hear a TV in the background, but no one will speak. Wanted to offer her an appointment.

## 2015-03-24 ENCOUNTER — Ambulatory Visit: Payer: Medicare Other | Admitting: Family Medicine

## 2015-03-24 NOTE — Telephone Encounter (Signed)
Patient has found certain foods cause her to be more "gassy". She is taking Dexilant every morning on an empty stomach as directed. She will continue this and may add Simethicone PRN. She will discuss this with her PCP. Appointment made to establish her GI care with Dr Havery Moros.

## 2015-03-30 ENCOUNTER — Encounter: Payer: Self-pay | Admitting: Family Medicine

## 2015-03-30 ENCOUNTER — Ambulatory Visit (INDEPENDENT_AMBULATORY_CARE_PROVIDER_SITE_OTHER): Payer: Medicare Other | Admitting: Family Medicine

## 2015-03-30 VITALS — BP 137/66 | HR 79 | Temp 98.2°F | Ht 62.0 in | Wt 121.7 lb

## 2015-03-30 DIAGNOSIS — R358 Other polyuria: Secondary | ICD-10-CM

## 2015-03-30 DIAGNOSIS — R3589 Other polyuria: Secondary | ICD-10-CM

## 2015-03-30 DIAGNOSIS — R7309 Other abnormal glucose: Secondary | ICD-10-CM | POA: Diagnosis not present

## 2015-03-30 DIAGNOSIS — R739 Hyperglycemia, unspecified: Secondary | ICD-10-CM

## 2015-03-30 LAB — BASIC METABOLIC PANEL
BUN: 8 mg/dL (ref 7–25)
CHLORIDE: 102 mmol/L (ref 98–110)
CO2: 29 mmol/L (ref 20–31)
Calcium: 9.5 mg/dL (ref 8.6–10.4)
Creat: 0.81 mg/dL (ref 0.50–1.05)
GLUCOSE: 80 mg/dL (ref 65–99)
POTASSIUM: 3.7 mmol/L (ref 3.5–5.3)
SODIUM: 140 mmol/L (ref 135–146)

## 2015-03-30 LAB — POCT URINALYSIS DIPSTICK
Bilirubin, UA: NEGATIVE
Blood, UA: NEGATIVE
GLUCOSE UA: NEGATIVE
Ketones, UA: NEGATIVE
LEUKOCYTES UA: NEGATIVE
NITRITE UA: NEGATIVE
Protein, UA: NEGATIVE
Spec Grav, UA: 1.02
UROBILINOGEN UA: 0.2
pH, UA: 6

## 2015-03-30 LAB — POCT GLYCOSYLATED HEMOGLOBIN (HGB A1C): Hemoglobin A1C: 5.2

## 2015-03-30 NOTE — Progress Notes (Signed)
Date of Visit: 03/30/2015   HPI:  Pt presents for a same day appointment to discuss possible issue with her kidney. Feels some pressure in lower abdomen and mild soreness. Also notes polyuria. Mild low back pain. No fever, dysuria, hesitancy, or urgency. Symptoms present for a few days. Postmenopausal. stooling normally.    ROS: See HPI  [place]: history of hyperlipidemia, sickle cell trait, anemia, anxiety/dep, hypertension, gerd   PHYSICAL EXAM: BP 137/66 mmHg  Pulse 79  Temp(Src) 98.2 F (36.8 C) (Oral)  Ht 5\' 2"  (1.575 m)  Wt 121 lb 11.2 oz (55.203 kg)  BMI 22.25 kg/m2 Gen: NAD, pleasant, cooperative HEENT: NCAT Heart: regular rate and rhythm  Lungs: clear to auscultation bilaterally normal work of breathing  Abdomen: soft nontender to palpation, no masses or organomegaly, normoactive bowel sounds  Neuro: grossly nonfocal speech normal Back: no CVA tenderness bilaterally  ASSESSMENT/PLAN:  1. Urinary symptoms - urinalysis completely normal. Well appearing. Given complaint of polyuria will check BMET and A1c. Follow up as needed. Encouraged intake of water.   FOLLOW UP: F/u as needed if symptoms worsen or do not improve.   [place]. Ardelia Mems, [place]

## 2015-03-30 NOTE — Patient Instructions (Signed)
Urine looks fine today Checking kidney function and for diabetes  Follow up as needed if the discomfort and pressure doesn't get better Drink plenty of water  Be well, Dr. Ardelia Mems

## 2015-03-31 ENCOUNTER — Encounter: Payer: Self-pay | Admitting: Family Medicine

## 2015-04-01 ENCOUNTER — Telehealth: Payer: Self-pay | Admitting: Family Medicine

## 2015-04-01 NOTE — Telephone Encounter (Signed)
Would like test results. °

## 2015-04-02 ENCOUNTER — Ambulatory Visit: Payer: Medicare Other | Admitting: Family Medicine

## 2015-04-02 NOTE — Telephone Encounter (Signed)
Attempted to call pt but phone kept ringing. Please informe her of message below. Fusae Florio Kennon Holter, CMA

## 2015-04-02 NOTE — Telephone Encounter (Signed)
Please let her know all results were normal. Letter should be coming in the mail.  Thanks, Leeanne Rio, MD

## 2015-04-08 ENCOUNTER — Telehealth: Payer: Self-pay | Admitting: Family Medicine

## 2015-04-08 DIAGNOSIS — F520 Hypoactive sexual desire disorder: Secondary | ICD-10-CM | POA: Diagnosis not present

## 2015-04-08 DIAGNOSIS — R102 Pelvic and perineal pain: Secondary | ICD-10-CM | POA: Diagnosis not present

## 2015-04-08 NOTE — Telephone Encounter (Signed)
BP is up  120 over something, says she is drowsy, head is swimming, slight headache Wants to go back on her BP pills

## 2015-04-09 NOTE — Telephone Encounter (Signed)
Called patient. She was unable to tell me what her blood pressure was yesterday but reports feeling better today. Says she feels a little swimmy-headed at the top of her head but overall better. Her recent BP's have been normal in the clinic. Advised if she's still having these symptoms next week to come in for a visit to have her BP checked & these symptoms evaluated. She was agreeable to that plan.  Leeanne Rio, MD

## 2015-04-10 ENCOUNTER — Telehealth: Payer: Self-pay | Admitting: Family Medicine

## 2015-04-10 NOTE — Telephone Encounter (Signed)
This is a normal BP. Please inform patient.  Leeanne Rio, MD

## 2015-04-10 NOTE — Telephone Encounter (Signed)
Pt called and wanted the doctor to know that her BP today was 128/72. Blima Rich

## 2015-04-15 NOTE — Telephone Encounter (Signed)
Left message on voicemail informing of message from MD. 

## 2015-05-04 ENCOUNTER — Ambulatory Visit (INDEPENDENT_AMBULATORY_CARE_PROVIDER_SITE_OTHER): Payer: Medicare Other | Admitting: Family Medicine

## 2015-05-04 ENCOUNTER — Telehealth: Payer: Self-pay | Admitting: Family Medicine

## 2015-05-04 ENCOUNTER — Encounter: Payer: Self-pay | Admitting: Family Medicine

## 2015-05-04 VITALS — BP 143/64 | Temp 99.0°F | Wt 117.2 lb

## 2015-05-04 VITALS — Ht 62.0 in

## 2015-05-04 DIAGNOSIS — R7303 Prediabetes: Secondary | ICD-10-CM | POA: Diagnosis not present

## 2015-05-04 DIAGNOSIS — I10 Essential (primary) hypertension: Secondary | ICD-10-CM

## 2015-05-04 DIAGNOSIS — R350 Frequency of micturition: Secondary | ICD-10-CM | POA: Diagnosis not present

## 2015-05-04 DIAGNOSIS — R634 Abnormal weight loss: Secondary | ICD-10-CM

## 2015-05-04 DIAGNOSIS — N7689 Other specified inflammation of vagina and vulva: Secondary | ICD-10-CM | POA: Diagnosis not present

## 2015-05-04 DIAGNOSIS — N771 Vaginitis, vulvitis and vulvovaginitis in diseases classified elsewhere: Secondary | ICD-10-CM | POA: Diagnosis not present

## 2015-05-04 MED ORDER — AMLODIPINE BESYLATE 5 MG PO TABS: 5.0000 mg | ORAL_TABLET | Freq: Every day | ORAL | Status: DC

## 2015-05-04 NOTE — Assessment & Plan Note (Signed)
Patient is here with complaints of increased blood pressure since the discontinuation of her thiazide diuretic. Patient's blood pressure is mildly elevated at 143/64. She states that earlier today it was checked and it was in the 150s/70s. I discussed this case with patient's PCP. - Initiating amlodipine 5 mg daily. - I have asked patient to make a follow-up appointment with her PCP in 2 weeks for blood pressure recheck and medication tolerance assessment.

## 2015-05-04 NOTE — Progress Notes (Signed)
   HPI  CC: hypertension Patient is here with complaints of increased hypertension. She was recently taken off of her thiazide diuretic due to concerns for hypercalcemia. Since that time this patient has noted a gradual increase in her blood pressure. She denies any symptoms of shortness of breath, vision changes, headache, confusion, chest pain, or abdominal pain. Patient has no other issues at this time.  Review of Systems   See HPI for ROS. All other systems reviewed and are negative.  Past medical history and social history reviewed and updated in the EMR as appropriate.  Objective: BP 143/64 mmHg  Temp(Src) 99 F (37.2 C) (Oral)  Wt 117 lb 3.2 oz (53.162 kg) Gen: NAD, alert, cooperative HEENT: NCAT, PERRL, MMM CV: RRR, no murmur Resp: CTAB, no wheezes, non-labored Ext: No edema, warm, cap refill <1sec Neuro: Alert and oriented, Speech clear, No gross deficits  Assessment and plan:  HYPERTENSION, BENIGN ESSENTIAL Patient is here with complaints of increased blood pressure since the discontinuation of her thiazide diuretic. Patient's blood pressure is mildly elevated at 143/64. She states that earlier today it was checked and it was in the 150s/70s. I discussed this case with patient's PCP. - Initiating amlodipine 5 mg daily. - I have asked patient to make a follow-up appointment with her PCP in 2 weeks for blood pressure recheck and medication tolerance assessment.    Meds ordered this encounter  Medications  . amLODipine (NORVASC) 5 MG tablet    Sig: Take 1 tablet (5 mg total) by mouth daily.    Dispense:  30 tablet    Refill:  0     Elberta Leatherwood, MD,MS,  PGY2 05/04/2015 6:03 PM

## 2015-05-04 NOTE — Patient Instructions (Signed)
It was a pleasure seeing you today in our clinic. Today we discussed your blood pressure. Here is the treatment plan we have discussed and agreed upon together:   - take this new medicine 1 time a day.  - Call our office if you experience any increased swelling of your hands or feet. - Your PCP would like to see you back in 2 weeks.

## 2015-05-04 NOTE — Progress Notes (Signed)
Medical Nutrition Therapy:  Appt start time: 1430 end time:  [id number].  Assessment:  Primary concerns today: Blood sugar control and unintentional weight loss. (R73.03 & R63.4) Ms. Grinnell's weight was down 4 lb since late-October (117 vs. 121) even though she expressed a strong interest in gaining weight when I spoke to her in Oct.  She said she has not been eating much b/c of stomach pain.  Bladder infection was diagnosed this morning by her OBGyn; however, I suspect stomach pain may be more related to usual breakfast of (fried) fast food.  OBGyn prescribed 5 days of nitrofurantoin 100 mg and 1 pill of fluconazole 150 mg today.    Usually getting only 2 meals a week, breakfast between 8 and 9 AM and 2nd meal is usually 4 or 5 PM.    Restaurant or takeout meals per week:  Breakfast 5 X wk of [employer] or [employer] (hash browns OR grits and chx tenders). Number of meals per day:  2   Advance planning for meals:  none  Learning Readiness: Ready  Barriers to learning/adherence to lifestyle change: Nutrition knowledge deficit (again brought up supplements or Rx med's that promote weight gain).  Another barrier to adherence is poor dental health; dentures are uncomfortable, so she never wears them.  Consequently, cannot chew many veg's/fruits/meats.    24-hr recall:  (Up at 8 AM) B (9 AM)-  [employer]: 2 chx tenders, 1 small grits, 6 oz gingerale Snk ( AM)-  --- L ( PM)-  --- Snk (4 PM)-  1/2 c mashed potato, fried chx drumsticks, water D (9 PM)-  1/2 c Jell-O  Snk ( PM)-  --- Typical day? Yes.    Progress Towards Goal(s):  In progress.   Nutritional Diagnosis:  No progress on: [place]-3.2 Unintentional weight loss As related to poor eating pattern.  As evidenced by erratic eating schedule that includes no more than 2 real meals /day.    Intervention:  Nutrition education.  Handouts given during visit include:  AVS  Demonstrated degree of understanding via:  Teach Back   Monitoring/Evaluation:   Dietary intake, exercise, and body weight in 4 week(s).

## 2015-05-04 NOTE — Patient Instructions (Signed)
-   Fried foods are NOT good for you, especially when you are getting some every day.   - Fried foods, or any foods high in fat, can also make it harder for you to feel hungry because fat takes a long time to empty out of your stomach.    - In addition, these high-fat foods will trigger your reflux, again making it harder to eat.    - If you don't fry meats, you don't have to just boil them.  Baked meats can be very good too.  Bake a large amount, and have left-overs and enough to freeze.   - Breakfast at home will be way better for you.  At your last appt, we talked about some breakfast options, such as:   - Cereal with Lactaid milk (1% or 2%); the best cereals have at least 5 grams of fiber per serving  - Peanut butter on toast or crackers  - Cheese on toast  - Oatmeal or grits  - Eggs of any kind  - Radiographer, therapeutic  - AGAIN recommending that you aim for a minimum of 3 meals a day, and it will be best if you get 3 meals and at least one snack each day.    - You wil have to EAT at times even when you aren't hungry.  The more you can eat 3 real meals a day, the easier it will be to continue to eat 3 meals a day.   - SNACKS:   - Get at least one serving a day of FRESH fruit.    - Yogurt:  Many people with lactose intolerance CAN do ok with as much as 4 oz of yogurt (or even milk).    GOALS: 1. Eat at least 3 meals and 1-2 snacks per day.  Aim for no more than 5 hours between eating.  Eat breakfast within one hour of getting up.  2. Get at least one serving of vegetables and one serving of fruit each day.   3. Drink at least 32 oz of water per day.    - Make three lists of vegetables: (1) those you like and eat now; (2) vegetables you won't even consider; and (3) vegetables you might consider trying if they are prepared a certain way.  Continue to eat veg's you currently eat, but from this last list, choose a vegetable to try at least 3 times a week.  Use small amounts of this vegetable, cut  small, combined with foods or seasonings you like.    - VEGETABLES: Try ROASTING THEM:  Cut into bite-size, place in an oiled baking pan, spray with oil, and roast at high heat (375-400 degrees) till done.

## 2015-05-04 NOTE — Telephone Encounter (Signed)
Pt is here and states that her vision has been blurry and her BP was 152/78 this am. She feels like she needs to be put back on her BP medication. Pt states that she called about this recently but never heard back. I asked if it was from the note in December and she informs me that she called last week. I do not see a note taken. Pt would like to be contacted by PCP at the earliest convenience. Thank you, Fonda Kinder, ASA

## 2015-05-04 NOTE — Telephone Encounter (Signed)
Patient scheduled for SDA appointment with Dr. Alease Frame  Leeanne Rio, MD

## 2015-05-19 ENCOUNTER — Ambulatory Visit: Payer: Medicare Other | Admitting: Family Medicine

## 2015-05-20 ENCOUNTER — Ambulatory Visit (INDEPENDENT_AMBULATORY_CARE_PROVIDER_SITE_OTHER): Payer: Medicare Other | Admitting: Gastroenterology

## 2015-05-20 ENCOUNTER — Other Ambulatory Visit (INDEPENDENT_AMBULATORY_CARE_PROVIDER_SITE_OTHER): Payer: Medicare Other

## 2015-05-20 ENCOUNTER — Encounter: Payer: Self-pay | Admitting: Gastroenterology

## 2015-05-20 VITALS — BP 140/70 | HR 104 | Ht 63.0 in | Wt 121.1 lb

## 2015-05-20 DIAGNOSIS — R140 Abdominal distension (gaseous): Secondary | ICD-10-CM | POA: Diagnosis not present

## 2015-05-20 DIAGNOSIS — Z86010 Personal history of colonic polyps: Secondary | ICD-10-CM | POA: Diagnosis not present

## 2015-05-20 LAB — IGA: IgA: 365 mg/dL (ref 68–378)

## 2015-05-20 MED ORDER — RIFAXIMIN 550 MG PO TABS
550.0000 mg | ORAL_TABLET | Freq: Three times a day (TID) | ORAL | Status: DC
Start: 2015-05-20 — End: 2015-07-06

## 2015-05-20 NOTE — Progress Notes (Signed)
HPI :  60 y/o female previously known to Dr. Deatra Ina, here for follow up. Her main complaints are chronic gas / bloating and being underweight.  She reports she has a hard time eating since she does not have teeth. She doesn't each much solid foods, only soft foods and liquids. She snacks throughout the day and does not eat any meals. She has drank some protein shakes but it causes worsening gas, she thinks due to milk content, and her reflux to feel worse. She has drank lactaid which can help. She thinks passing gas bothers her the most right now.  She also has bloating for which she uses gas-ex. No abdominal pains that bother her, only gas pains. She thinks her weight is stable and goes up and down. She was in the 110s earlier last year, but now around 121. She denies nausea or vomiting. She has a poor appetite in general.  She states she takes Dexilant for which she takes every day. She does think it helps with heartburn, but not gas. She denies any diarrhea or constipation. She has had an extensive workup for this issue as outlined below. No interval changes since her last visit. Father with a reported history of colon cancer. Last colonoscopy July 2016 with small adenoma removed.   Prior workup EGD 07/2014 - benign gastric polyps, otherwise normal Colonoscopy - 10/2014 - 2 small adenomas, recall in 5 years CT 07/2014 - was unremarkable except for uterine fibroids  Gastric emptying scan 5/16 - normal  Past Medical History  Diagnosis Date  . Hypertension   . GERD (gastroesophageal reflux disease)   . Heart murmur   . Ulcer   . Depression   . History of bacterial infection   . Sickle cell trait ([place])   . Tenderness of female pelvic organs 2005  . H/O vaginitis 2005  . Fibroid 2005  . BV (bacterial vaginosis) 2005  . Candida vaginitis 2006  . Libido, decreased 2006  . Female pelvic pain 2006  . Dysuria 2007  . Irregular periods/menstrual cycles 2007  . Menopausal symptoms 2007  . Hx:  UTI (urinary tract infection) 2007  . H/O dyspareunia 2008  . Proteinuria 2009  . Nocturia 2009  . Candida vaginitis 2009  . Atrophic vaginitis 2009  . H/O constipation 2009  . History of hemorrhoids 2009  . Vaginal irritation 2011  . H/O urinary frequency 2011  . Asthma   . Hyperlipidemia      Past Surgical History  Procedure Laterality Date  . Carpal tunnel release      both wrists  . Tubal ligation    . Esophagogastroduodenoscopy N/A 08/06/2014    Procedure: ESOPHAGOGASTRODUODENOSCOPY (EGD);  Surgeon: Inda Castle, MD;  Location: [facility];  Service: Endoscopy;  Laterality: N/A;  . Colonoscopy N/A 10/28/2014    Procedure: COLONOSCOPY;  Surgeon: Inda Castle, MD;  Location: [facility];  Service: Endoscopy;  Laterality: N/A;   Family History  Problem Relation Age of Onset  . Diabetes Sister   . Hypertension Sister   . Diabetes Other   . Colon cancer Father   . Colon polyps Neg Hx   . Kidney disease Neg Hx   . Esophageal cancer Neg Hx   . Heart failure Brother     x2  . Gallbladder disease Neg Hx   . Gallstones Mother    Social History  Substance Use Topics  . Smoking status: Passive Smoke Exposure - Never Smoker  . Smokeless tobacco: Never  Used  . Alcohol Use: No   Current Outpatient Prescriptions  Medication Sig Dispense Refill  . amLODipine (NORVASC) 5 MG tablet Take 1 tablet (5 mg total) by mouth daily. 30 tablet 0  . dexlansoprazole (DEXILANT) 60 MG capsule Take 1 capsule (60 mg total) by mouth daily. 90 capsule 3  . Multiple Vitamins-Minerals (MULTIVITAMIN PO) Take 1 tablet by mouth daily.     No current facility-administered medications for this visit.   Allergies  Allergen Reactions  . Atorvastatin Other (See Comments)    Abdominal pain, severe nausea, muscle / body aches  . Latex Hives and Itching     Review of Systems: All systems reviewed and negative except where noted in HPI.   Lab Results  Component Value Date   WBC 4.0  07/23/2014   HGB 11.8* 07/23/2014   HCT 37.4 07/23/2014   MCV 85.4 07/23/2014   PLT 380 07/23/2014    Lab Results  Component Value Date   CREATININE 0.81 03/30/2015   BUN 8 03/30/2015   NA 140 03/30/2015   K 3.7 03/30/2015   CL 102 03/30/2015   CO2 29 03/30/2015    Lab Results  Component Value Date   ALT 12 07/23/2014   AST 17 07/23/2014   ALKPHOS 60 07/23/2014   BILITOT 0.5 07/23/2014     Physical Exam: BP 140/70 mmHg  Pulse 104  Ht 5\' 3"  (1.6 m)  Wt 121 lb 2 oz (54.942 kg)  BMI 21.46 kg/m2 Constitutional: Pleasant,well-developed, female in no acute distress. HEENT: Normocephalic and atraumatic. Conjunctivae are normal. No scleral icterus. Neck supple.  Cardiovascular: Normal rate, regular rhythm. 2/6 SEM Pulmonary/chest: Effort normal and breath sounds normal. No wheezing, rales or rhonchi. Abdominal: Soft, nondistended, nontender. Bowel sounds active throughout. There are no masses palpable. No hepatomegaly. Extremities: no edema Lymphadenopathy: No cervical adenopathy noted. Neurological: Alert and oriented to person place and time. Skin: Skin is warm and dry. No rashes noted. Psychiatric: Normal mood and affect. Behavior is normal.   ASSESSMENT AND PLAN:  60 y/o female with chronic postprandial "gas / bloating" and discomfort in her upper abdomen, associated with poor PO intake. Her weight has been stable since her last visit. She has not tried much of any high calory dietary supplements such as Boost or Ensure given she doesn't tolerate them well. I reassured her the workup to date has been unremarkable. She has tried lactaid which has not helped too much. Given her predominant history of increased gas / bloating will check celiac serologies to ensure negative although prior EGD did not show small bowel pathology. I otherwise offered her an empiric trial of Rifaximin for 2 weeks to see if this would improve much of her symptoms. Otherwise, she has poor dentition  and this is limiting her ability to chew food and eat more solid foods, suspect leading to lower caloric intake, although her weight is stable. We will see how she responds to Rifaximin, and await her labs. Otherwise due for a recall colonoscopy in 2021. She agreed with the plan and will follow up as needed for this issue.    Fessenden Cellar, MD [facility] Pager [phone]

## 2015-05-21 ENCOUNTER — Telehealth: Payer: Self-pay | Admitting: Gastroenterology

## 2015-05-21 LAB — TISSUE TRANSGLUTAMINASE, IGA: TISSUE TRANSGLUTAMINASE AB, IGA: 1 U/mL (ref <4)

## 2015-05-21 NOTE — Telephone Encounter (Signed)
Called pt to inform her that [facility] will help get Xifaxan approved by her pharmacy. She understands.

## 2015-05-21 NOTE — Telephone Encounter (Signed)
Faxed

## 2015-05-21 NOTE — Telephone Encounter (Signed)
Viral from [facility] states that he is needing chart notes on this patient. P: [phone] F: [phone]

## 2015-05-22 ENCOUNTER — Telehealth: Payer: Self-pay | Admitting: Gastroenterology

## 2015-05-22 NOTE — Telephone Encounter (Signed)
Patient given lab results.(negative for celiac)

## 2015-05-22 NOTE — Telephone Encounter (Signed)
Left a message for patient to call back. 

## 2015-06-01 ENCOUNTER — Other Ambulatory Visit: Payer: Self-pay | Admitting: Family Medicine

## 2015-06-01 ENCOUNTER — Ambulatory Visit: Payer: Medicare Other | Admitting: Family Medicine

## 2015-06-04 ENCOUNTER — Ambulatory Visit: Payer: Medicare Other | Admitting: Family Medicine

## 2015-06-12 ENCOUNTER — Telehealth: Payer: Self-pay | Admitting: Gastroenterology

## 2015-06-12 NOTE — Telephone Encounter (Signed)
Patient notified to complete the antibiotics and let us know how she is doing after completing.

## 2015-06-15 ENCOUNTER — Ambulatory Visit: Payer: Medicare Other | Admitting: Family Medicine

## 2015-06-15 DIAGNOSIS — R350 Frequency of micturition: Secondary | ICD-10-CM | POA: Diagnosis not present

## 2015-06-16 ENCOUNTER — Ambulatory Visit (INDEPENDENT_AMBULATORY_CARE_PROVIDER_SITE_OTHER): Payer: Medicare Other | Admitting: Family Medicine

## 2015-06-16 ENCOUNTER — Encounter: Payer: Self-pay | Admitting: Family Medicine

## 2015-06-16 VITALS — Ht 63.0 in | Wt 119.8 lb

## 2015-06-16 DIAGNOSIS — R634 Abnormal weight loss: Secondary | ICD-10-CM | POA: Diagnosis not present

## 2015-06-16 DIAGNOSIS — R7303 Prediabetes: Secondary | ICD-10-CM | POA: Diagnosis not present

## 2015-06-16 NOTE — Progress Notes (Signed)
Medical Nutrition Therapy:  Appt start time: 1430 end time:  [id number].  Assessment:  Primary concerns today: Blood sugar control and unintentional weight loss. (R73.03 & R63.4) Ms. Stepka has started drinking Pediasure about 3 X wk as a way of getting more kcal.  Was using it more, but it is too expensive for her to use daily.  She always eats breakfast and dinner, but seldom has a mid-day meal; works at [employer] from to 9 to 1:30 PM.  She does not like most vegetables, and does not do a lot of home preparation.   She again inquired about a pill of some sort she thought she'd heard of that helps people gain weight.  I told her I am not familiar with this.    24-hr recall:  (Up at 7 AM) B (8 AM)-  2 c [place] (?) cereal, 1 c 1% Lactaid milk Snk (10 AM)-  1 c fruit Jell-O L ( PM)-  --- Snk ( PM)-  --- D (5 PM)-  4 chx wings, 1/2 c brn rice, 1 c baked beans, water Snk ( PM)-  --- Typical day? No.  Learning Readiness: Ready  Barriers to learning/adherence to lifestyle change: Nutrition knowledge deficit.  Another barrier to adherence is poor dental health; dentures are uncomfortable, so she never wears them.  Consequently, cannot chew many veg's/fruits/meats.    Progress Towards Goal(s):  In progress.   Nutritional Diagnosis:  No progress on: [place]-3.2 Unintentional weight loss As related to poor eating pattern.  As evidenced by erratic eating schedule that includes no more than 2 real meals /day.    Intervention:  Nutrition education.  Handouts given during visit include:  AVS  Demonstrated degree of understanding via:  Teach Back   Monitoring/Evaluation:  Dietary intake, exercise, and body weight in 4 week(s).

## 2015-06-16 NOTE — Patient Instructions (Addendum)
-   If you want a nutritional drink, ask the pharmacist at [facility] or [facility] or [facility] for the store brand of Ensure or Boost to help with weight gain.   - Vegetables you like:  Peas, broccoli, snap beans, spinach.   - Meals Plan: - Breakfast Options:    - Cereal with Lactaid milk (the best cereals have at least 5 grams of fiber per serving).  - 2 Eggs, 2 whole-wheat toast, friut  - Dinner leftovers  - Toast with peanut butter or cheese  - Grits or oatmeal, fruit, yogurt (look for low-fat, not fat-free, not sugar-free) - Lunch or Dinner:  Include some protein food, a starch food, and some vegetables or fruit.  Choose at least one from each column:  Protein Food   Starch Food   Veg or Fruit   Chicken   Rice    Banana  Pork chop   Sweet potato   Spinach  Flounder   Roll      Fruit cocktail  Other fish   Bread    Peas  Steak    White potato   Carrots  Hamburger   Pasta    Grapes  Hot dogs   Tortillas    Orange  Baloney   Crackers   Peaches  Ham         Watermelon  [place]         Cantaloupe Peanut butter       Broccoli Cheese        Snap beans Yogurt

## 2015-06-24 DIAGNOSIS — N7689 Other specified inflammation of vagina and vulva: Secondary | ICD-10-CM | POA: Diagnosis not present

## 2015-06-24 DIAGNOSIS — R350 Frequency of micturition: Secondary | ICD-10-CM | POA: Diagnosis not present

## 2015-06-24 DIAGNOSIS — N771 Vaginitis, vulvitis and vulvovaginitis in diseases classified elsewhere: Secondary | ICD-10-CM | POA: Diagnosis not present

## 2015-06-24 DIAGNOSIS — R6882 Decreased libido: Secondary | ICD-10-CM | POA: Diagnosis not present

## 2015-06-24 DIAGNOSIS — R399 Unspecified symptoms and signs involving the genitourinary system: Secondary | ICD-10-CM | POA: Diagnosis not present

## 2015-06-29 ENCOUNTER — Other Ambulatory Visit: Payer: Self-pay | Admitting: Family Medicine

## 2015-06-30 ENCOUNTER — Other Ambulatory Visit: Payer: Self-pay | Admitting: *Deleted

## 2015-06-30 NOTE — Telephone Encounter (Signed)
Pt called to see if the doctor ad called in her Amlodipine. She has one tablet left. jw

## 2015-06-30 NOTE — Telephone Encounter (Signed)
Duplicate

## 2015-06-30 NOTE — Telephone Encounter (Signed)
What is the request for? There is no medication attached to this request.  Leeanne Rio, MD

## 2015-06-30 NOTE — Telephone Encounter (Signed)
Request was originally for amlodipine.

## 2015-06-30 NOTE — Telephone Encounter (Signed)
Patient called to ask for this refill also. Jazmin Hartsell,CMA

## 2015-07-01 MED ORDER — AMLODIPINE BESYLATE 5 MG PO TABS: 5.0000 mg | ORAL_TABLET | Freq: Every day | ORAL | Status: DC

## 2015-07-03 ENCOUNTER — Encounter: Payer: Self-pay | Admitting: Family Medicine

## 2015-07-03 ENCOUNTER — Ambulatory Visit (INDEPENDENT_AMBULATORY_CARE_PROVIDER_SITE_OTHER): Payer: Medicare Other | Admitting: Family Medicine

## 2015-07-03 ENCOUNTER — Ambulatory Visit: Payer: Medicare Other | Admitting: Family Medicine

## 2015-07-03 ENCOUNTER — Other Ambulatory Visit: Payer: Self-pay | Admitting: Gastroenterology

## 2015-07-03 VITALS — BP 146/62 | HR 109 | Temp 98.6°F | Ht 63.0 in | Wt 119.0 lb

## 2015-07-03 DIAGNOSIS — R350 Frequency of micturition: Secondary | ICD-10-CM

## 2015-07-03 DIAGNOSIS — R109 Unspecified abdominal pain: Secondary | ICD-10-CM

## 2015-07-03 DIAGNOSIS — R748 Abnormal levels of other serum enzymes: Secondary | ICD-10-CM

## 2015-07-03 DIAGNOSIS — R634 Abnormal weight loss: Secondary | ICD-10-CM

## 2015-07-03 DIAGNOSIS — R358 Other polyuria: Secondary | ICD-10-CM

## 2015-07-03 DIAGNOSIS — R3589 Other polyuria: Secondary | ICD-10-CM

## 2015-07-03 DIAGNOSIS — R799 Abnormal finding of blood chemistry, unspecified: Secondary | ICD-10-CM | POA: Diagnosis not present

## 2015-07-03 DIAGNOSIS — R7989 Other specified abnormal findings of blood chemistry: Secondary | ICD-10-CM

## 2015-07-03 LAB — POCT URINALYSIS DIPSTICK
Bilirubin, UA: NEGATIVE
GLUCOSE UA: NEGATIVE
Ketones, UA: NEGATIVE
NITRITE UA: NEGATIVE
PROTEIN UA: NEGATIVE
RBC UA: NEGATIVE
SPEC GRAV UA: 1.02
UROBILINOGEN UA: 0.2
pH, UA: 5.5

## 2015-07-03 LAB — POCT UA - MICROSCOPIC ONLY

## 2015-07-03 MED ORDER — MIRTAZAPINE 15 MG PO TABS: 15.0000 mg | ORAL_TABLET | Freq: Every day | ORAL | Status: DC

## 2015-07-03 NOTE — Telephone Encounter (Signed)
When did she complete the Rifaximin? If it has been at least a few weeks we can repeat another 2 week course if she found significant benefit from it.

## 2015-07-03 NOTE — Patient Instructions (Addendum)
Checking kidney function again today Also checking for diabetes Start remeron to help with mood and weight gain - sent to your pharmacy  Follow up with me in 1 month  Be well, Dr. Ardelia Mems

## 2015-07-03 NOTE — Telephone Encounter (Signed)
Dr. Havery Moros,   I spoke to this patient and she states she completed the Xifaxan and felt better but now she is having gas pains and pressure again. Could she have another course of Xifaxan or what do you recommend?  Thanks,   [employer]

## 2015-07-04 LAB — BASIC METABOLIC PANEL
BUN: 19 mg/dL (ref 7–25)
CHLORIDE: 98 mmol/L (ref 98–110)
CO2: 27 mmol/L (ref 20–31)
Calcium: 10.1 mg/dL (ref 8.6–10.4)
Creat: 1.06 mg/dL — ABNORMAL HIGH (ref 0.50–1.05)
Glucose, Bld: 99 mg/dL (ref 65–99)
POTASSIUM: 4.1 mmol/L (ref 3.5–5.3)
SODIUM: 136 mmol/L (ref 135–146)

## 2015-07-06 MED ORDER — RIFAXIMIN 550 MG PO TABS: 550.0000 mg | ORAL_TABLET | Freq: Three times a day (TID) | ORAL | Status: DC

## 2015-07-06 NOTE — Telephone Encounter (Signed)
Spoke with Angelia and she said she finished the last course of xifaxan several weeks ago and it did help a lot.  Per Dr Antonieta Pert sent in another round to [facility].

## 2015-07-07 ENCOUNTER — Telehealth: Payer: Self-pay | Admitting: *Deleted

## 2015-07-07 NOTE — Progress Notes (Signed)
Date of Visit: 07/03/2015   HPI:  Patient presents today to discuss concern that something's wrong with her kidneys  Notes that she has urinary frequency and pressure. Was seen at her OB/GYN's office recently on 3/12 and had urine culture drawn that showed no growth. Also had BMET drawn which showed creatinine of 1.09. Was told her kidney fucntion was decreased and that she should follow up with me. Denies dysuria. No fevers.   Stress - worried about how she's not gained weight. Feels lots of stress from this. Willing to take medication to help her gain weight, actually requests this. Also would be happy to take medication to help with stress. Denies prior manic symptoms in the past.   ROS: See HPI.  [place]: history of hyperlipidemia, sickle cell trait, hypertension, gerd,   PHYSICAL EXAM: BP 146/62 mmHg  Pulse 109  Temp(Src) 98.6 F (37 C) (Oral)  Ht 5\' 3"  (1.6 m)  Wt 119 lb (53.978 kg)  BMI 21.09 kg/m2 Gen: NAD, pleasant, cooperative, well appearing, anxious HEENT: normocephalic, atraumatic, mmm Heart:  Regular rate and rhythm  Lungs: clear to auscultation bilaterally, normal work of breathing  Abdomen: soft, nontender to palpation. No masses or organomegaly Back: no cva tenderness bilaterally Neuro: alert, grossly nonfocal, speech normal Ext: No appreciable lower extremity edema bilaterally   ASSESSMENT/PLAN:  Weight loss Continue to follow up with nutrition Add remeron to help with weight gain and stress Follow up with me in 2 weeks to see how she's doing on this medication.   Elevated serum creatinine Creatinine mildly elevated at recent OB/GYn visit Urinalysis today not suggestive of infection Will repeat bmet today. Offered reassurance to patient.    FOLLOW UP: Follow up in 2 weeks for weight and mood  [place] J. Ardelia Mems, [place]

## 2015-07-07 NOTE — Telephone Encounter (Signed)
Requesting the results of her bloodwork on Friday. Regina Sawyer, [name], CMA

## 2015-07-08 NOTE — Assessment & Plan Note (Signed)
Creatinine mildly elevated at recent OB/GYn visit Urinalysis today not suggestive of infection Will repeat bmet today. Offered reassurance to patient.

## 2015-07-08 NOTE — Telephone Encounter (Signed)
Will forward to MD. Chrissie Dacquisto,CMA  

## 2015-07-08 NOTE — Telephone Encounter (Signed)
Pt called again about test results  °

## 2015-07-08 NOTE — Telephone Encounter (Signed)
Returned call to patient and let her know that labs looked good. A1c was not done but I am not concerned about that as her glucose was normal. Patient appreciated the call.  Leeanne Rio, MD

## 2015-07-08 NOTE — Assessment & Plan Note (Signed)
Continue to follow up with nutrition Add remeron to help with weight gain and stress Follow up with me in 2 weeks to see how she's doing on this medication.

## 2015-07-13 ENCOUNTER — Telehealth: Payer: Self-pay | Admitting: Gastroenterology

## 2015-07-13 NOTE — Telephone Encounter (Signed)
Patient got xifaxan Friday according to [employer].

## 2015-07-20 ENCOUNTER — Encounter: Payer: Self-pay | Admitting: Family Medicine

## 2015-07-20 ENCOUNTER — Ambulatory Visit (INDEPENDENT_AMBULATORY_CARE_PROVIDER_SITE_OTHER): Payer: Medicare Other | Admitting: Family Medicine

## 2015-07-20 VITALS — BP 134/62 | HR 91 | Temp 98.8°F | Ht 63.0 in | Wt 132.0 lb

## 2015-07-20 DIAGNOSIS — R634 Abnormal weight loss: Secondary | ICD-10-CM | POA: Diagnosis not present

## 2015-07-20 MED ORDER — MIRTAZAPINE 15 MG PO TABS: 15.0000 mg | ORAL_TABLET | Freq: Every day | ORAL | Status: DC

## 2015-07-20 NOTE — Progress Notes (Signed)
Date of Visit: 07/20/2015   HPI:  Patient presents to follow up on weight gain and mood after starting remeron. Reports she's been doing well. Appetite has increased a lot. Has gained 17lb since I saw her a few weeks ago. Mood is good. Denies SI/HI. No longer feels anxious about weight.  ROS: See HPI.  [place]: history of iron deficiency anemia, anxiety/depression, GERD, hyperlipidemia, hypertension, sickle cell trait  PHYSICAL EXAM: BP 134/62 mmHg  Pulse 91  Temp(Src) 98.8 F (37.1 C) (Oral)  Ht 5\' 3"  (1.6 m)  Wt 132 lb (59.875 kg)  BMI 23.39 kg/m2 Gen: NAD, pleasant, cooperative HEENT: normocephalic, atraumatic  Abdomen: soft nontender to palpation   ASSESSMENT/PLAN:  Weight loss Improved on remeron. Weight up 16lb. Tolerating remeron well. Continue present dose. Follow up with me in 1 month.    FOLLOW UP: Follow up in 1 month for weight gain  [place] J. Ardelia Mems, [place]

## 2015-07-20 NOTE — Patient Instructions (Signed)
Glad you're gaining weight Follow up with me in 1 month  Be well, Dr. Ardelia Mems

## 2015-07-22 ENCOUNTER — Encounter (HOSPITAL_COMMUNITY): Payer: Self-pay | Admitting: Emergency Medicine

## 2015-07-22 ENCOUNTER — Ambulatory Visit (HOSPITAL_COMMUNITY)
Admission: EM | Admit: 2015-07-22 | Discharge: 2015-07-22 | Disposition: A | Discharge status: Home / Self Care | Payer: Medicare Other | Attending: Family Medicine | Admitting: Family Medicine

## 2015-07-22 DIAGNOSIS — M898X1 Other specified disorders of bone, shoulder: Secondary | ICD-10-CM | POA: Diagnosis not present

## 2015-07-22 MED ORDER — TRAMADOL HCL 50 MG PO TABS: 50.0000 mg | ORAL_TABLET | Freq: Two times a day (BID) | ORAL | Status: DC | PRN

## 2015-07-22 NOTE — Discharge Instructions (Signed)
It is a pleasure to see you today.   I believe you have a muscle strain under your left shoulder blade.   Warm compresses frequently throughout the day.   Tramadol 50mg  take 1 tablet by mouth every 12 hours as needed for extreme pain.

## 2015-07-22 NOTE — Assessment & Plan Note (Signed)
Improved on remeron. Weight up 16lb. Tolerating remeron well. Continue present dose. Follow up with me in 1 month.

## 2015-07-22 NOTE — ED Provider Notes (Addendum)
CSN: [id number]     Arrival date & time 07/22/15  1527 History   None    Chief Complaint  Patient presents with  . Back Pain   (Consider location/radiation/quality/duration/timing/severity/associated sxs/prior Treatment) Patient is a 60 y.o. female presenting with back pain. The history is provided by the patient. No language interpreter was used.  Back Pain Associated symptoms: no chest pain and no fever   Patient presents for complaint of pain below the L scapula, began about 5 days ago.  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  No radiation of the pain to her neck, arm, or abdomen/back.  No falls or other injuries. Has taken Tylenol without relief.  Has not missed work because of this pain.   She gives a history of gastric ulcers, unable to take NSAIDs for this reason.  She denies history of seizures in the past. Has taken Tramadol for pain, has provided relief.   Past Medical History  Diagnosis Date  . Hypertension   . GERD (gastroesophageal reflux disease)   . Heart murmur   . Ulcer   . Depression   . History of bacterial infection   . Sickle cell trait ([place])   . Tenderness of female pelvic organs 2005  . H/O vaginitis 2005  . Fibroid 2005  . BV (bacterial vaginosis) 2005  . Candida vaginitis 2006  . Libido, decreased 2006  . Female pelvic pain 2006  . Dysuria 2007  . Irregular periods/menstrual cycles 2007  . Menopausal symptoms 2007  . Hx: UTI (urinary tract infection) 2007  . H/O dyspareunia 2008  . Proteinuria 2009  . Nocturia 2009  . Candida vaginitis 2009  . Atrophic vaginitis 2009  . H/O constipation 2009  . History of hemorrhoids 2009  . Vaginal irritation 2011  . H/O urinary frequency 2011  . Asthma   . Hyperlipidemia    Past Surgical History  Procedure Laterality Date  . Carpal tunnel release      both wrists  . Tubal ligation    .  Esophagogastroduodenoscopy N/A 08/06/2014    Procedure: ESOPHAGOGASTRODUODENOSCOPY (EGD);  Surgeon: Inda Castle, MD;  Location: [facility];  Service: Endoscopy;  Laterality: N/A;  . Colonoscopy N/A 10/28/2014    Procedure: COLONOSCOPY;  Surgeon: Inda Castle, MD;  Location: [facility];  Service: Endoscopy;  Laterality: N/A;   Family History  Problem Relation Age of Onset  . Diabetes Sister   . Hypertension Sister   . Diabetes Other   . Colon cancer Father   . Colon polyps Neg Hx   . Kidney disease Neg Hx   . Esophageal cancer Neg Hx   . Heart failure Brother     x2  . Gallbladder disease Neg Hx   . Gallstones Mother    Social History  Substance Use Topics  . Smoking status: Passive Smoke Exposure - Never Smoker  . Smokeless tobacco: Never Used  . Alcohol Use: No   OB History    Gravida Para Term Preterm AB TAB SAB Ectopic Multiple Living   2 2 2  0 0 0 0 0 0 2     Review of Systems  Constitutional: Negative for fever, chills, diaphoresis, appetite change and fatigue.  Respiratory: Negative for cough, chest tightness and shortness of breath.   Cardiovascular: Negative for chest pain.  Musculoskeletal: Positive for back pain.    Allergies  Atorvastatin and Latex  Home Medications   Prior to Admission medications   Medication Sig Start Date End Date Taking? Authorizing Provider  amLODipine (NORVASC) 5 MG tablet Take 1 tablet (5 mg total) by mouth daily. 07/01/15  Yes Leeanne Rio, MD  dexlansoprazole (DEXILANT) 60 MG capsule Take 1 capsule (60 mg total) by mouth daily. 11/21/14  Yes Inda Castle, MD  mirtazapine (REMERON) 15 MG tablet Take 1 tablet (15 mg total) by mouth at bedtime. 07/20/15  Yes Leeanne Rio, MD  Multiple Vitamins-Minerals (MULTIVITAMIN PO) Take 1 tablet by mouth daily.    Historical Provider, MD  PEDIASURE (PEDIASURE) LIQD Take 237 mLs by mouth 3 (three) times a week.    Historical Provider, MD  rifaximin (XIFAXAN) 550 MG TABS  tablet Take 1 tablet (550 mg total) by mouth 3 (three) times daily. 07/06/15   Manus Gunning, MD  traMADol (ULTRAM) 50 MG tablet Take 1 tablet (50 mg total) by mouth every 12 (twelve) hours as needed. 07/22/15   Willeen Niece, MD   Meds Ordered and Administered this Visit  Medications - No data to display  BP 136/68 mmHg  Pulse 91  Temp(Src) 98.7 F (37.1 C) (Oral)  Resp 16  SpO2 100%  LMP  (Approximate) No data found.   Physical Exam  Constitutional: She appears well-developed and well-nourished. No distress.  Neck: Neck supple.  Cardiovascular: Normal rate and regular rhythm.   Pulmonary/Chest: Effort normal and breath sounds normal. No respiratory distress. She has no wheezes. She has no rales. She exhibits no tenderness.  Musculoskeletal:  Point tenderness at point just inferior to the left scapula. No skin changes or vesicular lesions/erythema.   Full active ROM bilat shoulders/UEs.  Hand grip full and symmetric. Sensation in distal fingers full and symmetric. Palpable radial pulses bilaterally.   Able to perform empty can test, Neer and Hawkins intact on L.     Skin: She is not diaphoretic.    ED Course  Procedures (including critical care time)  Labs Review Labs Reviewed - No data to display  Imaging Review No results found.   Visual Acuity Review  Right Eye Distance:   Left Eye Distance:   Bilateral Distance:    Right Eye Near:   Left Eye Near:    Bilateral Near:         MDM   1. Pain of left scapula    MSK pain under L scapula. Patient reports GI intolerance to NSAIDs with history of ulcers.  Tramadol and warm compresses prn. For follow up with primary doctor if not improving.   Dalbert Mayotte, MD    Willeen Niece, MD 07/22/15 [place], MD 07/22/15 [phone]

## 2015-07-22 NOTE — ED Notes (Signed)
The patient presented to the [facility] with a complaint of left side back pain secondary to picking up a heavy object 4 days ago.

## 2015-07-23 ENCOUNTER — Telehealth: Payer: Self-pay | Admitting: Family Medicine

## 2015-07-23 MED ORDER — MIRTAZAPINE 15 MG PO TABS: 15.0000 mg | ORAL_TABLET | Freq: Every day | ORAL | Status: DC

## 2015-07-23 MED ORDER — AMLODIPINE BESYLATE 5 MG PO TABS: 5.0000 mg | ORAL_TABLET | Freq: Every day | ORAL | Status: DC

## 2015-07-23 NOTE — Telephone Encounter (Signed)
Pt called and needs refills on her Mirtazapine and Amlodipine called in. jw

## 2015-07-23 NOTE — Telephone Encounter (Signed)
Rx sent in. Thanks! Anayla Giannetti J Deagan Sevin, MD  

## 2015-07-28 ENCOUNTER — Ambulatory Visit: Payer: Medicare Other | Admitting: Family Medicine

## 2015-08-05 ENCOUNTER — Ambulatory Visit (INDEPENDENT_AMBULATORY_CARE_PROVIDER_SITE_OTHER): Payer: Medicare Other | Admitting: Podiatry

## 2015-08-05 ENCOUNTER — Encounter: Payer: Self-pay | Admitting: Podiatry

## 2015-08-05 DIAGNOSIS — M79645 Pain in left finger(s): Secondary | ICD-10-CM | POA: Diagnosis not present

## 2015-08-05 DIAGNOSIS — M779 Enthesopathy, unspecified: Secondary | ICD-10-CM | POA: Diagnosis not present

## 2015-08-05 DIAGNOSIS — M79671 Pain in right foot: Secondary | ICD-10-CM

## 2015-08-05 MED ORDER — TRIAMCINOLONE ACETONIDE 10 MG/ML IJ SUSP
10.0000 mg | Freq: Once | INTRAMUSCULAR | Status: AC
  Administered 2015-08-05: 10 mg

## 2015-08-06 NOTE — Progress Notes (Signed)
Subjective:     Patient ID: Regina Sawyer, female   DOB: 03/12/1956, 60 y.o.   MRN: [id number]  HPI patient states she started to develop a lot of pain on top of her right foot over the last month   Review of Systems     Objective:   Physical Exam Neurovascular status intact muscle strength adequate with patient found to have quite a bit of discomfort in the extensor tendon complex right    Assessment:     Extensor tendinitis right with inflammation of the tendon group    Plan:     Reviewed condition and did careful injection of the tendon complex right 3 mg Kenalog 5 mg Xylocaine advised on heat ice therapy and not wearing tight-type shoes

## 2015-08-11 ENCOUNTER — Ambulatory Visit: Payer: Medicare Other | Admitting: Family Medicine

## 2015-08-12 DIAGNOSIS — N771 Vaginitis, vulvitis and vulvovaginitis in diseases classified elsewhere: Secondary | ICD-10-CM | POA: Diagnosis not present

## 2015-08-12 DIAGNOSIS — N7689 Other specified inflammation of vagina and vulva: Secondary | ICD-10-CM | POA: Diagnosis not present

## 2015-08-21 ENCOUNTER — Ambulatory Visit: Payer: Medicare Other | Admitting: Family Medicine

## 2015-08-24 ENCOUNTER — Other Ambulatory Visit: Payer: Self-pay | Admitting: Gastroenterology

## 2015-09-02 ENCOUNTER — Other Ambulatory Visit: Payer: Self-pay | Admitting: Family Medicine

## 2015-09-02 MED ORDER — MIRTAZAPINE 15 MG PO TABS: 15.0000 mg | ORAL_TABLET | Freq: Every day | ORAL | Status: DC

## 2015-09-02 MED ORDER — AMLODIPINE BESYLATE 5 MG PO TABS: 5.0000 mg | ORAL_TABLET | Freq: Every day | ORAL | Status: DC

## 2015-09-06 ENCOUNTER — Encounter (HOSPITAL_COMMUNITY): Payer: Self-pay | Admitting: Emergency Medicine

## 2015-09-06 ENCOUNTER — Ambulatory Visit (HOSPITAL_COMMUNITY)
Admission: EM | Admit: 2015-09-06 | Discharge: 2015-09-06 | Disposition: A | Discharge status: Home / Self Care | Payer: Worker's Compensation | Attending: Emergency Medicine | Admitting: Emergency Medicine

## 2015-09-06 DIAGNOSIS — L03116 Cellulitis of left lower limb: Secondary | ICD-10-CM

## 2015-09-06 DIAGNOSIS — Z77098 Contact with and (suspected) exposure to other hazardous, chiefly nonmedicinal, chemicals: Secondary | ICD-10-CM

## 2015-09-06 MED ORDER — CLINDAMYCIN HCL 300 MG PO CAPS: 300.0000 mg | ORAL_CAPSULE | Freq: Three times a day (TID) | ORAL | Status: DC

## 2015-09-06 NOTE — ED Provider Notes (Signed)
CSN: [id number]     Arrival date & time 09/06/15  1331 History   First MD Initiated Contact with Patient 09/06/15 1542     Chief Complaint  Patient presents with  . Leg Pain   (Consider location/radiation/quality/duration/timing/severity/associated sxs/prior Treatment) HPI Comments: 60 year old female presents to the urgent care with complaints of swelling and redness, pain and tenderness to the left leg. Lesser to the right leg. She states she was at work and some type of chemical was accidentally sprayed onto her left leg. It was a full day later until she cleaned it off. The next day she noticed a smooth area of erythema with swelling and tenderness primarily to the medial and posterior calf involving the lower half to two thirds of the left lower extremity. She is insisting that the reason for the signs and symptoms are from chemical contact. There is no sign of a rash or other dermatitis.  After waiting for. At time we were able to obtain a MSDS sheet for the chemical which was sprayed onto her leg. The trade name is Cytogeneticist which contains accommodation of hyper chloride and sodium hydroxide. It is substantially diluted to be used for cleaning. The treatment is to clean the area with soap and water immediately. The usual reaction to this is severe burns, skin degradation, vesicle formation, ulcerations and deep erythema. According to the first aid measures after cleaning the no to physician states that after cleaning the area is treated symptomatically and supportively there is no direct antidote.   Past Medical History  Diagnosis Date  . Hypertension   . GERD (gastroesophageal reflux disease)   . Heart murmur   . Ulcer   . Depression   . History of bacterial infection   . Sickle cell trait ([place])   . Tenderness of female pelvic organs 2005  . H/O vaginitis 2005  . Fibroid 2005  . BV (bacterial vaginosis) 2005  . Candida vaginitis 2006  . Libido, decreased 2006  . Female pelvic  pain 2006  . Dysuria 2007  . Irregular periods/menstrual cycles 2007  . Menopausal symptoms 2007  . Hx: UTI (urinary tract infection) 2007  . H/O dyspareunia 2008  . Proteinuria 2009  . Nocturia 2009  . Candida vaginitis 2009  . Atrophic vaginitis 2009  . H/O constipation 2009  . History of hemorrhoids 2009  . Vaginal irritation 2011  . H/O urinary frequency 2011  . Asthma   . Hyperlipidemia    Past Surgical History  Procedure Laterality Date  . Carpal tunnel release      both wrists  . Tubal ligation    . Esophagogastroduodenoscopy N/A 08/06/2014    Procedure: ESOPHAGOGASTRODUODENOSCOPY (EGD);  Surgeon: Inda Castle, MD;  Location: [facility];  Service: Endoscopy;  Laterality: N/A;  . Colonoscopy N/A 10/28/2014    Procedure: COLONOSCOPY;  Surgeon: Inda Castle, MD;  Location: [facility];  Service: Endoscopy;  Laterality: N/A;   Family History  Problem Relation Age of Onset  . Diabetes Sister   . Hypertension Sister   . Diabetes Other   . Colon cancer Father   . Colon polyps Neg Hx   . Kidney disease Neg Hx   . Esophageal cancer Neg Hx   . Heart failure Brother     x2  . Gallbladder disease Neg Hx   . Gallstones Mother    Social History  Substance Use Topics  . Smoking status: Passive Smoke Exposure - Never Smoker  . Smokeless tobacco: Never  Used  . Alcohol Use: No   OB History    Gravida Para Term Preterm AB TAB SAB Ectopic Multiple Living   2 2 2  0 0 0 0 0 0 2     Review of Systems  Constitutional: Negative for fever, activity change, appetite change and fatigue.  HENT: Negative.   Respiratory: Negative.   Gastrointestinal: Negative.   Skin: Positive for color change. Negative for wound.  All other systems reviewed and are negative.   Allergies  Atorvastatin and Latex  Home Medications   Prior to Admission medications   Medication Sig Start Date End Date Taking? Authorizing Provider  amLODipine (NORVASC) 5 MG tablet Take 1 tablet (5 mg  total) by mouth daily. 09/02/15   Leeanne Rio, MD  clindamycin (CLEOCIN) 300 MG capsule Take 1 capsule (300 mg total) by mouth 3 (three) times daily. 09/06/15   Janne Napoleon, NP  dexlansoprazole (DEXILANT) 60 MG capsule Take 1 capsule (60 mg total) by mouth daily. 11/21/14   Inda Castle, MD  mirtazapine (REMERON) 15 MG tablet Take 1 tablet (15 mg total) by mouth at bedtime. 09/02/15   Leeanne Rio, MD  Multiple Vitamins-Minerals (MULTIVITAMIN PO) Take 1 tablet by mouth daily.    Historical Provider, MD  PEDIASURE (PEDIASURE) LIQD Take 237 mLs by mouth 3 (three) times a week.    Historical Provider, MD  rifaximin (XIFAXAN) 550 MG TABS tablet Take 1 tablet (550 mg total) by mouth 3 (three) times daily. 07/06/15   Manus Gunning, MD  traMADol (ULTRAM) 50 MG tablet Take 1 tablet (50 mg total) by mouth every 12 (twelve) hours as needed. 07/22/15   Willeen Niece, MD   Meds Ordered and Administered this Visit  Medications - No data to display  BP 155/63 mmHg  Pulse 82  Temp(Src) 98.9 F (37.2 C) (Oral)  Resp 20  SpO2 100%  LMP  (Approximate) No data found.   Physical Exam  Constitutional: She is oriented to person, place, and time. She appears well-developed and well-nourished. No distress.  Eyes: EOM are normal.  Neck: Normal range of motion. Neck supple.  Cardiovascular: Normal rate.   Pulmonary/Chest: Effort normal.  Musculoskeletal:  1+ pitting edema to the left lower extremity below the knee involving the lower one half to two thirds the lower extremity. There is mild swelling and smooth well marginated erythema to the medial and posterior left calf. This area is tender to palpation. No dermatitis. This scan is smooth and mildly tense. No papules, macules or other lesions are seen. Mild swelling to the ankle and proximal foot. Distal neurovascular motor Sentry is intact. There are 2 or 3 small cutaneous blotches of erythema to the skin of the right medial lower leg.   Neurological: She is alert and oriented to person, place, and time. She exhibits normal muscle tone.  Skin: Skin is warm and dry.  Psychiatric: She has a normal mood and affect.  Nursing note and vitals reviewed.   ED Course  Procedures (including critical care time)  Labs Review Labs Reviewed - No data to display  Imaging Review No results found.   Visual Acuity Review  Right Eye Distance:   Left Eye Distance:   Bilateral Distance:    Right Eye Near:   Left Eye Near:    Bilateral Near:         MDM   1. Cellulitis of left lower extremity   2. Occupational exposure to chemicals  Although the patient is insistent that this is a chemical skin reaction I believe this to be unlikely. As above there is no dermatitis. No defect in the skin, no lesions. The skin is smooth and slightly shiny due to the swelling. It appears to be more like a early cellulitis. Follow-up with your PCP tomorrow.  The skin is intact. The erythema does not appear to be in a pattern in which a chemical has been sprayed or splashed onto the leg as the patient states. This is a well marginated area of erythema with increased warmth and swelling consistent with a type of cellulitis. She will be treated with clindamycin and topical need be a cream for comfort. She is to follow-up with her PCP in 2 days. For any worsening, new symptoms or problems, blistering, skin distraction, rash, red streaks, increased pain or swelling she is to go directly to the emergency department.  Janne Napoleon, NP 09/06/15 [phone]

## 2015-09-06 NOTE — Discharge Instructions (Signed)
Cellulitis See your doctor in 2 days Cellulitis is an infection of the skin and the tissue beneath it. The infected area is usually red and tender. Cellulitis occurs most often in the arms and lower legs.  CAUSES  Cellulitis is caused by bacteria that enter the skin through cracks or cuts in the skin. The most common types of bacteria that cause cellulitis are staphylococci and streptococci. SIGNS AND SYMPTOMS   Redness and warmth.  Swelling.  Tenderness or pain.  Fever. DIAGNOSIS  Your health care provider can usually determine what is wrong based on a physical exam. Blood tests may also be done. TREATMENT  Treatment usually involves taking an antibiotic medicine. HOME CARE INSTRUCTIONS   Take your antibiotic medicine as directed by your health care provider. Finish the antibiotic even if you start to feel better.  Keep the infected arm or leg elevated to reduce swelling.  Apply a warm cloth to the affected area up to 4 times per day to relieve pain.  Take medicines only as directed by your health care provider.  Keep all follow-up visits as directed by your health care provider. SEEK MEDICAL CARE IF:   You notice red streaks coming from the infected area.  Your red area gets larger or turns dark in color.  Your bone or joint underneath the infected area becomes painful after the skin has healed.  Your infection returns in the same area or another area.  You notice a swollen bump in the infected area.  You develop new symptoms.  You have a fever. SEEK IMMEDIATE MEDICAL CARE IF:   You feel very sleepy.  You develop vomiting or diarrhea.  You have a general ill feeling (malaise) with muscle aches and pains.   This information is not intended to replace advice given to you by your health care provider. Make sure you discuss any questions you have with your health care provider.   Document Released: 01/12/2005 Document Revised: 12/24/2014 Document Reviewed:  06/20/2011 Elsevier Interactive Patient Education 2016 [place].  May apply Nivea cream for cooling effect.
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
## 2015-09-07 ENCOUNTER — Encounter: Payer: Self-pay | Admitting: Internal Medicine

## 2015-09-07 ENCOUNTER — Ambulatory Visit (INDEPENDENT_AMBULATORY_CARE_PROVIDER_SITE_OTHER): Payer: Medicare Other | Admitting: Internal Medicine

## 2015-09-07 ENCOUNTER — Ambulatory Visit (HOSPITAL_COMMUNITY)
Admission: RE | Admit: 2015-09-07 | Discharge: 2015-09-07 | Disposition: A | Discharge status: Home / Self Care | Payer: Medicare Other | Source: Ambulatory Visit | Attending: Family Medicine | Admitting: Family Medicine

## 2015-09-07 VITALS — BP 137/76 | HR 101 | Temp 98.9°F | Wt 147.7 lb

## 2015-09-07 DIAGNOSIS — L539 Erythematous condition, unspecified: Secondary | ICD-10-CM | POA: Insufficient documentation

## 2015-09-07 MED ORDER — CIPROFLOXACIN HCL 500 MG PO TABS: 500.0000 mg | ORAL_TABLET | Freq: Two times a day (BID) | ORAL | Status: DC

## 2015-09-07 NOTE — Patient Instructions (Signed)
It was nice meeting you today.   Please take the clindamycin as it was prescribed at Urgent Care. Also, please begin taking ciprofloxacin twice a day for the next ten days.   Please go directly to the [facility] emergency room to have an ultrasound of your leg performed. This will help [place] determine if you have a blood clot in your leg or not.   If you have any questions or concerns, feel free to call the office.   Be well,  Dr. Avon Gully

## 2015-09-07 NOTE — Progress Notes (Signed)
VASCULAR LAB PRELIMINARY  PRELIMINARY  PRELIMINARY  PRELIMINARY  Left lower extremity venous duplex completed.    Preliminary report:  Left:  No evidence of DVT, superficial thrombosis, or Baker's cyst.  Mikaylah Libbey, RVT 09/07/2015, 6:00 PM

## 2015-09-07 NOTE — Assessment & Plan Note (Signed)
Differential includes cellulitis, reaction to chemical exposure, DVT, and thrombophlebitis. Cellulitis high on differential, given physical exam findings of erythema, edema, and tenderness. DVT and thrombophlebitis also high on differential, although no palpable cords and negative Homan's sign. Also no recent risk factors for DVT. Given lack of ulcerations, vesicle formation, and other reactions listed on chemical MSDS, reaction to chemical exposure alone is less likely cause of current condition.  - Continue clindamycin - Add cipro to broaden coverage (will include Pseudomonas coverage as patient has history of Type II DM) - Ordered LE Doppler to assess for DVT. Patient instructed to go immediately to [facility] ED to have US performed.

## 2015-09-07 NOTE — Addendum Note (Signed)
Addended by: Adin Hector on: 09/07/2015 05:00 PM   Modules accepted: Orders
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
## 2015-09-10 ENCOUNTER — Encounter: Payer: Self-pay | Admitting: Family Medicine

## 2015-09-10 ENCOUNTER — Ambulatory Visit (INDEPENDENT_AMBULATORY_CARE_PROVIDER_SITE_OTHER): Payer: Medicare Other | Admitting: Family Medicine

## 2015-09-10 VITALS — BP 123/81 | HR 96 | Temp 98.5°F | Wt 149.0 lb

## 2015-09-10 DIAGNOSIS — L539 Erythematous condition, unspecified: Secondary | ICD-10-CM | POA: Diagnosis present

## 2015-09-10 MED ORDER — CEPHALEXIN 500 MG PO CAPS: 500.0000 mg | ORAL_CAPSULE | Freq: Two times a day (BID) | ORAL | Status: DC
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
## 2015-09-17 ENCOUNTER — Encounter: Payer: Self-pay | Admitting: Family Medicine

## 2015-09-17 ENCOUNTER — Ambulatory Visit (INDEPENDENT_AMBULATORY_CARE_PROVIDER_SITE_OTHER): Payer: Medicare Other | Admitting: Family Medicine

## 2015-09-17 VITALS — BP 140/73 | HR 87 | Temp 98.4°F | Ht 63.0 in | Wt 152.6 lb

## 2015-09-17 DIAGNOSIS — L539 Erythematous condition, unspecified: Secondary | ICD-10-CM

## 2015-09-17 MED ORDER — PREDNISONE 50 MG PO TABS: 50.0000 mg | ORAL_TABLET | Freq: Every day | ORAL | Status: DC

## 2015-09-17 NOTE — Patient Instructions (Signed)
Take prednisone 50mg  daily for 7 days Let's see if this helps with the swelling, itching, and warmth If it gets worse on this medicine or you have fevers please call us ASAP  Be well, Dr. Ardelia Mems
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
## 2015-09-24 ENCOUNTER — Encounter: Payer: Self-pay | Admitting: Family Medicine

## 2015-09-24 ENCOUNTER — Ambulatory Visit (INDEPENDENT_AMBULATORY_CARE_PROVIDER_SITE_OTHER): Payer: Medicare Other | Admitting: Family Medicine

## 2015-09-24 VITALS — BP 143/74 | HR 82 | Temp 99.2°F | Ht 63.0 in | Wt 155.0 lb

## 2015-09-24 DIAGNOSIS — L539 Erythematous condition, unspecified: Secondary | ICD-10-CM | POA: Diagnosis present

## 2015-09-24 MED ORDER — PREDNISONE 20 MG PO TABS: ORAL_TABLET | ORAL | Status: DC

## 2015-09-24 NOTE — Assessment & Plan Note (Signed)
Improving on steroid Will taper prednisone off over the next week Follow up if not continuing to improve or if worsening
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
## 2015-10-05 DIAGNOSIS — N898 Other specified noninflammatory disorders of vagina: Secondary | ICD-10-CM | POA: Diagnosis not present

## 2015-10-15 ENCOUNTER — Telehealth: Payer: Self-pay | Admitting: Gastroenterology

## 2015-10-15 NOTE — Telephone Encounter (Signed)
Patient notified

## 2015-10-15 NOTE — Telephone Encounter (Signed)
Patient states she is having gas and bloating again. She states she did not take Xifaxan in March because she got better. She is asking if she can take it now since she is having the same symptoms again. (She still has the medication from March) Please, advise.

## 2015-10-15 NOTE — Telephone Encounter (Signed)
Yes that's fine she can take it, hope it helps.

## 2015-10-16 DIAGNOSIS — H5500 Unspecified nystagmus: Secondary | ICD-10-CM | POA: Diagnosis not present

## 2015-10-16 DIAGNOSIS — H04123 Dry eye syndrome of bilateral lacrimal glands: Secondary | ICD-10-CM | POA: Diagnosis not present

## 2015-10-16 DIAGNOSIS — H40051 Ocular hypertension, right eye: Secondary | ICD-10-CM | POA: Diagnosis not present

## 2015-10-26 ENCOUNTER — Ambulatory Visit (INDEPENDENT_AMBULATORY_CARE_PROVIDER_SITE_OTHER): Payer: Medicare Other | Admitting: Podiatry

## 2015-10-26 ENCOUNTER — Ambulatory Visit (INDEPENDENT_AMBULATORY_CARE_PROVIDER_SITE_OTHER): Payer: Medicare Other

## 2015-10-26 DIAGNOSIS — M79672 Pain in left foot: Secondary | ICD-10-CM

## 2015-10-26 DIAGNOSIS — M79671 Pain in right foot: Secondary | ICD-10-CM | POA: Diagnosis not present

## 2015-10-26 DIAGNOSIS — R600 Localized edema: Secondary | ICD-10-CM | POA: Diagnosis not present

## 2015-10-26 DIAGNOSIS — M779 Enthesopathy, unspecified: Secondary | ICD-10-CM | POA: Diagnosis not present

## 2015-10-26 MED ORDER — METHYLPREDNISOLONE 4 MG PO TBPK: ORAL_TABLET | ORAL | Status: DC

## 2015-10-27 NOTE — Progress Notes (Signed)
Subjective:     Patient ID: Regina Sawyer, female   DOB: [birth year], 60 y.o.   MRN: [id number]  HPI patient states I'm some improved but I continue to get swelling right over left foot   Review of Systems     Objective:   Physical Exam Neurovascular status intact negative Homan sign was noted with patient having +1 pitting edema in the right foot and ankle with mild discomfort when I move the ankle inversion eversion    Assessment:     Localized edematous process right over left with possibility for vein disease    Plan:     Reviewed patient seen vein doctor and she's going to make an appointment will let us know if it's not available and I went ahead and applied a surgical stocking to try to reduce edema right and instructed to let us know immediately if any further swelling or any problems should occur

## 2015-10-28 ENCOUNTER — Ambulatory Visit: Payer: Medicare Other | Admitting: Podiatry

## 2015-11-17 ENCOUNTER — Other Ambulatory Visit: Payer: Self-pay | Admitting: Family Medicine

## 2015-11-23 ENCOUNTER — Ambulatory Visit: Payer: Medicare Other | Admitting: Family Medicine

## 2015-11-24 DIAGNOSIS — Z1231 Encounter for screening mammogram for malignant neoplasm of breast: Secondary | ICD-10-CM | POA: Diagnosis not present

## 2015-12-10 ENCOUNTER — Ambulatory Visit (INDEPENDENT_AMBULATORY_CARE_PROVIDER_SITE_OTHER): Payer: Medicare Other | Admitting: Podiatry

## 2015-12-10 DIAGNOSIS — M779 Enthesopathy, unspecified: Secondary | ICD-10-CM | POA: Diagnosis not present

## 2015-12-10 DIAGNOSIS — R600 Localized edema: Secondary | ICD-10-CM | POA: Diagnosis not present

## 2015-12-10 DIAGNOSIS — M722 Plantar fascial fibromatosis: Secondary | ICD-10-CM | POA: Diagnosis not present

## 2015-12-10 MED ORDER — TRAMADOL HCL 50 MG PO TABS: 50.0000 mg | ORAL_TABLET | Freq: Two times a day (BID) | ORAL | 0 refills | Status: DC

## 2015-12-11 NOTE — Progress Notes (Signed)
Subjective:     Patient ID: Regina Sawyer, female   DOB: March 13, 1956, 60 y.o.   MRN: [id number]  HPI patient presents with swelling in the ankle region bilateral nondescript in nature and pain in the plantar heels bilateral   Review of Systems     Objective:   Physical Exam  Neurovascular status intact negative Homans sign was noted with patient noted to have inflammation of the plantar heel region bilateral and also mild edema of the ankle region bilateral with no other pathology noted    Assessment:     Inflammatory fasciitis bilateral and mild ankle pain bilateral that is localized in nature    Plan:     Discussed cortisone injection to reduce inflammation and patient wants to hold off at this time and try stretching heat and ice and supportive shoes

## 2015-12-14 DIAGNOSIS — Z6828 Body mass index (BMI) 28.0-28.9, adult: Secondary | ICD-10-CM | POA: Diagnosis not present

## 2015-12-14 DIAGNOSIS — Z01419 Encounter for gynecological examination (general) (routine) without abnormal findings: Secondary | ICD-10-CM | POA: Diagnosis not present

## 2015-12-14 DIAGNOSIS — Z124 Encounter for screening for malignant neoplasm of cervix: Secondary | ICD-10-CM | POA: Diagnosis not present

## 2015-12-14 LAB — RESULTS CONSOLE HPV: CHL HPV: NEGATIVE

## 2015-12-14 LAB — HM PAP SMEAR

## 2015-12-15 DIAGNOSIS — L309 Dermatitis, unspecified: Secondary | ICD-10-CM | POA: Diagnosis not present

## 2015-12-31 ENCOUNTER — Ambulatory Visit (HOSPITAL_COMMUNITY)
Admission: EM | Admit: 2015-12-31 | Discharge: 2015-12-31 | Disposition: A | Discharge status: Home / Self Care | Payer: Medicare Other | Attending: Internal Medicine | Admitting: Internal Medicine

## 2015-12-31 ENCOUNTER — Encounter (HOSPITAL_COMMUNITY): Payer: Self-pay | Admitting: Emergency Medicine

## 2015-12-31 DIAGNOSIS — M79672 Pain in left foot: Secondary | ICD-10-CM | POA: Diagnosis not present

## 2015-12-31 DIAGNOSIS — M79671 Pain in right foot: Secondary | ICD-10-CM | POA: Diagnosis not present

## 2015-12-31 DIAGNOSIS — R600 Localized edema: Secondary | ICD-10-CM | POA: Diagnosis not present

## 2015-12-31 MED ORDER — CARVEDILOL 6.25 MG PO TABS: 6.2500 mg | ORAL_TABLET | Freq: Two times a day (BID) | ORAL | 1 refills | Status: DC

## 2015-12-31 MED ORDER — FUROSEMIDE 20 MG PO TABS: 20.0000 mg | ORAL_TABLET | ORAL | 1 refills | Status: DC | PRN

## 2015-12-31 NOTE — ED Provider Notes (Signed)
[place] [facility]    CSN: [id number] Arrival date & time: 12/31/15  1346  First Provider Contact:  None       History   Chief Complaint Chief Complaint  Patient presents with  . Foot Pain    HPI Regina Sawyer is a 60 y.o. female.   The patient with PMH sig for sickle cell trait, metabolic syndrome and a heart murmur presents to urgent care c/o bilaterally ankle/heel pain.  She works on her feet all day and admits that her shoes are old. However, she also has dermatitis on posterior calves (which she attributes to a chemical accidentally sprayed on her legs at work and diagnosed as eczema in the past) and swelling to her mid calf bilaterally.  She states that she has been drinking meal supplements to game weight but admits that her face seems full.  Denies orthopnea or exercise intolerance.        Past Medical History:  Diagnosis Date  . Asthma   . Atrophic vaginitis 2009  . BV (bacterial vaginosis) 2005  . Candida vaginitis 2006  . Candida vaginitis 2009  . Depression   . Dysuria 2007  . Female pelvic pain 2006  . Fibroid 2005  . GERD (gastroesophageal reflux disease)   . H/O constipation 2009  . H/O dyspareunia 2008  . H/O urinary frequency 2011  . H/O vaginitis 2005  . Heart murmur   . History of bacterial infection   . History of hemorrhoids 2009  . Hx: UTI (urinary tract infection) 2007  . Hyperlipidemia   . Hypertension   . Irregular periods/menstrual cycles 2007  . Libido, decreased 2006  . Menopausal symptoms 2007  . Nocturia 2009  . Proteinuria 2009  . Sickle cell trait ([place])   . Tenderness of female pelvic organs 2005  . Ulcer   . Vaginal irritation 2011    Patient Active Problem List   Diagnosis Date Noted  . Erythema and swelling of lower extremity 09/07/2015  . Right foot pain 12/09/2014  . Benign neoplasm of ascending colon 10/28/2014  . Internal hemorrhoids 10/28/2014  . Vulvar lesion 10/07/2014  . Abdominal pain, epigastric  07/10/2014  . Dry mouth 07/10/2014  . Weight loss 06/04/2014  . Prediabetes 05/13/2014  . Elevated serum creatinine 05/12/2014  . Injury of left index finger 01/23/2014  . Arm pain, left 01/23/2014  . Cervical polyp 10/14/2013  . Vaginal itching 11/03/2012  . Cystitis 10/16/2012  . Hyperlipidemia 08/26/2009  . Anxiety and depression 07/30/2008  . VAGINITIS, ATROPHIC 05/08/2008  . POSTMENOPAUSAL BLEEDING 11/29/2007  . SYSTOLIC MURMUR 99991111  . HYPERTENSION, BENIGN ESSENTIAL 02/05/2007  . ANEMIA, IRON DEFICIENCY, UNSPEC. 06/15/2006  . SICKLE CELL TRAIT 06/15/2006  . RHINITIS, ALLERGIC 06/15/2006  . GASTROESOPHAGEAL REFLUX, NO ESOPHAGITIS 06/15/2006    Past Surgical History:  Procedure Laterality Date  . CARPAL TUNNEL RELEASE     both wrists  . COLONOSCOPY N/A 10/28/2014   Procedure: COLONOSCOPY;  Surgeon: Inda Castle, MD;  Location: [facility] ENDOSCOPY;  Service: Endoscopy;  Laterality: N/A;  . ESOPHAGOGASTRODUODENOSCOPY N/A 08/06/2014   Procedure: ESOPHAGOGASTRODUODENOSCOPY (EGD);  Surgeon: Inda Castle, MD;  Location: [facility];  Service: Endoscopy;  Laterality: N/A;  . TUBAL LIGATION      OB History    Gravida Para Term Preterm AB Living   2 2 2  0 0 2   SAB TAB Ectopic Multiple Live Births   0 0 0 0         Home Medications  Prior to Admission medications   Medication Sig Start Date End Date Taking? Authorizing Provider  DEXILANT 60 MG capsule TAKE ONE CAPSULE BY MOUTH EVERY DAY 09/25/15  Yes Manus Gunning, MD  mirtazapine (REMERON) 15 MG tablet TAKE 1 TABLET AT BEDTIME 11/18/15  Yes Leeanne Rio, MD  carvedilol (COREG) 6.25 MG tablet Take 1 tablet (6.25 mg total) by mouth 2 (two) times daily with a meal. 12/31/15 01/30/16  Harrie Foreman, MD  furosemide (LASIX) 20 MG tablet Take 1 tablet (20 mg total) by mouth every other day as needed for fluid ((swelling of lower legs)). 12/31/15   Harrie Foreman, MD  traMADol (ULTRAM) 50 MG tablet Take 1  tablet (50 mg total) by mouth 2 (two) times daily. 12/10/15   Wallene Huh, DPM    Family History Family History  Problem Relation Age of Onset  . Diabetes Other   . Diabetes Sister   . Hypertension Sister   . Colon cancer Father   . Heart failure Brother     x2  . Gallstones Mother   . Colon polyps Neg Hx   . Kidney disease Neg Hx   . Esophageal cancer Neg Hx   . Gallbladder disease Neg Hx     Social History Social History  Substance Use Topics  . Smoking status: Passive Smoke Exposure - Never Smoker  . Smokeless tobacco: Never Used  . Alcohol use No     Allergies   Atorvastatin and Latex   Review of Systems Review of Systems  Constitutional: Negative for chills and fever.  HENT: Negative for sore throat and tinnitus.   Eyes: Negative for redness.  Respiratory: Negative for cough and shortness of breath.   Cardiovascular: Negative for chest pain and palpitations.  Gastrointestinal: Negative for abdominal pain, diarrhea, nausea and vomiting.  Genitourinary: Negative for dysuria, frequency and urgency.  Musculoskeletal: Positive for joint swelling. Negative for myalgias.  Skin: Negative for rash.       No lesions  Neurological: Negative for weakness.  Hematological: Does not bruise/bleed easily.  Psychiatric/Behavioral: Negative for suicidal ideas.     Physical Exam Triage Vital Signs ED Triage Vitals  Enc Vitals Group     BP 12/31/15 1444 161/84     Pulse Rate 12/31/15 1444 86     Resp 12/31/15 1444 14     Temp 12/31/15 1444 99 F (37.2 C)     Temp Source 12/31/15 1444 Oral     SpO2 12/31/15 1444 100 %     Weight --      Height --      Head Circumference --      Peak Flow --      Pain Score 12/31/15 1452 8     Pain Loc --      Pain Edu? --      Excl. in [place]? --    No data found.   Updated Vital Signs BP 161/84 (BP Location: Left Arm)   Pulse 86   Temp 99 F (37.2 C) (Oral)   Resp 14   LMP  (Approximate)   SpO2 100%   Visual  Acuity Right Eye Distance:   Left Eye Distance:   Bilateral Distance:    Right Eye Near:   Left Eye Near:    Bilateral Near:     Physical Exam  Constitutional: She is oriented to person, place, and time. She appears well-developed and well-nourished. No distress.  HENT:  Head: Normocephalic and atraumatic.  Mouth/Throat: Oropharynx is clear and moist.  Eyes: Conjunctivae and EOM are normal. Pupils are equal, round, and reactive to light. No scleral icterus.  Neck: Normal range of motion. Neck supple. No JVD present. No tracheal deviation present. No thyromegaly present.  Cardiovascular: Normal rate, regular rhythm and normal heart sounds.  Exam reveals no gallop and no friction rub.   No murmur heard. Pulmonary/Chest: Effort normal and breath sounds normal.  Abdominal: Soft. Bowel sounds are normal. She exhibits no distension. There is no tenderness.  Musculoskeletal: Normal range of motion. She exhibits edema (2+ pitting) and tenderness (calves bilaterally).  Lymphadenopathy:    She has no cervical adenopathy.  Neurological: She is alert and oriented to person, place, and time. No cranial nerve deficit.  Skin: Skin is warm and dry.  Psychiatric: She has a normal mood and affect. Her behavior is normal. Judgment and thought content normal.  Nursing note and vitals reviewed.    UC Treatments / Results  Labs (all labs ordered are listed, but only abnormal results are displayed) Labs Reviewed - No data to display  EKG  EKG Interpretation None       Radiology No results found.  Procedures Procedures (including critical care time)  Medications Ordered in UC Medications - No data to display   Initial Impression / Assessment and Plan / UC Course  I have reviewed the triage vital signs and the nursing notes.  Pertinent labs & imaging results that were available during my care of the patient were reviewed by me and considered in my medical decision making (see chart for  details).  Clinical Course    "Rash" likely venous stasis dermatitis.  Concerned that swelling represents early heart failure.  Ddx includes severe venous insufficiency. D/c amlodipine.  Start low dose Coreg and Lasix q48 prn.  Refer to cardiology. Also advised heel support for heel pain.  Final Clinical Impressions(s) / UC Diagnoses   Final diagnoses:  Bilateral lower extremity edema  Heel pain, bilateral    New Prescriptions Discharge Medication List as of 12/31/2015  3:21 PM    START taking these medications   Details  carvedilol (COREG) 6.25 MG tablet Take 1 tablet (6.25 mg total) by mouth 2 (two) times daily with a meal., Starting Thu 12/31/2015, Until Sat 01/30/2016, Normal    furosemide (LASIX) 20 MG tablet Take 1 tablet (20 mg total) by mouth every other day as needed for fluid ((swelling of lower legs))., Starting Thu 12/31/2015, Normal         Harrie Foreman, MD 12/31/15 1537

## 2015-12-31 NOTE — ED Triage Notes (Signed)
Pt is here for bilateral foot/heel pain onset 3 weeks that has become more constant  Denies inj/trauma  States she works on her feet all day  A&O x4... NAD

## 2016-01-01 ENCOUNTER — Ambulatory Visit (INDEPENDENT_AMBULATORY_CARE_PROVIDER_SITE_OTHER): Payer: Medicare Other | Admitting: *Deleted

## 2016-01-01 VITALS — BP 148/80 | HR 86 | Wt 159.0 lb

## 2016-01-01 DIAGNOSIS — Z0130 Encounter for examination of blood pressure without abnormal findings: Secondary | ICD-10-CM

## 2016-01-01 DIAGNOSIS — Z136 Encounter for screening for cardiovascular disorders: Secondary | ICD-10-CM

## 2016-01-01 DIAGNOSIS — I10 Essential (primary) hypertension: Secondary | ICD-10-CM

## 2016-01-01 NOTE — Progress Notes (Signed)
   Patient in nurse clinic for blood pressure check. Patient was seen at Urgent Care yesterday for right leg/ankle  Swelling.  Patient was given Coreg 6.25 mg twice daily and Lasix 20 mg once daily.  Patient started both medications this morning.  Patient has follow up appointment on 01/04/16 at 3:15 PM.  Patient denies chest pain, visual changes, headache, dizziness or SOB.  Pt stated that Urgent Care provider told her, that she may have CHF.  Patient was referred to cardiologist for follow up.  Will forward to PCP.  Derl Barrow, RN  Today's Vitals   01/01/16 1429 01/01/16 1434  BP: (!) 150/90 (!) 148/80  Pulse: 90 86  SpO2: 99% 98%  Weight: 159 lb (72.1 kg)   PainSc: 0-No pain

## 2016-01-04 ENCOUNTER — Ambulatory Visit (INDEPENDENT_AMBULATORY_CARE_PROVIDER_SITE_OTHER): Payer: Medicare Other | Admitting: Cardiology

## 2016-01-04 ENCOUNTER — Ambulatory Visit (INDEPENDENT_AMBULATORY_CARE_PROVIDER_SITE_OTHER): Payer: Medicare Other | Admitting: Internal Medicine

## 2016-01-04 ENCOUNTER — Encounter: Payer: Self-pay | Admitting: *Deleted

## 2016-01-04 ENCOUNTER — Telehealth: Payer: Self-pay | Admitting: Cardiology

## 2016-01-04 ENCOUNTER — Encounter: Payer: Self-pay | Admitting: Cardiology

## 2016-01-04 VITALS — BP 153/75 | HR 70 | Temp 99.1°F | Wt 157.4 lb

## 2016-01-04 VITALS — BP 154/90 | HR 67 | Ht 62.0 in | Wt 158.0 lb

## 2016-01-04 DIAGNOSIS — I10 Essential (primary) hypertension: Secondary | ICD-10-CM | POA: Diagnosis not present

## 2016-01-04 DIAGNOSIS — R601 Generalized edema: Secondary | ICD-10-CM | POA: Diagnosis not present

## 2016-01-04 DIAGNOSIS — R600 Localized edema: Secondary | ICD-10-CM | POA: Diagnosis not present

## 2016-01-04 MED ORDER — LISINOPRIL 10 MG PO TABS: 10.0000 mg | ORAL_TABLET | Freq: Every day | ORAL | 3 refills | Status: DC

## 2016-01-04 NOTE — Telephone Encounter (Signed)
No note needed 

## 2016-01-04 NOTE — Progress Notes (Signed)
HPI: 60 year old female for evaluation of lower extremity edema. Seen recently for this issue in urgent care. Amlodipine was discontinued. Patient was also given Lasix and asked to follow-up with cardiology. Echocardiogram November 2009 showed normal LV function. Lower extremity venous Dopplers May 2017 showed no DVT. Patient had recent lower extremity edema predominantly in the left lower extremity. She attributed this to a chemical being spilled. This has improved after discontinuing amlodipine. She denies dyspnea, chest pain, palpitations or syncope.    Current Outpatient Prescriptions  Medication Sig Dispense Refill  . carvedilol (COREG) 6.25 MG tablet Take 1 tablet (6.25 mg total) by mouth 2 (two) times daily with a meal. 60 tablet 1  . DEXILANT 60 MG capsule TAKE ONE CAPSULE BY MOUTH EVERY DAY 90 capsule 3  . furosemide (LASIX) 20 MG tablet Take 1 tablet (20 mg total) by mouth every other day as needed for fluid ((swelling of lower legs)). 30 tablet 1  . mirtazapine (REMERON) 15 MG tablet TAKE 1 TABLET AT BEDTIME 30 tablet 2  . traMADol (ULTRAM) 50 MG tablet Take 1 tablet (50 mg total) by mouth 2 (two) times daily. 30 tablet 0   No current facility-administered medications for this visit.     Allergies  Allergen Reactions  . Atorvastatin Other (See Comments)    Abdominal pain, severe nausea, muscle / body aches  . Latex Hives and Itching     Past Medical History:  Diagnosis Date  . Asthma   . Atrophic vaginitis 2009  . BV (bacterial vaginosis) 2005  . Candida vaginitis 2006  . Candida vaginitis 2009  . Depression   . Dysuria 2007  . Female pelvic pain 2006  . Fibroid 2005  . GERD (gastroesophageal reflux disease)   . H/O constipation 2009  . H/O dyspareunia 2008  . H/O urinary frequency 2011  . H/O vaginitis 2005  . Heart murmur   . History of bacterial infection   . History of hemorrhoids 2009  . Hx: UTI (urinary tract infection) 2007  . Hyperlipidemia   .  Hypertension   . Irregular periods/menstrual cycles 2007  . Libido, decreased 2006  . Menopausal symptoms 2007  . Nocturia 2009  . Proteinuria 2009  . Sickle cell trait ([place])   . Tenderness of female pelvic organs 2005  . Ulcer   . Vaginal irritation 2011    Past Surgical History:  Procedure Laterality Date  . CARPAL TUNNEL RELEASE     both wrists  . COLONOSCOPY N/A 10/28/2014   Procedure: COLONOSCOPY;  Surgeon: Inda Castle, MD;  Location: [facility];  Service: Endoscopy;  Laterality: N/A;  . ESOPHAGOGASTRODUODENOSCOPY N/A 08/06/2014   Procedure: ESOPHAGOGASTRODUODENOSCOPY (EGD);  Surgeon: Inda Castle, MD;  Location: [facility];  Service: Endoscopy;  Laterality: N/A;  . TUBAL LIGATION      Social History   Social History  . Marital status: Married    Spouse name: N/A  . Number of children: 2  . Years of education: N/A   Occupational History  . Cafeteria Employee    Social History Main Topics  . Smoking status: Passive Smoke Exposure - Never Smoker  . Smokeless tobacco: Never Used  . Alcohol use No  . Drug use: No  . Sexual activity: Yes    Partners: Male    Birth control/ protection: Condom     Comment: BTL   Other Topics Concern  . Not on file   Social History Narrative  . No narrative on  file    Family History  Problem Relation Age of Onset  . Diabetes Other   . Diabetes Sister   . Hypertension Sister   . Colon cancer Father   . Heart failure Father   . Heart failure Brother     x2  . Gallstones Mother   . Colon polyps Neg Hx   . Kidney disease Neg Hx   . Esophageal cancer Neg Hx   . Gallbladder disease Neg Hx     ROS: no fevers or chills, productive cough, hemoptysis, dysphasia, odynophagia, melena, hematochezia, dysuria, hematuria, rash, seizure activity, orthopnea, PND, claudication. Remaining systems are negative.  Physical Exam:   Blood pressure (!) 154/90, pulse 67, height 5\' 2"  (1.575 m), weight 158 lb (71.7 kg).  General:   Well developed/well nourished in NAD Skin warm/dry Patient not depressed No peripheral clubbing Back-normal HEENT-normal/normal eyelids Neck supple/normal carotid upstroke bilaterally; no bruits; no JVD; no thyromegaly chest - CTA/ normal expansion CV - RRR/normal S1 and S2; no murmurs, rubs or gallops;  PMI nondisplaced Abdomen -NT/ND, no HSM, no mass, + bowel sounds, no bruit 2+ femoral pulses, no bruits Ext-trace edema, no chords, 2+ DP Neuro-grossly nonfocal  ECG -Sinus rhythm at a rate of 67. No ST changes.  A/P  1 edema-likely secondary to amlodipine as it has now improved. She does have a family history of congestive heart failure and her brother and father. I will arrange an echocardiogram to assess LV function. If normal we will not pursue further cardiac evaluation.  2 hypertension-blood pressure is mildly elevated she is anxious in the office today. She is scheduled to see her primary care office later today and her carvedilol can be advanced as needed.   3 hyperlipidemia - management per primary care.  Kirk Ruths, MD

## 2016-01-04 NOTE — Progress Notes (Signed)
   [facility] Regina Mo, MD Phone: [phone]  Reason For Visit: Leg swelling   # Urgent care for swelling in her legs. Amlodopine was stopped and patient swelling her legs improved and she denies having any swelling in her legs now. However, patient is now worried about her blood pressure being elevated. She denies any dizziness, headaches, blurry vision, chest pain.   Past Medical History Reviewed problem list.  Medications- reviewed and updated No additions to family history Social history- patient is a non smoker  Objective: BP (!) 153/75 (BP Location: Left Arm, Patient Position: Sitting, Cuff Size: Normal)   Pulse 70   Temp 99.1 F (37.3 C) (Oral)   Wt 157 lb 6.4 oz (71.4 kg)   LMP  (Approximate)   SpO2 100%   BMI 28.79 kg/m  Gen: NAD, alert, cooperative with exam Cardio: regular rate and rhythm, S1S2 heard, no murmurs appreciated Skin: dry, intact, no rashes or lesions, +1 pitting edema bilaterally at the ankles, negative for calf pain, homan's negative .   Assessment/Plan: See problem based a/p  HYPERTENSION, BENIGN ESSENTIAL Recently stopped Norvasc per ED physician instruction due to leg swelling. Blood pressure slightly elevated, goal of 140/80. Therefore started patient on lisinopril, recent creatinine in March noted for baseline, patient to follow up in 1 with PCP for blood pressure and to check BMET.

## 2016-01-04 NOTE — Patient Instructions (Addendum)
I'm going to start you on a medication to help with your blood pressure. I want to follow-up in one week to get your kidneys checked following this. Please make an appointment with your PCP for 1 week.

## 2016-01-04 NOTE — Patient Instructions (Signed)
Medication Instructions:   NO CHANGE  Testing/Procedures:  Your physician has requested that you have an echocardiogram. Echocardiography is a painless test that uses sound waves to create images of your heart. It provides your doctor with information about the size and shape of your heart and how well your heart's chambers and valves are working. This procedure takes approximately one hour. There are no restrictions for this procedure.    Follow-Up:  Your physician recommends that you schedule a follow-up appointment in: AS NEEDED PENDING TEST RESULTS      

## 2016-01-07 NOTE — Assessment & Plan Note (Signed)
Recently stopped Norvasc per ED physician instruction due to leg swelling. Blood pressure slightly elevated, goal of 140/80. Therefore started patient on lisinopril, recent creatinine in March noted for baseline, patient to follow up in 1 with PCP for blood pressure and to check BMET.

## 2016-01-11 ENCOUNTER — Ambulatory Visit (INDEPENDENT_AMBULATORY_CARE_PROVIDER_SITE_OTHER): Payer: Medicare Other | Admitting: Family Medicine

## 2016-01-11 ENCOUNTER — Ambulatory Visit: Payer: Self-pay | Admitting: Family Medicine

## 2016-01-11 VITALS — BP 162/88 | HR 66 | Temp 98.5°F | Ht 62.0 in | Wt 157.0 lb

## 2016-01-11 DIAGNOSIS — I10 Essential (primary) hypertension: Secondary | ICD-10-CM

## 2016-01-11 DIAGNOSIS — R634 Abnormal weight loss: Secondary | ICD-10-CM | POA: Diagnosis not present

## 2016-01-11 MED ORDER — MIRTAZAPINE 7.5 MG PO TABS: 7.5000 mg | ORAL_TABLET | Freq: Every day | ORAL | 1 refills | Status: DC

## 2016-01-11 NOTE — Progress Notes (Signed)
Date of Visit: 01/11/2016   HPI:  Patient presents for follow up of hypertension and leg swelling.  Leg swelling resolved with stopping amlodipine. Feels like legs are doing well. Was seen last week and prescribed lisinopril 10mg  daily, but has not yet started it. Was afraid of it after reading long list of possible side effects. Denies any personal history of angioedema.   Weight - weight notably up. Taking remeron 15mg  nightly. Helps her sleep. Has helped with weight gain. Mood is good.   ROS: See HPI.  [place]: history of hypertension, hyperlipidemia, GERD  PHYSICAL EXAM: BP (!) 162/88   Pulse 66   Temp 98.5 F (36.9 C) (Oral)   Ht 5\' 2"  (1.575 m)   Wt 157 lb (71.2 kg)   LMP  (Approximate)   BMI 28.72 kg/m  Gen: NAD, pleasant, cooperative HEENT: normocephalic, atraumatic, moist mucous membranes  Heart: regular rate and rhythm, 2/6 murmur as previously heard Lungs: clear to auscultation bilaterally, normal work of breathing  Neuro: alert, grossly nonfocal, speech normal Ext: No appreciable lower extremity edema bilaterally   ASSESSMENT/PLAN:  Health maintenance:  -declines flu shot for this season, discussed today  HYPERTENSION, BENIGN ESSENTIAL Blood pressure up, including on repeat. No contraindications to starting lisinopril. Encouraged her to go ahead and start it. Follow up in 1 week for RN BP check & BMET.   Hypercalcemia History of hypercalcemia noted. Patient asking today whether she can safely take calcium 600 & vit D3 800 supplement. Will await next week's labs before making that assessment.  Weight loss Weight now up above goal -  Body mass index is 28.72 kg/m.  Will decrease remeron to 7.5mg  nightly. Follow up in 1 month for recheck of weight.   FOLLOW UP: Follow up in 1 week for RN BP check & lab visit See me in 1 month for weight  [place] J. Ardelia Mems, [place]

## 2016-01-11 NOTE — Patient Instructions (Addendum)
Great to see you again today.  Start the lisinopril as prescribed Follow up in 1 week for nurse BP check and lab visit to check kidneys and calcium level  Go down on the remeron (mirtazapine) at night to 7.5mg  at night. Sent in a new prescription.  Follow up with me in 1 month to recheck weight  For Dominica Severin: the GI phone number is: [phone]   Be well, Dr. Ardelia Mems

## 2016-01-14 DIAGNOSIS — L308 Other specified dermatitis: Secondary | ICD-10-CM | POA: Diagnosis not present

## 2016-01-14 NOTE — Assessment & Plan Note (Signed)
Blood pressure up, including on repeat. No contraindications to starting lisinopril. Encouraged her to go ahead and start it. Follow up in 1 week for RN BP check & BMET.

## 2016-01-14 NOTE — Assessment & Plan Note (Signed)
History of hypercalcemia noted. Patient asking today whether she can safely take calcium 600 & vit D3 800 supplement. Will await next week's labs before making that assessment.

## 2016-01-14 NOTE — Assessment & Plan Note (Signed)
Weight now up above goal -  Body mass index is 28.72 kg/m.  Will decrease remeron to 7.5mg  nightly. Follow up in 1 month for recheck of weight.

## 2016-01-18 ENCOUNTER — Ambulatory Visit (INDEPENDENT_AMBULATORY_CARE_PROVIDER_SITE_OTHER): Payer: Medicare Other | Admitting: *Deleted

## 2016-01-18 ENCOUNTER — Ambulatory Visit (HOSPITAL_COMMUNITY): Payer: Medicare Other | Attending: Cardiology

## 2016-01-18 ENCOUNTER — Other Ambulatory Visit: Payer: Self-pay

## 2016-01-18 ENCOUNTER — Other Ambulatory Visit: Payer: Medicare Other

## 2016-01-18 VITALS — BP 132/86

## 2016-01-18 DIAGNOSIS — E785 Hyperlipidemia, unspecified: Secondary | ICD-10-CM | POA: Insufficient documentation

## 2016-01-18 DIAGNOSIS — I10 Essential (primary) hypertension: Secondary | ICD-10-CM | POA: Diagnosis not present

## 2016-01-18 DIAGNOSIS — I071 Rheumatic tricuspid insufficiency: Secondary | ICD-10-CM | POA: Insufficient documentation

## 2016-01-18 DIAGNOSIS — R601 Generalized edema: Secondary | ICD-10-CM | POA: Diagnosis not present

## 2016-01-18 DIAGNOSIS — I340 Nonrheumatic mitral (valve) insufficiency: Secondary | ICD-10-CM | POA: Diagnosis not present

## 2016-01-18 LAB — RENAL FUNCTION PANEL
ALBUMIN: 3.9 g/dL (ref 3.6–5.1)
BUN: 12 mg/dL (ref 7–25)
CALCIUM: 9.7 mg/dL (ref 8.6–10.4)
CO2: 27 mmol/L (ref 20–31)
Chloride: 103 mmol/L (ref 98–110)
Creat: 1.07 mg/dL — ABNORMAL HIGH (ref 0.50–1.05)
Glucose, Bld: 91 mg/dL (ref 65–99)
PHOSPHORUS: 3.6 mg/dL (ref 2.5–4.5)
POTASSIUM: 4.4 mmol/L (ref 3.5–5.3)
SODIUM: 140 mmol/L (ref 135–146)

## 2016-01-18 LAB — ECHOCARDIOGRAM COMPLETE

## 2016-01-18 NOTE — Progress Notes (Signed)
Patient in today for blood pressure check, Blood pressure taken manually in left arm was 132/86, pulse 90. Patient reports compliance with blood pressure medications.

## 2016-01-19 ENCOUNTER — Encounter: Payer: Self-pay | Admitting: Family Medicine

## 2016-01-19 ENCOUNTER — Ambulatory Visit (INDEPENDENT_AMBULATORY_CARE_PROVIDER_SITE_OTHER): Payer: Medicare Other | Admitting: Family Medicine

## 2016-01-19 ENCOUNTER — Telehealth: Payer: Self-pay | Admitting: Cardiology

## 2016-01-19 DIAGNOSIS — I10 Essential (primary) hypertension: Secondary | ICD-10-CM

## 2016-01-19 NOTE — Patient Instructions (Addendum)
-   Reduce your multicitamin supplement to one tablet per day b/c you probably can't absorb that much (2 tab's) at a time, so when you take a double dose, you end up wasting much of it.  Or - if you want to take more than one tablet daily, split your dosage up to take at different times.  This will give you the best absorption of the vitamins.    - To help control your blood pressure: 1. Limit sodium intake to 1500 mg a day.   2. Increase sources of potassium = FRUITS AND VEGETABLES!  - Make sure you use fresh or frozen instead of canned.   3. Walk at least 180 minutes a week = 30 min 6 X wk.   4. Lose some weight.    - To help you get to a good weight: 1. Eat at least 3 REAL meals and 1-2 snacks per day.  Aim for no more than 5 hours between eating.  Eat breakfast within one hour of getting up.   - On your work schedule, a good eating plan would be to have:   8 AM: Fairly big breakfast (for example, a high-fiber cereal [at least 5 grams of fiber per serving] or oatmeal with at least 1 full cup of milk OR left-over dinner such as rice with chicken & veg's OR 2 eggs with 2 toast).  9:30 AM: Jell-O with fruit OR 1 piece of fruit OR applesauce OR yogurt.    2:00 PM: LUNCH that includes some protein, some starch, and fruit and/or vegetables (for example, sandwich with meat, chicken, tuna, egg salad AND some veg/fruit. Another lunch could be fish, potatoes, green beans.)  6:30 PM: DINNER that includes some protein, some starch, and fruit and/or vegetables (for example, spaghetti with meat sauce with added veg's in the sauce.    Starchy (carb) foods: Bread, rice, pasta, potatoes, corn, cereal, grits, crackers, bagels, muffins, all baked goods.  (Fruit  and yogurt also have carbohydrate, but yogurt is mainly a protein food.  All sugar is also carbohydrate, and the healthiest diet limits added sugars like desserts and candy.  When choosing a carb food (starchy food), those with the most fiber are usually the  healthiest.    Protein foods: Meat, fish, poultry, eggs, dairy foods, and beans such as pinto and kidney beans (beans also provide carbohydrate).   Differences between milks: Whole milk has ~8 grams of fat per 8 oz.  2% has ~5 grams of fat per 8 oz.  1% has 2.5 g of fat per 8 oz. Fat-free (skim) milk has NO fat.     If you need to get or change an appointment:  Call Dr. Jenne Campus at [phone].

## 2016-01-19 NOTE — Progress Notes (Signed)
Medical Nutrition Therapy:  Appt start time: 1430 end time:  [id number].  Assessment:  Primary concerns today: hypertension (I-10)  Ms. [place]osa has gained weight faster than she had intended, having reached a high of 162 lb in September.  (I did not mention to patient, but suspect Remeron may be contributing to weight gain.)  She is making an effort to cut back now, which raises further concerns about the nutritional quality of her diet (see recall below).  We reviewed weight management principles most applicable to her, and emphasized (again) the desirability of including more fruit and veg's in her diet.    Also reviewed what foods provide protein and carb, and what represents a nutritionally balanced meal.    Demonstrated degree of understanding via:  Teach Back   Ms. Bubb is now walking about 10 min a day, following work.  She sometimes drinks Pediasure (240 kcal) for breakfast.  Other cereals she has for breakfast include Sugar Pops.    24-hr recall:  (Up at 8 AM) B (8 AM)-  2 c Sugar Smacks, 1 c 1% milk, 1/2 c fruit Jell-O To work at 9 AM Snk (9:30)-  1 c rice with chx & veg's (cabbage, peas, corn), 12 oz soda L ( PM)-   Off work at 1:30 PM Snk (7 PM)-  3 Nutrigrain bars (360 kcal), water D (7 PM)-  water Snk ( PM)-   Typical day? Yes.    Progress Towards Goal(s):  In progress.  Barriers to learning/adherence to lifestyle change: Nutrition knowledge deficit.  Another barrier to adherence is poor dental health; dentures are uncomfortable, so she never wears them.  Consequently, cannot chew many veg's/fruits/meats.     Nutritional Diagnosis:  No progress on: [place]-3.2 Unintentional weight loss As related to poor eating pattern.  As evidenced by erratic eating schedule that includes no more than 2 real meals /day.    Intervention:  Nutrition education.  Handouts given during visit include:  AVS  Monitoring/Evaluation:  Dietary intake, exercise, and body weight in 4 week(s).

## 2016-01-19 NOTE — Telephone Encounter (Signed)
Spoke to patient.  Echo Result given . Verbalized understanding  

## 2016-01-19 NOTE — Telephone Encounter (Signed)
New message    Pt calling to get results for her Echo. Please call.

## 2016-01-20 ENCOUNTER — Ambulatory Visit: Payer: Self-pay | Admitting: Cardiovascular Disease

## 2016-01-25 ENCOUNTER — Encounter: Payer: Self-pay | Admitting: Family Medicine

## 2016-02-04 ENCOUNTER — Telehealth: Payer: Self-pay | Admitting: Family Medicine

## 2016-02-04 DIAGNOSIS — R102 Pelvic and perineal pain: Secondary | ICD-10-CM | POA: Diagnosis not present

## 2016-02-04 DIAGNOSIS — N898 Other specified noninflammatory disorders of vagina: Secondary | ICD-10-CM | POA: Diagnosis not present

## 2016-02-04 NOTE — Telephone Encounter (Signed)
Regina Sawyer wants dr Ardelia Mems to call angelia as soon as possible to discuss his results.

## 2016-02-23 ENCOUNTER — Ambulatory Visit: Payer: Self-pay | Admitting: Family Medicine

## 2016-02-29 ENCOUNTER — Ambulatory Visit: Payer: Self-pay | Admitting: Family Medicine

## 2016-03-01 ENCOUNTER — Other Ambulatory Visit: Payer: Self-pay | Admitting: Family Medicine

## 2016-03-02 NOTE — Telephone Encounter (Signed)
Pt is calling and would like a refill on her Carvedilol sent in . jw

## 2016-03-03 DIAGNOSIS — N905 Atrophy of vulva: Secondary | ICD-10-CM | POA: Diagnosis not present

## 2016-03-11 ENCOUNTER — Other Ambulatory Visit: Payer: Self-pay | Admitting: Family Medicine

## 2016-03-14 NOTE — Telephone Encounter (Signed)
Needs refill on mirtazapine. [facility] [address]

## 2016-03-30 ENCOUNTER — Ambulatory Visit (INDEPENDENT_AMBULATORY_CARE_PROVIDER_SITE_OTHER): Payer: Medicare Other | Admitting: Podiatry

## 2016-03-30 ENCOUNTER — Encounter: Payer: Self-pay | Admitting: Podiatry

## 2016-03-30 VITALS — BP 141/84 | HR 72 | Resp 16

## 2016-03-30 DIAGNOSIS — M722 Plantar fascial fibromatosis: Secondary | ICD-10-CM

## 2016-03-31 ENCOUNTER — Telehealth: Payer: Self-pay | Admitting: Podiatry

## 2016-03-31 NOTE — Telephone Encounter (Signed)
Pt called requarding the shots. And would like you to call her back.

## 2016-03-31 NOTE — Progress Notes (Signed)
Subjective:     Patient ID: [facility], female   DOB: March 13, 1956, 60 y.o.   MRN: [id number]  HPI patient states that she has heel pain bilateral but does not want injections and it just seems to last for a little while and then the pain reoccurs   Review of Systems     Objective:   Physical Exam Neurovascular status intact with patient found to have exquisite discomfort plantar aspect heel region bilateral    Assessment:     Chronic plantar fasciitis bilateral    Plan:     Discussed condition and recommended shoe gear modifications not going barefoot and scanned for custom orthotics today

## 2016-04-19 ENCOUNTER — Ambulatory Visit (HOSPITAL_COMMUNITY)
Admission: EM | Admit: 2016-04-19 | Discharge: 2016-04-19 | Disposition: A | Discharge status: Home / Self Care | Payer: Medicare Other | Attending: Family Medicine | Admitting: Family Medicine

## 2016-04-19 ENCOUNTER — Ambulatory Visit (INDEPENDENT_AMBULATORY_CARE_PROVIDER_SITE_OTHER): Payer: Medicare Other

## 2016-04-19 ENCOUNTER — Encounter (HOSPITAL_COMMUNITY): Payer: Self-pay | Admitting: Family Medicine

## 2016-04-19 DIAGNOSIS — J069 Acute upper respiratory infection, unspecified: Secondary | ICD-10-CM

## 2016-04-19 DIAGNOSIS — R05 Cough: Secondary | ICD-10-CM

## 2016-04-19 DIAGNOSIS — R059 Cough, unspecified: Secondary | ICD-10-CM

## 2016-04-19 DIAGNOSIS — E860 Dehydration: Secondary | ICD-10-CM

## 2016-04-19 DIAGNOSIS — I9589 Other hypotension: Secondary | ICD-10-CM

## 2016-04-19 DIAGNOSIS — R7989 Other specified abnormal findings of blood chemistry: Secondary | ICD-10-CM | POA: Diagnosis not present

## 2016-04-19 LAB — POCT I-STAT, CHEM 8
BUN: 20 mg/dL (ref 6–20)
CALCIUM ION: 1.17 mmol/L (ref 1.15–1.40)
CREATININE: 2 mg/dL — AB (ref 0.44–1.00)
Chloride: 100 mmol/L — ABNORMAL LOW (ref 101–111)
GLUCOSE: 138 mg/dL — AB (ref 65–99)
HCT: 35 % — ABNORMAL LOW (ref 36.0–46.0)
Hemoglobin: 11.9 g/dL — ABNORMAL LOW (ref 12.0–15.0)
Potassium: 3.8 mmol/L (ref 3.5–5.1)
SODIUM: 138 mmol/L (ref 135–145)
TCO2: 24 mmol/L (ref 0–100)

## 2016-04-19 MED ORDER — SODIUM CHLORIDE 0.9 % IV BOLUS (SEPSIS)
750.0000 mL | Freq: Once | INTRAVENOUS | Status: AC
  Administered 2016-04-19: 750 mL via INTRAVENOUS

## 2016-04-19 MED ORDER — ALBUTEROL SULFATE HFA 108 (90 BASE) MCG/ACT IN AERS: 2.0000 | INHALATION_SPRAY | RESPIRATORY_TRACT | 0 refills | Status: DC | PRN

## 2016-04-19 NOTE — Discharge Instructions (Signed)
Call your primary care doctor this afternoon to obtain a follow-up appointment for this week. Do not take your blood pressure medicine today or tomorrow except for the one dose of carvedilol 6.25 mg. You may take that one just once a day but do not take it twice a day or take your other one. Increase your fluid intake. Recommend taking Allegra or Zyrtec for drainage in the back of your throat and use the albuterol inhaler to help with the bronchospasm and cough.

## 2016-04-19 NOTE — ED Provider Notes (Signed)
CSN: [id number]     Arrival date & time 04/19/16  1033 History   First MD Initiated Contact with Patient 04/19/16 1144     Chief Complaint  Patient presents with  . Cough   (Consider location/radiation/quality/duration/timing/severity/associated sxs/prior Treatment) 61 year old female who appears much older than stated age is complaining of cough and congestion with fever for the past 2 days. She has had some chills and generalized soreness. Denies shortness of breath or chest pain. She has had 2 episodes of diarrhea a couple days ago and 2 episodes last night. She states she has not been taking in fluids and feels like she is dehydrated.      Past Medical History:  Diagnosis Date  . Asthma   . Atrophic vaginitis 2009  . BV (bacterial vaginosis) 2005  . Candida vaginitis 2006  . Candida vaginitis 2009  . Depression   . Dysuria 2007  . Female pelvic pain 2006  . Fibroid 2005  . GERD (gastroesophageal reflux disease)   . H/O constipation 2009  . H/O dyspareunia 2008  . H/O urinary frequency 2011  . H/O vaginitis 2005  . Heart murmur   . History of bacterial infection   . History of hemorrhoids 2009  . Hx: UTI (urinary tract infection) 2007  . Hyperlipidemia   . Hypertension   . Irregular periods/menstrual cycles 2007  . Libido, decreased 2006  . Menopausal symptoms 2007  . Nocturia 2009  . Proteinuria 2009  . Sickle cell trait ([place])   . Tenderness of female pelvic organs 2005  . Ulcer ([place])   . Vaginal irritation 2011   Past Surgical History:  Procedure Laterality Date  . CARPAL TUNNEL RELEASE     both wrists  . COLONOSCOPY N/A 10/28/2014   Procedure: COLONOSCOPY;  Surgeon: Inda Castle, MD;  Location: [facility];  Service: Endoscopy;  Laterality: N/A;  . ESOPHAGOGASTRODUODENOSCOPY N/A 08/06/2014   Procedure: ESOPHAGOGASTRODUODENOSCOPY (EGD);  Surgeon: Inda Castle, MD;  Location: [facility];  Service: Endoscopy;  Laterality: N/A;  . TUBAL LIGATION      Family History  Problem Relation Age of Onset  . Diabetes Other   . Diabetes Sister   . Hypertension Sister   . Colon cancer Father   . Heart failure Father   . Heart failure Brother     x2  . Gallstones Mother   . Colon polyps Neg Hx   . Kidney disease Neg Hx   . Esophageal cancer Neg Hx   . Gallbladder disease Neg Hx    Social History  Substance Use Topics  . Smoking status: Passive Smoke Exposure - Never Smoker  . Smokeless tobacco: Never Used  . Alcohol use No   OB History    Gravida Para Term Preterm AB Living   2 2 2  0 0 2   SAB TAB Ectopic Multiple Live Births   0 0 0 0       Review of Systems  Constitutional: Positive for activity change, appetite change, chills and fever.  HENT: Negative for ear pain, rhinorrhea and sore throat.   Eyes: Negative.   Respiratory: Positive for cough. Negative for shortness of breath.   Cardiovascular: Negative for chest pain.  Gastrointestinal: Positive for diarrhea.  Genitourinary: Negative.   Musculoskeletal: Negative.   Psychiatric/Behavioral: Negative.   All other systems reviewed and are negative.   Allergies  Atorvastatin and Latex  Home Medications   Prior to Admission medications   Medication Sig Start Date End Date Taking?  Authorizing Provider  albuterol (PROVENTIL HFA;VENTOLIN HFA) 108 (90 Base) MCG/ACT inhaler Inhale 2 puffs into the lungs every 4 (four) hours as needed for wheezing or shortness of breath. 04/19/16   Janne Napoleon, NP  carvedilol (COREG) 6.25 MG tablet TAKE 1 TABLET (6.25 MG TOTAL) BY MOUTH 2 (TWO) TIMES DAILY WITH A MEAL. 03/02/16 04/01/16  Leeanne Rio, MD  DEXILANT 60 MG capsule TAKE ONE CAPSULE BY MOUTH EVERY DAY 09/25/15   Manus Gunning, MD  furosemide (LASIX) 20 MG tablet Take 1 tablet (20 mg total) by mouth every other day as needed for fluid ((swelling of lower legs)). 12/31/15   Harrie Foreman, MD  lisinopril (PRINIVIL,ZESTRIL) 10 MG tablet Take 1 tablet (10 mg total) by  mouth daily. 01/04/16   Asiyah Cletis Media, MD  mirtazapine (REMERON) 7.5 MG tablet TAKE 1 TABLET (7.5 MG TOTAL) BY MOUTH AT BEDTIME. 03/15/16   Leeanne Rio, MD  Multiple Vitamins-Minerals (MULTIVITAMIN WITH MINERALS) tablet Take 1 tablet by mouth daily.    Historical Provider, MD  traMADol (ULTRAM) 50 MG tablet Take 1 tablet (50 mg total) by mouth 2 (two) times daily. 12/10/15   Wallene Huh, DPM   Meds Ordered and Administered this Visit   Medications  sodium chloride 0.9 % bolus 750 mL (not administered)    BP 106/67   Pulse 93   Temp 100 F (37.8 C)   Resp 16   LMP  (Approximate)   SpO2 96%  No data found.   Physical Exam  Constitutional: She is oriented to person, place, and time. She appears well-developed and well-nourished. No distress.  HENT:  Head: Normocephalic and atraumatic.  Mouth/Throat: No oropharyngeal exudate.  Bilateral TMs are normal. Oropharynx with moderate clear PND. Not well visualized due to tongue retraction.  Eyes: EOM are normal.  Neck: Normal range of motion. Neck supple.  Cardiovascular: Normal rate, regular rhythm, normal heart sounds and intact distal pulses.   Pulmonary/Chest: Effort normal.  Bilateral breath sounds diminished. There chest expansion. No abnormal sounds with tidal volume however with forced expiration there is distant bilateral coarseness.  Musculoskeletal: Normal range of motion. She exhibits no edema.  Lymphadenopathy:    She has no cervical adenopathy.  Neurological: She is alert and oriented to person, place, and time.  Skin: Skin is warm and dry.  Psychiatric: She has a normal mood and affect.  Nursing note and vitals reviewed.   Urgent Care Course   Clinical Course     Procedures (including critical care time)  Labs Review Labs Reviewed  POCT I-STAT, CHEM 8 - Abnormal; Notable for the following:       Result Value   Chloride 100 (*)    Creatinine, Ser 2.00 (*)    Glucose, Bld 138 (*)    Hemoglobin  11.9 (*)    HCT 35.0 (*)    All other components within normal limits    Imaging Review Dg Chest 2 View  Result Date: 04/19/2016 CLINICAL DATA:  Cough, sinus congestion EXAM: CHEST  2 VIEW COMPARISON:  04/17/2012 FINDINGS: Cardiomediastinal silhouette is stable. No infiltrate or pleural effusion. No pulmonary edema. Minimal degenerative changes mid thoracic spine. IMPRESSION: No active cardiopulmonary disease. Electronically Signed   By: Lahoma Crocker M.D.   On: 04/19/2016 12:10     Visual Acuity Review  Right Eye Distance:   Left Eye Distance:   Bilateral Distance:    Right Eye Near:   Left Eye Near:    Bilateral  Near:         MDM   1. Cough   2. Acute upper respiratory infection   3. Dehydration   4. Elevated serum creatinine   5. Other specified hypotension    Patient's blood pressure is slow, 80 systolic. Vitals do not show that she is ortho orthostatic however i-STAT shows that her creatinine is 2 which is doubled over the past 3 months. Passed renal functions have been essentially normal. Administer IV normal saline 750 cc over the next hour. 1320 hrs.: Patient received a total of 900 cc of fluids. She is fully awake and alert with no current complaints. No shortness of breath or current cough. She is to follow-up with her PCP in the next 1-2 days, as soon as possible. See instructions below.  Call your primary care doctor this afternoon to obtain a follow-up appointment for this week. Do not take your blood pressure medicine today or tomorrow except for the one dose of carvedilol 6.25 mg. You may take that one just once a day but do not take it twice a day or take your other one. Increase your fluid intake. Recommend taking Allegra or Zyrtec for drainage in the back of your throat and use the albuterol inhaler to help with the bronchospasm and cough.    Janne Napoleon, NP 04/19/16 1334

## 2016-04-19 NOTE — ED Triage Notes (Signed)
Pt here for cough, congestion that started 2 days ago.

## 2016-04-20 ENCOUNTER — Telehealth: Payer: Self-pay | Admitting: Family Medicine

## 2016-04-20 NOTE — Telephone Encounter (Signed)
Pt called and would like to speak to Dr. Ardelia Mems about her BP issues. She has a cold and went to UC and and they told her to F/U with her PCP. Please call to discuss the issies. jw

## 2016-04-20 NOTE — Telephone Encounter (Signed)
Please call pt back. ep

## 2016-04-20 NOTE — Telephone Encounter (Signed)
Patient has an appointment scheduled for 04/25/16. Nat Christen, CMA

## 2016-04-21 ENCOUNTER — Telehealth: Payer: Self-pay | Admitting: Family Medicine

## 2016-04-21 NOTE — Telephone Encounter (Signed)
Can't call this in without her being seen. See separate phone note - discussed with patient. She will keep appointment for the morning. Leeanne Rio, MD

## 2016-04-21 NOTE — Telephone Encounter (Signed)
Pt called and would like to see if the doctor would call in some eye drops for her. She is having some ear pain.jw

## 2016-04-21 NOTE — Telephone Encounter (Signed)
Spoke with patient, state she has been having bad ear pain and wants something called in for this. Patient informed that she would likely need an appointment and already has one scheduled for tomorrow with Dr. Avon Gully for this issue, still she would like PCP to give her a call.

## 2016-04-21 NOTE — Telephone Encounter (Signed)
See previous phone note.  

## 2016-04-21 NOTE — Telephone Encounter (Signed)
Pt has ear pain and would like to have ear drops called into [facility] on [address]. I did make pt an appointment for tomorrow for ear pain. ep

## 2016-04-21 NOTE — Telephone Encounter (Signed)
Called patient. Ear has been hurting since getting sick over new years. Advised she needs appointment to have this evaluated before we can send something in. Urgent care visit documented normal TMs.  She has appointment tomorrow morning at 8:30a with Dr. Avon Gully. Recommend checking BMET at that visit due to elevated creatinine during recent UC appointment. Patient knows to ask Dr. Avon Gully to check kidney function during that visit.  She also has an appointment with me on Monday. Advised she keep this for routine follow up.  Patient appreciative Leeanne Rio, MD

## 2016-04-22 ENCOUNTER — Encounter: Payer: Self-pay | Admitting: Internal Medicine

## 2016-04-22 ENCOUNTER — Ambulatory Visit (INDEPENDENT_AMBULATORY_CARE_PROVIDER_SITE_OTHER): Payer: Medicare Other | Admitting: Internal Medicine

## 2016-04-22 VITALS — BP 138/90 | HR 88 | Temp 98.9°F | Wt 153.0 lb

## 2016-04-22 DIAGNOSIS — R05 Cough: Secondary | ICD-10-CM | POA: Diagnosis not present

## 2016-04-22 DIAGNOSIS — R7989 Other specified abnormal findings of blood chemistry: Secondary | ICD-10-CM | POA: Diagnosis not present

## 2016-04-22 DIAGNOSIS — H9201 Otalgia, right ear: Secondary | ICD-10-CM | POA: Insufficient documentation

## 2016-04-22 DIAGNOSIS — R059 Cough, unspecified: Secondary | ICD-10-CM | POA: Insufficient documentation

## 2016-04-22 LAB — BASIC METABOLIC PANEL WITH GFR
BUN: 11 mg/dL (ref 7–25)
CALCIUM: 9.1 mg/dL (ref 8.6–10.4)
CO2: 22 mmol/L (ref 20–31)
Chloride: 106 mmol/L (ref 98–110)
Creat: 0.97 mg/dL (ref 0.50–1.05)
GFR, EST AFRICAN AMERICAN: 74 mL/min (ref >=60)
GFR, Est Non African American: 65 mL/min (ref >=60)
Glucose, Bld: 130 mg/dL — ABNORMAL HIGH (ref 65–99)
POTASSIUM: 3.9 mmol/L (ref 3.5–5.3)
Sodium: 141 mmol/L (ref 135–146)

## 2016-04-22 NOTE — Patient Instructions (Addendum)
It was nice meeting you today Regina Sawyer!  Please do not continue to put Vicks VapoRub or BenGay inside your ear. You can rub it on the outside of your ear or behind your ear, but I do not recommend placing it inside your ear. You can continue to take Tylenol to help with your pain.   I do think the albuterol inhaler will help with your coughing. Inhale two puffs every 4-6 hours as needed for coughing. Be sure to use the spacer like I showed you. It may make you feel a little jittery, but this feeling should go away.   We will see you back on Monday for your appointment with Dr. Ardelia Mems. She will discuss your kidney function test at that time.   If you have any questions or concerns, please feel free to call the clinic.   Be well,  Dr. Avon Gully

## 2016-04-22 NOTE — Assessment & Plan Note (Addendum)
Noted increase from 1.0 in 10/27 to 2.0 on 04/19/16. Patient instructed at urgent care to hold lisinopril, and says she is only taking carvedilol for now. Will recheck today. Patient to f/u with PCP Dr. Ardelia Mems on 1/8 and will discuss further then.

## 2016-04-22 NOTE — Progress Notes (Signed)
Subjective:   Patient: Regina Sawyer       Birthdate: 1955-08-18       MRN: [id number]      HPI  Jadah Gabbard is a 61 y.o. female presenting for same day appointment for ear pain.   Ear pain Patient reports R ear pain for the past three days. Describe [place] as throbbing. Has been putting Vicks VapoRub and Ephraim Hamburger insider her ear, which she says relieves the pain. Has also been taking Tylenol which relieves the pain. Endorses subjective fevers, however does not have a thermometer at home. Denies chills. Denies changes in hearing. Denies drainage. Pain is worst with chewing. Pain does not worsen when laying on that side of her head.  Endorses cough and nasal congestion for which she was seen at urgent care on 04/19/16. She was given albuterol inhaler at that time which she said was helpful. Patient says cough is generally improving. Patient also noted to have elevated Cr (up to 2.0 from baseline ~1) at Urgent Care, and was instructed to stop taking her lisinopril until following up with her PCP.   Smoking status reviewed. Patient exposed to second hand smoke but is personally a never smoker.   Review of Systems See HPI.     Objective:  Physical Exam  Constitutional: She is oriented to person, place, and time and well-developed, well-nourished, and in no distress.  HENT:  Head: Normocephalic and atraumatic.  Right Ear: Hearing, tympanic membrane, external ear and ear canal normal. No drainage or tenderness.  Left Ear: Hearing, tympanic membrane, external ear and ear canal normal.  Nose: Nose normal.  Mouth/Throat: Oropharynx is clear and moist. No oropharyngeal exudate.  Eyes: Conjunctivae and EOM are normal. Pupils are equal, round, and reactive to light. Right eye exhibits no discharge. Left eye exhibits no discharge.  Cardiovascular: Normal rate, regular rhythm and normal heart sounds.   No murmur heard. Pulmonary/Chest: Effort normal and breath sounds normal. No respiratory  distress. She has no wheezes.  Neurological: She is alert and oriented to person, place, and time.  Psychiatric: Affect and judgment normal.     Assessment & Plan:  Elevated serum creatinine Noted increase from 1.0 in 10/27 to 2.0 on 04/19/16. Patient instructed at urgent care to hold lisinopril, and says she is only taking carvedilol for now. Will recheck today. Patient to f/u with PCP Dr. Ardelia Mems on 1/8 and will discuss further then.   Right ear pain TM clear bilaterally on exam today and in UC three days ago. No tenderness with manipulation of ear. No changes in hearing, no draining or discharge. Patient afebrile and generally well-appearing. Ear pain likely 2/2 apparent viral URI, as patient has additional symptoms of cough and nasal congestion. With no abnormalities on ear exam, otitis less likely.  - Continue Tylenol PRN - Continue symptomatic treatment for URI as patient has been  - Encouraged patient NOT to put Vicks VapoRub and BenGay inside her ear, but is okay to rub on outside or behind ear  Cough URI vs bronchospasm 2/2 asthma or cold exposure. Lungs CTAB and patient with normal WOB and speaking in full sentences on RA throughout encounter. Patient given albuterol inhaler at Urgent Care which she felt was helpful then. Gave patient spacer and demonstrated how to properly use inhaler, as well as written instructions.  - Albuterol inhaler 2 puffs q4-6 PRN. Spacer provided today.  - Continue symptomatic management   Adin Hector, MD, MPH PGY-2 [facility] Pager  [phone]  

## 2016-04-22 NOTE — Assessment & Plan Note (Signed)
URI vs bronchospasm 2/2 asthma or cold exposure. Lungs CTAB and patient with normal WOB and speaking in full sentences on RA throughout encounter. Patient given albuterol inhaler at Urgent Care which she felt was helpful then. Gave patient spacer and demonstrated how to properly use inhaler, as well as written instructions.  - Albuterol inhaler 2 puffs q4-6 PRN. Spacer provided today.  - Continue symptomatic management

## 2016-04-22 NOTE — Assessment & Plan Note (Addendum)
TM clear bilaterally on exam today and in UC three days ago. No tenderness with manipulation of ear. No changes in hearing, no draining or discharge. Patient afebrile and generally well-appearing. Ear pain likely 2/2 apparent viral URI, as patient has additional symptoms of cough and nasal congestion. With no abnormalities on ear exam, otitis less likely.  - Continue Tylenol PRN - Continue symptomatic treatment for URI as patient has been  - Encouraged patient NOT to put Vicks VapoRub and BenGay inside her ear, but is okay to rub on outside or behind ear

## 2016-04-25 ENCOUNTER — Ambulatory Visit (INDEPENDENT_AMBULATORY_CARE_PROVIDER_SITE_OTHER): Payer: Medicare Other | Admitting: Family Medicine

## 2016-04-25 ENCOUNTER — Encounter: Payer: Self-pay | Admitting: Family Medicine

## 2016-04-25 VITALS — BP 132/80 | HR 70 | Temp 98.4°F | Wt 152.0 lb

## 2016-04-25 DIAGNOSIS — I10 Essential (primary) hypertension: Secondary | ICD-10-CM

## 2016-04-25 DIAGNOSIS — R634 Abnormal weight loss: Secondary | ICD-10-CM

## 2016-04-25 DIAGNOSIS — E785 Hyperlipidemia, unspecified: Secondary | ICD-10-CM | POA: Diagnosis not present

## 2016-04-25 DIAGNOSIS — R05 Cough: Secondary | ICD-10-CM

## 2016-04-25 DIAGNOSIS — R059 Cough, unspecified: Secondary | ICD-10-CM

## 2016-04-25 DIAGNOSIS — R7303 Prediabetes: Secondary | ICD-10-CM | POA: Diagnosis not present

## 2016-04-25 MED ORDER — MIRTAZAPINE 15 MG PO TABS: 15.0000 mg | ORAL_TABLET | Freq: Every day | ORAL | 0 refills | Status: DC

## 2016-04-25 MED ORDER — PREDNISONE 50 MG PO TABS: 50.0000 mg | ORAL_TABLET | Freq: Every day | ORAL | 0 refills | Status: DC

## 2016-04-25 NOTE — Progress Notes (Signed)
This encounter was created in error - please disregard.

## 2016-04-25 NOTE — Patient Instructions (Signed)
On your way out, schedule an appointment one morning to come back for fasting labs. Do not eat or drink anything other than water the morning of your lab appointment until after your labs are drawn.  Go up to 15mg  of remeron at night. Sent in a new prescription for you. Follow up with me in 1 month to check your weight.  For cough, take prednisone 50mg  for 5 days. Sent this in for you. Continue albuterol as needed.  Be well, Dr. Ardelia Mems

## 2016-04-25 NOTE — Progress Notes (Signed)
Date of Visit: 04/25/2016   HPI:  Patient presents for routine follow up.  Hypertension - currently taking coreg 6.25mg  twice daily. Also taking lisinopril 10mg  daily. Notably she was instructed to stop lisinopril at a recent urgent care visit as her Cr was ~2 at that time. She never stopped taking it. Had labs drawn at visit with Dr. Avon Gully last week, with showed resolution of AKI. Denies chest pain or shortness of breath.   Cough - has had a cough that bothers her a lot for the last several days. Was sick last week with URI. Was given albuterol inhaler and spacer. Noted to be wheezing during urgent care visit, also patient feels she is wheezing some at home, especially at night.  Hyperlipidemia - noted on history, not currently on a statin. Due for lipids and would like these checked. Not fasting today.  Weight loss - resolved, has now gained weight on remeron. Last visit we decreased her dose to 7.5mg  daily. She wants to go back up to 15mg  daily as it helps her sleep better. Agreeable to returning to monitor weight regularly.  ROS: See HPI.  [place]: history of hypertension, hemorrhoids, allergic rhinitis, GERD, hyperlipidemia, anxiety/depression, prediabetes  PHYSICAL EXAM: BP 132/80   Pulse 70   Temp 98.4 F (36.9 C) (Oral)   Wt 152 lb (68.9 kg)   LMP  (Approximate)   BMI 26.93 kg/m  Gen: NAD, pleasant, cooperative HEENT: normocephalic, atraumatic, moist mucous membranes. Tympanic membranes clear bilaterally. Oropharynx clear and moist. No anterior cervical or supraclavicular lymphadenopathy. Nares patent Heart: regular rate and rhythm, no murmur Lungs: clear to auscultation bilaterally, normal work of breathing. Occasional cough Neuro: alert grossly nonfocal, speech normal Ext: No appreciable lower extremity edema bilaterally   ASSESSMENT/PLAN:  Health maintenance:  -UTD on health maintenance items  Hyperlipidemia Return for fasting lipids & CMET. Will decide on need  for statin based on that. She has been intolerant to atorvastatin in the past.  Prediabetes Update A1c when she returns for labs  HYPERTENSION, BENIGN ESSENTIAL Well controlled. Continue current regimen. Ok to continue lisinopril since she never actually stopped it after AKI. Recent BMET with normal renal function. Will repeat renal function when she returns for labs.  Weight loss Weight remains stable/elevated. Body mass index is 26.93 kg/m.  Patient would like to go up on remeron dose as it helps her sleep Ok to do this, will go up to 15mg  at night. Follow up in 1 month to monitor weight to ensure not gaining too much.  Cough Persisted cough with some report of wheezing. O2 99% on room air. Continue albuterol inhaler with spacer. Add prednisone 50mg  daily for 5 days to help with some bronchospastic component. If persists longer may need to consider switching off ACE, though doubt need for this at present.  FOLLOW UP: Follow up in 1 month for weight check on remeron  Regina Sawyer, [place]

## 2016-04-27 NOTE — Assessment & Plan Note (Signed)
Persisted cough with some report of wheezing. O2 99% on room air. Continue albuterol inhaler with spacer. Add prednisone 50mg  daily for 5 days to help with some bronchospastic component. If persists longer may need to consider switching off ACE, though doubt need for this at present.

## 2016-04-27 NOTE — Assessment & Plan Note (Signed)
Weight remains stable/elevated. Body mass index is 26.93 kg/m.  Patient would like to go up on remeron dose as it helps her sleep Ok to do this, will go up to 15mg  at night. Follow up in 1 month to monitor weight to ensure not gaining too much.

## 2016-04-27 NOTE — Assessment & Plan Note (Signed)
Well controlled. Continue current regimen. Ok to continue lisinopril since she never actually stopped it after AKI. Recent BMET with normal renal function. Will repeat renal function when she returns for labs.

## 2016-04-27 NOTE — Assessment & Plan Note (Signed)
Update A1c when she returns for labs

## 2016-04-27 NOTE — Assessment & Plan Note (Addendum)
Return for fasting lipids & CMET. Will decide on need for statin based on that. She has been intolerant to atorvastatin in the past.

## 2016-05-11 DIAGNOSIS — F419 Anxiety disorder, unspecified: Secondary | ICD-10-CM | POA: Diagnosis not present

## 2016-05-11 DIAGNOSIS — R6882 Decreased libido: Secondary | ICD-10-CM | POA: Diagnosis not present

## 2016-05-11 DIAGNOSIS — N898 Other specified noninflammatory disorders of vagina: Secondary | ICD-10-CM | POA: Diagnosis not present

## 2016-05-12 ENCOUNTER — Other Ambulatory Visit: Payer: Self-pay | Admitting: Family Medicine

## 2016-05-18 ENCOUNTER — Ambulatory Visit: Payer: Medicare Other | Admitting: *Deleted

## 2016-05-18 DIAGNOSIS — M722 Plantar fascial fibromatosis: Secondary | ICD-10-CM

## 2016-05-18 NOTE — Patient Instructions (Signed)

## 2016-05-21 ENCOUNTER — Other Ambulatory Visit: Payer: Self-pay | Admitting: Family Medicine

## 2016-06-06 ENCOUNTER — Ambulatory Visit (INDEPENDENT_AMBULATORY_CARE_PROVIDER_SITE_OTHER): Payer: Medicare Other | Admitting: Podiatry

## 2016-06-06 ENCOUNTER — Ambulatory Visit: Payer: Self-pay | Admitting: Family Medicine

## 2016-06-06 DIAGNOSIS — M722 Plantar fascial fibromatosis: Secondary | ICD-10-CM

## 2016-06-06 DIAGNOSIS — M79671 Pain in right foot: Secondary | ICD-10-CM | POA: Diagnosis not present

## 2016-06-06 MED ORDER — MELOXICAM 15 MG PO TABS: 15.0000 mg | ORAL_TABLET | Freq: Every day | ORAL | 1 refills | Status: AC

## 2016-06-06 NOTE — Patient Instructions (Signed)

## 2016-06-09 ENCOUNTER — Telehealth: Payer: Self-pay | Admitting: Family Medicine

## 2016-06-09 NOTE — Telephone Encounter (Signed)
Pt needs a referral to see dr Lenon Curt about her hand.   [phone]

## 2016-06-10 NOTE — Telephone Encounter (Signed)
Please find out more info. I don't know of anything being wrong with her hand. If it's a new problem she should come in for a visit first.  Leeanne Rio, MD

## 2016-06-13 ENCOUNTER — Encounter: Payer: Self-pay | Admitting: Family Medicine

## 2016-06-13 ENCOUNTER — Ambulatory Visit (INDEPENDENT_AMBULATORY_CARE_PROVIDER_SITE_OTHER): Payer: Medicare Other | Admitting: Family Medicine

## 2016-06-13 VITALS — BP 142/88 | HR 59 | Temp 98.4°F | Ht 63.0 in | Wt 155.0 lb

## 2016-06-13 DIAGNOSIS — I10 Essential (primary) hypertension: Secondary | ICD-10-CM

## 2016-06-13 DIAGNOSIS — R29898 Other symptoms and signs involving the musculoskeletal system: Secondary | ICD-10-CM

## 2016-06-13 DIAGNOSIS — Z7689 Persons encountering health services in other specified circumstances: Secondary | ICD-10-CM | POA: Diagnosis not present

## 2016-06-13 MED ORDER — LISINOPRIL 20 MG PO TABS: 20.0000 mg | ORAL_TABLET | Freq: Every day | ORAL | 0 refills | Status: DC

## 2016-06-13 MED ORDER — MIRTAZAPINE 15 MG PO TABS: 15.0000 mg | ORAL_TABLET | Freq: Every day | ORAL | 0 refills | Status: DC

## 2016-06-13 NOTE — Progress Notes (Signed)
Date of Visit: 06/13/2016   HPI:  Patient presents for follow up of blood pressure and to have referral placed for her finger.  Hypertension - taking lisinopril 10mg  daily and coreg 6.25mg  twice daily. Tolerating these well. No chest pain or shortness of breath. No swelling.  Sleep - taking remeron 15mg  at night to help with sleep. Weight up 3lb since last visit. remeron is helping with sleep.  Hand - wants referral to Dr. Lenon Curt of hand surgery. Has seen him before for trigger finger. Having issues with one of her fingers.  ROS: See HPI.  [place]: history of hyperlipidemia, hypertension, GERD, sickle cell trait  PHYSICAL EXAM: BP (!) 142/88   Pulse (!) 59   Temp 98.4 F (36.9 C) (Oral)   Ht 5\' 3"  (1.6 m)   Wt 155 lb (70.3 kg)   BMI 27.46 kg/m  Gen: no acute distress, pleasant, cooperative HEENT: normocephalic, atraumatic, moist mucous membranes  Heart: regular rate and rhythm, no murmur Lungs: clear to auscultation bilaterally normal work of breathing  Neuro: alert grossly nonfocal speech normal Ext: No appreciable lower extremity edema bilaterally. Hands atraumatic, no swelling or redness. Some difficulty with flexing R 5th digit  ASSESSMENT/PLAN:  HYPERTENSION, BENIGN ESSENTIAL Above goal slightly Increase lisinopril to 20mg  daily Check fasting labs later this week to assess renal function & creatinine.  Sleep concern Doing well with remeron but now weight up 3lb on higher dose Discussed recommendations for diet & exercise Follow up in 1 month to recheck weight   Hand issue Possibly new trigger finger. Patient prefers to see her prior hand doctor. Will enter referral  FOLLOW UP: Follow up in 1 mo for hypertension & weight recheck Referring to hand doctor  [place] J. Ardelia Mems, [place]

## 2016-06-13 NOTE — Telephone Encounter (Signed)
Patient has appointment today to discuss

## 2016-06-13 NOTE — Patient Instructions (Signed)
Increase lisinopril to 20mg  daily Sent in a new prescription  Refilled remeron Work on exercise & healthy eating to manage weight Follow up in 1 month  On your way out, schedule an appointment one morning to come back for fasting labs. Do not eat or drink anything other than water the morning of your lab appointment until after your labs are drawn. Do this later this week.  Will enter referral to Dr. Lenon Curt  Be well, Dr. Ardelia Mems

## 2016-06-14 NOTE — Progress Notes (Signed)
   Subjective: Patient presents today for follow-up treatment and evaluation of chronic plantar fasciitis. Patient was last seen under the care of Dr. Paulla Dolly on 03/30/2016. Patient states that he has had pain in his heels for the past 2 weeks. The pain comes and goes and it is worse with standing. Alleviated by rest. Patient states that the orthotics hurt. Patient complains of the right foot is more symptomatic than the left.  Objective: Physical Exam General: The patient is alert and oriented x3 in no acute distress.  Dermatology: Skin is cool, dry and supple bilateral lower extremities. Negative for open lesions or macerations.  Vascular: Palpable pedal pulses bilaterally. No edema or erythema noted. Capillary refill within normal limits.  Neurological: Epicritic and protective threshold grossly intact bilaterally.   Musculoskeletal Exam: Pain on palpation noted to the medial calcaneal tubercle of the bilateral lower extremities at the insertion of the plantar fascia more symptomatic on the right. All pedal and ankle joints range of motion within normal limits bilateral. Muscle strength 5/5 in all groups bilateral.    Assessment: 1. Plantar fasciitis right 2. Pain in right foot  Plan of Care:  1. Patient evaluated. Xrays reviewed.   2. today the patient does not want another anti-inflammatory injection. 3. Prescription for meloxicam 15 mg 4. Continue wearing custom molded orthotics and break-in period.   5. Return to clinic when necessary  Edrick Kins, DPM [facility]  Dr. Edrick Kins, [place]                                        [place] [place], [place] [address]                Office [phone]  Fax [phone]

## 2016-06-16 ENCOUNTER — Telehealth: Payer: Self-pay | Admitting: Family Medicine

## 2016-06-16 DIAGNOSIS — Z7689 Persons encountering health services in other specified circumstances: Secondary | ICD-10-CM | POA: Insufficient documentation

## 2016-06-16 NOTE — Telephone Encounter (Signed)
Pt says she discussed her finger problem with Dr Ardelia Mems on Monday.  She understood dr was going to make a referral to a finger dr but she hasnt heard anything yet. Please advise

## 2016-06-16 NOTE — Assessment & Plan Note (Signed)
Doing well with remeron but now weight up 3lb on higher dose Discussed recommendations for diet & exercise Follow up in 1 month to recheck weight

## 2016-06-16 NOTE — Telephone Encounter (Signed)
2nd request. ep °

## 2016-06-16 NOTE — Assessment & Plan Note (Signed)
Above goal slightly Increase lisinopril to 20mg  daily Check fasting labs later this week to assess renal function & creatinine.

## 2016-06-16 NOTE — Telephone Encounter (Signed)
Disregard my message. I saw referral was put in @ 4:11pm today. Thank you. ep

## 2016-07-09 ENCOUNTER — Other Ambulatory Visit: Payer: Self-pay | Admitting: Family Medicine

## 2016-07-12 ENCOUNTER — Ambulatory Visit: Payer: Self-pay | Admitting: Family Medicine

## 2016-07-13 NOTE — Telephone Encounter (Signed)
2nd request.  Neila Teem L, RN  

## 2016-07-14 NOTE — Telephone Encounter (Signed)
Red team, Please let patient know I will send in one more month of her lisinopril but she absolutely must come in for labwork - she was supposed to do this after her last visit. We need to recheck kidney function on higher dose of lisinopril.  Thanks Leeanne Rio, MD

## 2016-07-14 NOTE — Telephone Encounter (Signed)
Patient is aware and scheduled lab visit for 07-19-16. Regina Sawyer,CMA

## 2016-07-19 ENCOUNTER — Other Ambulatory Visit (INDEPENDENT_AMBULATORY_CARE_PROVIDER_SITE_OTHER): Payer: 59

## 2016-07-19 DIAGNOSIS — R7303 Prediabetes: Secondary | ICD-10-CM | POA: Diagnosis not present

## 2016-07-19 DIAGNOSIS — E785 Hyperlipidemia, unspecified: Secondary | ICD-10-CM

## 2016-07-19 LAB — POCT GLYCOSYLATED HEMOGLOBIN (HGB A1C): Hemoglobin A1C: 5.7

## 2016-07-20 LAB — LIPID PANEL
CHOL/HDL RATIO: 4 ratio (ref 0.0–4.4)
Cholesterol, Total: 211 mg/dL — ABNORMAL HIGH (ref 100–199)
HDL: 53 mg/dL (ref >39)
LDL CALC: 133 mg/dL — AB (ref 0–99)
TRIGLYCERIDES: 124 mg/dL (ref 0–149)
VLDL Cholesterol Cal: 25 mg/dL (ref 5–40)

## 2016-07-20 LAB — COMPREHENSIVE METABOLIC PANEL
A/G RATIO: 1.3 (ref 1.2–2.2)
ALT: 10 IU/L (ref 0–32)
AST: 15 IU/L (ref 0–40)
Albumin: 4 g/dL (ref 3.5–5.5)
Alkaline Phosphatase: 112 IU/L (ref 39–117)
BUN/Creatinine Ratio: 12 (ref 9–23)
BUN: 13 mg/dL (ref 6–24)
CALCIUM: 9.4 mg/dL (ref 8.7–10.2)
CHLORIDE: 102 mmol/L (ref 96–106)
CO2: 26 mmol/L (ref 18–29)
Creatinine, Ser: 1.09 mg/dL — ABNORMAL HIGH (ref 0.57–1.00)
GFR calc Af Amer: 65 mL/min/{1.73_m2} (ref >59)
GFR, EST NON AFRICAN AMERICAN: 56 mL/min/{1.73_m2} — AB (ref >59)
Globulin, Total: 3 g/dL (ref 1.5–4.5)
Glucose: 113 mg/dL — ABNORMAL HIGH (ref 65–99)
POTASSIUM: 4.9 mmol/L (ref 3.5–5.2)
Sodium: 142 mmol/L (ref 134–144)
Total Protein: 7 g/dL (ref 6.0–8.5)

## 2016-07-21 ENCOUNTER — Telehealth: Payer: Self-pay | Admitting: *Deleted

## 2016-07-21 NOTE — Telephone Encounter (Signed)
Patient is calling regarding her labs from 07-19-16.  Informed of the few that were out of range and that she could discuss these in more detail at her upcoming appointment or her PCP might call her to talk about them before then.  Patient voiced understanding. Mariella Blackwelder,CMA

## 2016-07-22 ENCOUNTER — Encounter: Payer: Self-pay | Admitting: Family Medicine

## 2016-07-22 NOTE — Telephone Encounter (Signed)
I have sent her a letter. Overall labs look good. Will discuss at her upcoming appointment. She might benefit from cholesterol medication but previously didn't tolerate lipitor, so I want to talk with her in person about this.  Thanks Leeanne Rio, MD

## 2016-07-26 ENCOUNTER — Other Ambulatory Visit: Payer: Self-pay | Admitting: Family Medicine

## 2016-08-09 ENCOUNTER — Encounter: Payer: Self-pay | Admitting: Family Medicine

## 2016-08-09 ENCOUNTER — Ambulatory Visit (INDEPENDENT_AMBULATORY_CARE_PROVIDER_SITE_OTHER): Payer: 59 | Admitting: Family Medicine

## 2016-08-09 DIAGNOSIS — I10 Essential (primary) hypertension: Secondary | ICD-10-CM

## 2016-08-09 DIAGNOSIS — E785 Hyperlipidemia, unspecified: Secondary | ICD-10-CM

## 2016-08-09 DIAGNOSIS — Z7689 Persons encountering health services in other specified circumstances: Secondary | ICD-10-CM | POA: Diagnosis not present

## 2016-08-09 NOTE — Patient Instructions (Addendum)
Stop taking the weight gain supplement Follow up with me in 6 weeks to see how your weight is  Otherwise continue current medications  Be well, Dr. Ardelia Mems

## 2016-08-09 NOTE — Progress Notes (Signed)
Date of Visit: 08/09/2016   HPI:  Lipid results - discussed with patient today that her overall cardiovascular risk suggests she may benefit from a statin but she did not want to do this, as she previously did not tolerate lipitor. Will rescreen lipids in future as appropriate. Also informed her of prediabetes dx today.  Hypertension - currently taking lisinopril 20mg  daily and coreg 6.25mg  twice daily. Tolerating well. No chest pain, shortness of breath, swelling.   Weight gain - taking remeron to help her sleep, which is going well. She has gained weight on it however. On further questioning though, patient admits to taking an over the counter supplement called "CB Weight Gain" in order to help her gain weight. She is willing to stop this especially as her A1c was in the prediabetes range.  ROS: See HPI.  [place]: history of hyperlipidemia, hypertension, GERD, anxiety  PHYSICAL EXAM: BP 128/88   Pulse 67   Temp 98.7 F (37.1 C) (Oral)   Ht 5\' 3"  (1.6 m)   Wt 159 lb 12.8 oz (72.5 kg)   SpO2 99%   BMI 28.31 kg/m  Gen: no acute distress, pleasant, cooperative HEENT: normocephalic, atraumatic  Heart: regular rate and rhythm Lungs: clear to auscultation bilaterally normal work of breathing  Neuro: alert, grossly nonfocal, speech normal Ext: No appreciable lower extremity edema bilaterally   ASSESSMENT/PLAN:  Hyperlipidemia Patient declined statin today. rescreen lipids in future as appropriate.  HYPERTENSION, BENIGN ESSENTIAL Well controlled. Continue current regimen.   Sleep concern Stable on remeron, but weight is notably up. Discussed with patient that supplements like this "CB Weight Gain" supplement are not regulated by the FDA, and that I recommend she stop the supplement. She will do this. Follow up in 6 wks to recheck weight & ensure stable. May need to decrease remeron or possibly switch to trazodone.  FOLLOW UP: Follow up in 6 weeks for weight recheck  [place] J.  Ardelia Mems, [place]

## 2016-08-11 ENCOUNTER — Telehealth: Payer: Self-pay

## 2016-08-11 MED ORDER — LISINOPRIL 20 MG PO TABS: 20.0000 mg | ORAL_TABLET | Freq: Every day | ORAL | 1 refills | Status: DC

## 2016-08-11 MED ORDER — MIRTAZAPINE 15 MG PO TABS: 15.0000 mg | ORAL_TABLET | Freq: Every day | ORAL | 1 refills | Status: DC

## 2016-08-11 NOTE — Telephone Encounter (Signed)
rx sent in Dalessandro Baldyga J Rahma Meller, MD  

## 2016-08-11 NOTE — Telephone Encounter (Signed)
Pt calling for refill of lisinopril and mirtazapine. [facility]. She is almost out of the medication.  Ottis Stain, CMA

## 2016-08-12 NOTE — Assessment & Plan Note (Signed)
Stable on remeron, but weight is notably up. Discussed with patient that supplements like this "CB Weight Gain" supplement are not regulated by the FDA, and that I recommend she stop the supplement. She will do this. Follow up in 6 wks to recheck weight & ensure stable. May need to decrease remeron or possibly switch to trazodone.

## 2016-08-12 NOTE — Assessment & Plan Note (Signed)
Well-controlled.  Continue current regimen. 

## 2016-08-12 NOTE — Assessment & Plan Note (Signed)
Patient declined statin today. rescreen lipids in future as appropriate.

## 2016-08-13 ENCOUNTER — Other Ambulatory Visit: Payer: Self-pay | Admitting: Family Medicine

## 2016-08-24 ENCOUNTER — Ambulatory Visit: Payer: 59 | Admitting: Podiatry

## 2016-08-30 ENCOUNTER — Encounter: Payer: Self-pay | Admitting: Podiatry

## 2016-08-30 ENCOUNTER — Ambulatory Visit (INDEPENDENT_AMBULATORY_CARE_PROVIDER_SITE_OTHER): Payer: 59 | Admitting: Podiatry

## 2016-08-30 DIAGNOSIS — M722 Plantar fascial fibromatosis: Secondary | ICD-10-CM

## 2016-08-30 MED ORDER — METHYLPREDNISOLONE 4 MG PO TBPK: ORAL_TABLET | ORAL | 0 refills | Status: DC

## 2016-08-30 MED ORDER — CELECOXIB 200 MG PO CAPS
200.0000 mg | ORAL_CAPSULE | Freq: Every day | ORAL | 2 refills | Status: DC
Start: 2016-08-30 — End: 2016-10-28

## 2016-08-30 NOTE — Patient Instructions (Signed)

## 2016-08-31 NOTE — Progress Notes (Signed)
She presents today with her husband states that her heel is hurting so badly she can hardly stand it.  Objective: Vital signs are stable she's alert and oriented 3. Pulses are palpable neurologic sensorium is intact. She has pain on palpation medial calcaneal tubercle of the right heel.  Assessment: Plantar fascitis bilateral  Plan: Injected the bilateral heel today started on a Medrol Dosepak to be followed by meloxicam. Plantar fascia brace and night splint. Medrol Dosepak

## 2016-09-07 ENCOUNTER — Ambulatory Visit: Payer: 59 | Admitting: Podiatry

## 2016-09-19 ENCOUNTER — Ambulatory Visit: Payer: Self-pay | Admitting: Family Medicine

## 2016-09-26 ENCOUNTER — Other Ambulatory Visit: Payer: Self-pay | Admitting: *Deleted

## 2016-09-26 MED ORDER — MIRTAZAPINE 15 MG PO TABS: 15.0000 mg | ORAL_TABLET | Freq: Every day | ORAL | 0 refills | Status: DC

## 2016-10-14 ENCOUNTER — Other Ambulatory Visit: Payer: Self-pay | Admitting: Family Medicine

## 2016-10-18 ENCOUNTER — Ambulatory Visit: Payer: 59 | Admitting: Podiatry

## 2016-10-21 ENCOUNTER — Ambulatory Visit: Payer: 59 | Admitting: Family Medicine
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
## 2016-10-25 ENCOUNTER — Other Ambulatory Visit: Payer: Self-pay | Admitting: Family Medicine

## 2016-10-27 ENCOUNTER — Telehealth: Payer: Self-pay | Admitting: Podiatry

## 2016-10-27 NOTE — Telephone Encounter (Signed)
Pt called is having bil heel pain and is scheduled to see Dr Amalia Hailey on 7.25.18 but was asking for pain medication refill. She has seen Dr Milinda Pointer last

## 2016-10-28 MED ORDER — CELECOXIB 200 MG PO CAPS: 200.0000 mg | ORAL_CAPSULE | Freq: Every day | ORAL | 0 refills | Status: DC

## 2016-10-28 NOTE — Telephone Encounter (Signed)
I informed pt that Dr. Milinda Pointer standing orders okayed the celebrex refill once only, and to begin ice therapy 3-4 times daily for 15-20 minutes per session protecting the skin from the ice pack with cloth, and to keep the 11/09/2016 appt.

## 2016-11-09 ENCOUNTER — Encounter: Payer: Self-pay | Admitting: Podiatry

## 2016-11-09 ENCOUNTER — Ambulatory Visit (INDEPENDENT_AMBULATORY_CARE_PROVIDER_SITE_OTHER): Payer: 59

## 2016-11-09 ENCOUNTER — Telehealth: Payer: Self-pay | Admitting: Podiatry

## 2016-11-09 ENCOUNTER — Ambulatory Visit (INDEPENDENT_AMBULATORY_CARE_PROVIDER_SITE_OTHER): Payer: 59 | Admitting: Podiatry

## 2016-11-09 DIAGNOSIS — M722 Plantar fascial fibromatosis: Secondary | ICD-10-CM

## 2016-11-09 MED ORDER — TRAMADOL HCL 50 MG PO TABS: 50.0000 mg | ORAL_TABLET | Freq: Three times a day (TID) | ORAL | 0 refills | Status: DC | PRN

## 2016-11-09 NOTE — Telephone Encounter (Signed)
I was there today and the doctor put this on my foot, this black brace to hold my heel up. The sticky part doesn't want to stick right. It keeps coming a loose. I wanted to know if can come get another one. Please call me at [phone].

## 2016-11-09 NOTE — Telephone Encounter (Signed)
I told pt I would help her with the brace or replace the brace if she would come to the office 11/10/2016.

## 2016-11-09 NOTE — Addendum Note (Signed)
Addended by: Roney Jaffe on: 11/09/2016 10:15 AM   Modules accepted: Orders

## 2016-11-09 NOTE — Progress Notes (Signed)
   Subjective: Patient presents today for follow-up treatment and evaluation of bilateral plantar fasciitis. Patient was last seen on 08/30/2016 under the care of Dr. Milinda Pointer. At that time plantar fascial braces and night splints were dispensed. Patient states that she does not know how to put on the plantar fascial braces appropriately. Patient also states that the injections helped for approximately 2 days.  Objective: Physical Exam General: The patient is alert and oriented x3 in no acute distress.  Dermatology: Skin is warm, dry and supple bilateral lower extremities. Negative for open lesions or macerations bilateral.   Vascular: Dorsalis Pedis and Posterior Tibial pulses palpable bilateral.  Capillary fill time is immediate to all digits.  Neurological: Epicritic and protective threshold intact bilateral.   Musculoskeletal: Tenderness to palpation at the medial calcaneal tubercale and through the insertion of the plantar fascia of the bilateral feet. All other joints range of motion within normal limits bilateral. Strength 5/5 in all groups bilateral.   Radiographic exam: Normal osseous mineralization. Joint spaces preserved. No fracture/dislocation/boney destruction. Calcaneal spur present with mild thickening of plantar fascia bilateral. No other soft tissue abnormalities or radiopaque foreign bodies.   Assessment: 1. plantar fasciitis bilateral feet  Plan of Care:  1. Patient evaluated. Xrays reviewed.   2. Today I demonstrated personally how to apply and wear the plantar fascial braces. Patient understands. 3. Continue night splints 4. Patient cannot tolerate oral anti-inflammatories. Today we can write a prescription for tramadol 50 mg #60 5. Return to clinic in 4 weeks  Edrick Kins, DPM [facility]  Dr. Edrick Kins, DPM    [address], [place] [address]                Office [phone]  Fax [phone]

## 2016-11-16 ENCOUNTER — Encounter: Payer: Self-pay | Admitting: Family Medicine

## 2016-11-16 ENCOUNTER — Ambulatory Visit (INDEPENDENT_AMBULATORY_CARE_PROVIDER_SITE_OTHER): Payer: 59 | Admitting: Family Medicine

## 2016-11-16 DIAGNOSIS — F419 Anxiety disorder, unspecified: Secondary | ICD-10-CM

## 2016-11-16 DIAGNOSIS — F329 Major depressive disorder, single episode, unspecified: Secondary | ICD-10-CM | POA: Diagnosis not present

## 2016-11-16 DIAGNOSIS — I10 Essential (primary) hypertension: Secondary | ICD-10-CM | POA: Diagnosis not present

## 2016-11-16 DIAGNOSIS — Z7689 Persons encountering health services in other specified circumstances: Secondary | ICD-10-CM

## 2016-11-16 MED ORDER — MIRTAZAPINE 15 MG PO TABS: 15.0000 mg | ORAL_TABLET | Freq: Every day | ORAL | 1 refills | Status: DC

## 2016-11-16 NOTE — Progress Notes (Signed)
Date of Visit: 11/16/2016   HPI:  Patient presents to follow up on weight and blood pressure.  Weight - taking remeron 15mg  at night to help with sleep, so we are monitoring her weight closely. She previously was taking a weight gain supplement, but has stopped. Weight stable since last visit. remeron helps her sleep, likes the 15mg  dose better.  Hypertension - currently taking lisinopril 20mg  daily and carvedilol 6.25mg  twice daily. Tolerating both of these well. Denies chest pain, shortness of breath, swelling. Took medications this morning but thinks blood pressure is elevated due to stress.  Stress - feeling much more anxious lately. Husband accompanies her to the visit and they are arguing throughout. Eventually patient stopped talking and I was able to ask her husband to step out so that just she and I can meet. She reports husband has been diagnosed with genital warts and this causes her to not want to be sexually active with him, but he frequently brings up sex with her, which is upsetting. Denies thoughts of harming herself or others. She thinks having a counselor to discuss things would be helpful.  ROS: See HPI.  [place]: history of hyperlipidemia, hypertension, GERD, anxiety/depression  PHYSICAL EXAM: BP (!) 162/98   Pulse 79   Temp 98.5 F (36.9 C) (Oral)   Ht 5\' 3"  (1.6 m)   Wt 158 lb (71.7 kg)   BMI 27.99 kg/m  Gen: no acute distress, pleasant, cooperative Psych: affect irritable and stressed. No SI/HI. Mood anxious. Speech normal in rate and volume. Mildly decreased eye contact.  ASSESSMENT/PLAN:  Health maintenance:  -UTD on HM items  HYPERTENSION, BENIGN ESSENTIAL Blood pressure elevated above goal today, but patient upset and stressed. She will follow up for a nurse blood pressure check in the next few weeks on a day that she is more calm.  Sleep concern Sleeping well with use of remeron 15mg  nightly. Weight stable. Continue current regimen.  Anxiety and  depression No suicidal ideation/HI. Would benefit from counseling given marital stress with her husband. Attempted to involve [facility] today but they were not available. Will message [facility] team to contact patient & schedule an appointment. Patient agreeable & appreciative.  FOLLOW UP: Follow up in next week or so for nurse BP check To meet with [facility], contact first via phone See me in 6 weeks  [place] J. Ardelia Mems, [place]

## 2016-11-16 NOTE — Assessment & Plan Note (Signed)
Sleeping well with use of remeron 15mg  nightly. Weight stable. Continue current regimen.

## 2016-11-16 NOTE — Assessment & Plan Note (Signed)
Blood pressure elevated above goal today, but patient upset and stressed. She will follow up for a nurse blood pressure check in the next few weeks on a day that she is more calm.

## 2016-11-16 NOTE — Assessment & Plan Note (Signed)
No suicidal ideation/HI. Would benefit from counseling given marital stress with her husband. Attempted to involve [facility] today but they were not available. Will message [facility] team to contact patient & schedule an appointment. Patient agreeable & appreciative.

## 2016-11-16 NOTE — Patient Instructions (Signed)
One of our counselors will call you to set up a time to meet.  Blood pressure was high today, but probably due to stress Schedule a nurse blood pressure check one day when you are feeling calmer, to see what your blood pressure is then.  Follow up with me in 6 weeks, sooner if needed.  Be well, Dr. Ardelia Mems

## 2016-11-17 ENCOUNTER — Telehealth: Payer: Self-pay | Admitting: Licensed Clinical Social Worker

## 2016-11-17 NOTE — Progress Notes (Signed)
LCSW received voice message from patient requesting to schedule an appointment with [facility].   Returned call. Patient was referred by Dr. Ardelia Mems, she reports having a lot anxiety and needs to talk.   Patient requested to come in new Wed.   Appointment scheduled Wed. Aug. 8th at 10:30.  Casimer Lanius, LCSW Licensed Clinical Social Worker [place]   [phone] 10:47 AM

## 2016-11-22 ENCOUNTER — Telehealth: Payer: Self-pay | Admitting: Gastroenterology

## 2016-11-22 ENCOUNTER — Other Ambulatory Visit: Payer: Self-pay

## 2016-11-22 MED ORDER — DEXLANSOPRAZOLE 60 MG PO CPDR: 1.0000 | DELAYED_RELEASE_CAPSULE | Freq: Every day | ORAL | 0 refills | Status: DC

## 2016-11-22 NOTE — Telephone Encounter (Signed)
Pt was last seen 05/2015. Are you ok to refill?

## 2016-11-22 NOTE — Telephone Encounter (Signed)
We can refill it. She should be seen in clinic for a follow up visit however if you can help coordinate in the upcoming months. thanks

## 2016-11-22 NOTE — Telephone Encounter (Signed)
#  90 sent with 0 refills. Instructed pharmacy to inform pt to call for an appt

## 2016-11-23 ENCOUNTER — Ambulatory Visit (INDEPENDENT_AMBULATORY_CARE_PROVIDER_SITE_OTHER): Payer: 59 | Admitting: Licensed Clinical Social Worker

## 2016-11-23 DIAGNOSIS — F439 Reaction to severe stress, unspecified: Secondary | ICD-10-CM
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
## 2016-11-24 DIAGNOSIS — Z1231 Encounter for screening mammogram for malignant neoplasm of breast: Secondary | ICD-10-CM | POA: Diagnosis not present

## 2016-11-30 ENCOUNTER — Ambulatory Visit: Payer: 59

## 2016-12-01 ENCOUNTER — Ambulatory Visit: Payer: 59

## 2016-12-07 ENCOUNTER — Ambulatory Visit: Payer: 59 | Admitting: Podiatry

## 2016-12-07 ENCOUNTER — Ambulatory Visit: Payer: 59

## 2016-12-09 ENCOUNTER — Telehealth: Payer: Self-pay | Admitting: Family Medicine

## 2016-12-09 ENCOUNTER — Telehealth: Payer: Self-pay | Admitting: Licensed Clinical Social Worker

## 2016-12-09 NOTE — Telephone Encounter (Signed)
We've discussed this before. I don't prescribe diet pills. Please let her know. Thanks, Leeanne Rio, MD

## 2016-12-09 NOTE — Progress Notes (Signed)
[facility] f/u phone call to patient reference interventions discussed and resources provided during [place] visit . Patient.  Patient scheduled two [facility] appointments and did not show for the appointments.  Patient reports she is doing well but believes she need medication to help with her anxiety.       Plan: Patient will call front office and schedule an appointment with her PCP.  She will schedule appointment with [facility] if needed.  Casimer Lanius, LCSW Licensed Clinical Social Worker [facility]   [phone] 3:55 PM

## 2016-12-09 NOTE — Telephone Encounter (Signed)
Pt called and would like the doctor to prescribe some diet pills for her.

## 2016-12-10 ENCOUNTER — Encounter (HOSPITAL_COMMUNITY): Payer: Self-pay | Admitting: Family Medicine

## 2016-12-10 ENCOUNTER — Ambulatory Visit (HOSPITAL_COMMUNITY)
Admission: EM | Admit: 2016-12-10 | Discharge: 2016-12-10 | Disposition: A | Discharge status: Home / Self Care | Payer: 59 | Attending: Family Medicine | Admitting: Family Medicine

## 2016-12-10 DIAGNOSIS — J069 Acute upper respiratory infection, unspecified: Secondary | ICD-10-CM

## 2016-12-10 DIAGNOSIS — B9789 Other viral agents as the cause of diseases classified elsewhere: Secondary | ICD-10-CM

## 2016-12-10 MED ORDER — HYDROCODONE-HOMATROPINE 5-1.5 MG/5ML PO SYRP: 5.0000 mL | ORAL_SOLUTION | Freq: Four times a day (QID) | ORAL | 0 refills | Status: DC | PRN

## 2016-12-10 NOTE — ED Provider Notes (Signed)
  [place]   [id number] 12/10/16 Arrival Time: 1478  ASSESSMENT & PLAN:  1. Viral URI with cough    Meds ordered this encounter  Medications  . HYDROcodone-homatropine (HYCODAN) 5-1.5 MG/5ML syrup    Sig: Take 5 mLs by mouth every 6 (six) hours as needed for cough.    Dispense:  90 mL    Refill:  0   OTC analgesics and symptom care as needed. Medication sedation precautions. May f/u as needed.  Reviewed expectations re: course of current medical issues. Questions answered. Outlined signs and symptoms indicating need for more acute intervention. Patient verbalized understanding. After Visit Summary given.   SUBJECTIVE:  Regina Sawyer is a 61 y.o. female who presents with complaint of nasal congestion, post-nasal drainage, ear discomfort, and coughing for 3 days. Acute onset. Husband with the same. No fever reported. Decreased sleep secondary to coughing. No SOB or wheezing. Non-smoker. No n/v. OTC medications without relief.  ROS: As per HPI.   OBJECTIVE:  Vitals:   12/10/16 1212  BP: (!) 150/66  Pulse: 73  Resp: 18  Temp: 99.1 F (37.3 C)  TempSrc: Oral  SpO2: 100%     General appearance: alert; no distress HEENT: nasal congestion; clear runny nose; throat irritation secondary to post-nasal drainage; TMs appear normal; R TM slightly bulging but clear and without erythema Neck: supple without LAD Lungs: clear to auscultation bilaterally Skin: warm and dry Psychological:  alert and cooperative; normal mood and affect    Allergies  Allergen Reactions  . Atorvastatin Other (See Comments)    Abdominal pain, severe nausea, muscle / body aches  . Lactose   . Latex Hives, Itching and Rash    Past Medical History:  Diagnosis Date  . Asthma   . Atrophic vaginitis 2009  . BV (bacterial vaginosis) 2005  . Candida vaginitis 2006  . Candida vaginitis 2009  . Depression   . Dysuria 2007  . Female pelvic pain 2006  . Fibroid 2005  . GERD  (gastroesophageal reflux disease)   . H/O constipation 2009  . H/O dyspareunia 2008  . H/O urinary frequency 2011  . H/O vaginitis 2005  . Heart murmur   . History of bacterial infection   . History of hemorrhoids 2009  . Hx: UTI (urinary tract infection) 2007  . Hyperlipidemia   . Hypertension   . Irregular periods/menstrual cycles 2007  . Libido, decreased 2006  . Menopausal symptoms 2007  . Nocturia 2009  . Proteinuria 2009  . Sickle cell trait ([place])   . Tenderness of female pelvic organs 2005  . Ulcer   . Vaginal irritation 2011           Vanessa Kick, MD 12/10/16 1240

## 2016-12-10 NOTE — ED Triage Notes (Signed)
C/O cough, nasal congestion, right earache, runny nose x 3 days.

## 2016-12-12 ENCOUNTER — Telehealth: Payer: Self-pay | Admitting: Family Medicine

## 2016-12-12 NOTE — Telephone Encounter (Signed)
Pt is calling because she is really needing anxiety medication. That is what she meant last week when she called She didn't realize that's what she she said. jw

## 2016-12-14 ENCOUNTER — Ambulatory Visit: Payer: 59 | Admitting: Podiatry

## 2016-12-14 NOTE — Telephone Encounter (Signed)
Called patient, no answer, no voicemail. If patient calls back please inform her that she will need an appointment before anything can be prescribed.

## 2016-12-22 ENCOUNTER — Ambulatory Visit (INDEPENDENT_AMBULATORY_CARE_PROVIDER_SITE_OTHER): Payer: 59 | Admitting: Licensed Clinical Social Worker

## 2016-12-22 DIAGNOSIS — F439 Reaction to severe stress, unspecified: Secondary | ICD-10-CM

## 2016-12-22 NOTE — Progress Notes (Signed)
  Total time:30 minutes Type of Service: Integrated Behavioral Health F/U Interpretor:No.    SUBJECTIVE: Regina Sawyer is a 61 y.o. female   Reason for follow-up: Continue brief interventions to address symptoms associated with anxiety. Reports no impairment in daily functions at work. She only has symptoms at home. Patient presented sad, irritable and anxious when talking about the relationship with her husband.   PHQ 9=2 no indication of clinical depression  LIFE CONTEXT:  Family & Social: lives with husband of 65 years  School/ Work: works PT at [employer] changes: relationship stressors with husband.   GOALS:  Patient will reduce symptoms of: anxiety and stress  And ability XA:JOINOM skills, self-management skills and stress reduction, Increase healthy adjustment to current life circumstances.  Intervention: Reflective listening, Brief Counseling/Psychotherapy and Referral to Counselor/Psychotherapist   Issues discussed: family stressors, considering medication, current coping skills, and community resources      ASSESSMENT:Patient continues to  experience symptoms of anxiety and frustration with her husband.  Patient may benefit from long-term therapy, and is in agreement to receive  brief therapeutic interventions to assist with managing her symptoms until she is connected to ongoing services.   PLAN: 1. Patient will F/U with LCSW in 2 weeks 2. Behavioral recommendations: none at this time 3. Referral: list of providers for ongoing therpy 4. LCSW will F/U with phone call in 3 to 5 days  Casimer Lanius, LCSW Licensed Clinical Social Worker [place]   [phone] 3:11 PM

## 2016-12-22 NOTE — Progress Notes (Signed)
Patient had behavioral health appointment with LCSW today, (Dr. Yisroel Ramming was shadowing LCSW during the session ).  Patient discussed wanting anxiety medication but is unable to get off of work for appointment with Dr. Ardelia Mems during the day .    Dr. Yisroel Ramming scheduled patient to see him when he returns to clinic in 2 weeks.  LCSW called patient to provide the appointment day and time with Dr. Yisroel Ramming.    Casimer Lanius, LCSW Licensed Clinical Social Worker [facility]   [phone] 4:24 PM

## 2016-12-26 ENCOUNTER — Encounter: Payer: Self-pay | Admitting: Family Medicine

## 2016-12-28 ENCOUNTER — Ambulatory Visit: Payer: Self-pay | Admitting: Family Medicine

## 2016-12-30 ENCOUNTER — Ambulatory Visit: Payer: Self-pay | Admitting: Family Medicine

## 2017-01-02 NOTE — Progress Notes (Signed)
   Subjective:   Patient ID: Regina Sawyer    DOB: [birth year], 61 y.o. female   MRN: [id number]  CC: "Anxiety"  HPI: Regina Sawyer is a 61 y.o. female who presents to clinic today for anxiety. Problems discussed today are as follows:  Anxiety: Ongoing issue for over 1 year due to history of infidelity among partner. Symptoms include anxiety about being home around husband, frustration and feelings of "going off over small things." Patient copes by removing herself from partner. She is making efforts to restore the marriage through counseling. Her symptoms are absent when at work or away from partner. She also struggles with associated insomnia which she takes Remeron for with significant improvement. She denies feelings of depression. ROS: Denies shortness of breath, chest pain, palpitations, nausea or vomiting, sensation of impending doom, diaphoresis, SI/HI.  Complete ROS performed, see HPI for pertinent.  [place]: HTN, HLD, GERD, IDA, anxiety. Smoking status reviewed. Medications reviewed.  Objective:   BP 122/84   Pulse 79   Temp 98.6 F (37 C) (Oral)   Wt 158 lb (71.7 kg)   BMI 27.99 kg/m  Vitals and nursing note reviewed.  General: calm, non-tremulous, well nourished, well developed, in no acute distress with non-toxic appearance CV: regular rate and rhythm without murmurs, rubs, or gallops, no lower extremity edema Lungs: clear to auscultation bilaterally with normal work of breathing Abdomen: soft, non-tender, non-distended, normoactive bowel sounds Skin: warm, dry, no rashes or lesions, cap refill < 2 seconds Extremities: warm and well perfused, normal tone Psych: euthymic mood congruent affect  Assessment & Plan:   Anxiety disorder, unspecified Chronic. Clearly elicited by proximity to partner. Patient making efforts to restore marriage through counseling. More of an adjustment disorder with some anxious feeling though patient does not fit the diagnosis by length of  condition. GAD-7 score 3. PHQ-9 score 1. Both not at all difficult. --Medication does not seem appropriate for this case and patient is reluctant to start medication due to possible side effects --Patient agrees with continuing to meet with [facility] every 1-2 weeks until established at [facility] and will practice breathing techniques during episodes  No orders of the defined types were placed in this encounter.  No orders of the defined types were placed in this encounter.   Harriet Butte, [place], PGY-2 01/03/2017 8:36 PM

## 2017-01-03 ENCOUNTER — Encounter: Payer: Self-pay | Admitting: Licensed Clinical Social Worker

## 2017-01-03 ENCOUNTER — Encounter: Payer: Self-pay | Admitting: Family Medicine

## 2017-01-03 ENCOUNTER — Ambulatory Visit (INDEPENDENT_AMBULATORY_CARE_PROVIDER_SITE_OTHER): Payer: Medicare Other | Admitting: Family Medicine

## 2017-01-03 DIAGNOSIS — F418 Other specified anxiety disorders: Secondary | ICD-10-CM | POA: Diagnosis not present

## 2017-01-03 DIAGNOSIS — F419 Anxiety disorder, unspecified: Secondary | ICD-10-CM | POA: Insufficient documentation

## 2017-01-03 NOTE — Progress Notes (Signed)
  Total time:20 minutes Type of Service: Integrated Behavioral Health F/U Interpretor:No.    SUBJECTIVE: Regina Sawyer is a 61 y.o. female   Reason for follow-up: Continue brief interventions to address symptoms associated with anxiety and stress. Reports no impairment in daily functions at work. She only has symptoms at home. Patient presented pleasant less irritable and anxious than previous appointments when discussing her husband. Patient  Indicated her husband has agreed to also seek mental health support and has an upcome appointment.  She believes this is the start of healing the relationship.    LIFE CONTEXT:  Family & Social: lives with husband of 52 years  School/ Work: works PT at [employer] changes: relationship stressors with husband.   GOALS:  Patient will reduce symptoms of: anxiety and stress  And ability ZO:XWRUEA skills, self-management skills and stress reduction, Increase healthy adjustment to current life circumstances.  Intervention:  Psychologist, educational, psychoeducation and Motivational Interviewing  Issues discussed: family stressors, current coping skills, relaxed breathing, calming techniques   ASSESSMENT:Patient continues to  experience symptoms of anxiety and stress related to her husband.  Patient has shown some improvement with managing her symptoms,  She is in agreement to continue brief therapeutic interventions to assist with managing her symptoms until she starts going to [facility] on October 8th for long-term therapy  PLAN: 1. Patient will F/U with LCSW in 2 weeks 2. Behavioral recommendations: relaxed breathing and behavioral activation  3. Referral: [facility] [place], LCSW Licensed Clinical Social Worker [place]   [phone] 4:38 PM

## 2017-01-03 NOTE — Assessment & Plan Note (Addendum)
Chronic. Clearly elicited by proximity to partner. Patient making efforts to restore marriage through counseling. More of an adjustment disorder with some anxious feeling though patient does not fit the diagnosis by length of condition. GAD-7 score 3. PHQ-9 score 1. Both not at all difficult. --Medication does not seem appropriate for this case and patient is reluctant to start medication due to possible side effects --Patient agrees with continuing to meet with [facility] every 1-2 weeks until established at [facility] and will practice breathing techniques during episodes

## 2017-01-03 NOTE — Patient Instructions (Signed)
Thank you for coming in to see [place] today. Please see below to review our plan for today's visit.  1. I do not think he would benefit from medical therapy at this time. Please continue seeing Neoma Laming until we get you into the [facility]. 2. Neoma Laming will continue to meet with you every 1-2 weeks. I will continue using coping mechanisms during your anxious moments.   Please call the clinic at [phone] if your symptoms worsen or you have any concerns. It was my pleasure to see you. -- Harriet Butte, [place], PGY-2

## 2017-01-04 ENCOUNTER — Encounter: Payer: Self-pay | Admitting: Podiatry

## 2017-01-04 ENCOUNTER — Ambulatory Visit (INDEPENDENT_AMBULATORY_CARE_PROVIDER_SITE_OTHER): Payer: 59 | Admitting: Podiatry

## 2017-01-04 DIAGNOSIS — M722 Plantar fascial fibromatosis: Secondary | ICD-10-CM | POA: Diagnosis not present

## 2017-01-04 MED ORDER — BETAMETHASONE SOD PHOS & ACET 6 (3-3) MG/ML IJ SUSP: 3.0000 mg | Freq: Once | INTRAMUSCULAR | Status: DC

## 2017-01-05 ENCOUNTER — Ambulatory Visit: Payer: Self-pay

## 2017-01-08 NOTE — Progress Notes (Signed)
   Subjective: Patient presents today for follow-up treatment and evaluation of bilateral plantar fasciitis. She reports continued pain in bilateral heels and rates it 9/10. She states she has lost her fascial braces. She has not been taking Tramadol due to adverse side effects. She is here for further evaluation and treatment.    Past Medical History:  Diagnosis Date  . Asthma   . Atrophic vaginitis 2009  . BV (bacterial vaginosis) 2005  . Candida vaginitis 2006  . Candida vaginitis 2009  . Depression   . Dysuria 2007  . Female pelvic pain 2006  . Fibroid 2005  . GERD (gastroesophageal reflux disease)   . H/O constipation 2009  . H/O dyspareunia 2008  . H/O urinary frequency 2011  . H/O vaginitis 2005  . Heart murmur   . History of bacterial infection   . History of hemorrhoids 2009  . Hx: UTI (urinary tract infection) 2007  . Hyperlipidemia   . Hypertension   . Irregular periods/menstrual cycles 2007  . Libido, decreased 2006  . Menopausal symptoms 2007  . Nocturia 2009  . Proteinuria 2009  . Sickle cell trait ([place])   . Tenderness of female pelvic organs 2005  . Ulcer   . Vaginal irritation 2011     Objective: Physical Exam General: The patient is alert and oriented x3 in no acute distress.  Dermatology: Skin is warm, dry and supple bilateral lower extremities. Negative for open lesions or macerations bilateral.   Vascular: Dorsalis Pedis and Posterior Tibial pulses palpable bilateral.  Capillary fill time is immediate to all digits.  Neurological: Epicritic and protective threshold intact bilateral.   Musculoskeletal: Tenderness to palpation at the medial calcaneal tubercale and through the insertion of the plantar fascia of the bilateral feet. All other joints range of motion within normal limits bilateral. Strength 5/5 in all groups bilateral.   Assessment: 1. plantar fasciitis bilateral feet  Plan of Care:  1. Patient evaluated.   2. Injection of 0.5 mLs  Celestone Soluspan injected into the bilateral heels.  3. Continue wearing night splints. 4. Continue OTC Tylenol when necessary. 5. Patient cannot tolerate oral NSAIDs. 6. Return to clinic in 4 weeks. If not better, EPF bilaterally.   Edrick Kins, DPM [facility]  Dr. Edrick Kins, DPM    [address], [place] [address]                Office [phone]  Fax [phone]

## 2017-01-11 ENCOUNTER — Other Ambulatory Visit: Payer: Self-pay | Admitting: Family Medicine

## 2017-01-19 ENCOUNTER — Ambulatory Visit: Payer: Self-pay

## 2017-01-25 ENCOUNTER — Ambulatory Visit: Payer: Self-pay

## 2017-01-26 ENCOUNTER — Telehealth: Payer: Self-pay | Admitting: Licensed Clinical Social Worker

## 2017-01-26 NOTE — Progress Notes (Addendum)
Integrated Care f/u phone call to patient. Patient was no show for [facility] appointment yesterday.  States she tried to call but was unable to get through.  Patient reports she went to her intake appointment at the [facility] on October 8th.  Her 2nd appointment at the [facility] is scheduled Oct 17th.    Intervention: , Reflective listening. Plan: Patient will f/u with LCSW if needed.  Casimer Lanius, LCSW Licensed Clinical Social Worker [place]   [phone] 9:56 AM

## 2017-02-01 ENCOUNTER — Ambulatory Visit: Payer: 59 | Admitting: Podiatry

## 2017-02-05 ENCOUNTER — Other Ambulatory Visit: Payer: Self-pay | Admitting: Family Medicine

## 2017-02-06 ENCOUNTER — Encounter: Payer: Self-pay | Admitting: Podiatry

## 2017-02-06 ENCOUNTER — Ambulatory Visit (INDEPENDENT_AMBULATORY_CARE_PROVIDER_SITE_OTHER): Payer: Medicare Other | Admitting: Podiatry

## 2017-02-06 DIAGNOSIS — M722 Plantar fascial fibromatosis: Secondary | ICD-10-CM

## 2017-02-08 ENCOUNTER — Ambulatory Visit: Payer: Medicare Other | Admitting: Orthotics

## 2017-02-08 DIAGNOSIS — M722 Plantar fascial fibromatosis: Secondary | ICD-10-CM

## 2017-02-08 NOTE — Progress Notes (Signed)
Patient declined casting/F/O once she discovered [employer] would not pay.

## 2017-02-08 NOTE — Progress Notes (Signed)
   Subjective: Patient presents today for follow-up treatment and evaluation of bilateral plantar fasciitis. She states her pain has not changed since her last visit. She states the injections did not provide any relief and declines them today. She is here for further evaluation and treatment.    Past Medical History:  Diagnosis Date  . Asthma   . Atrophic vaginitis 2009  . BV (bacterial vaginosis) 2005  . Candida vaginitis 2006  . Candida vaginitis 2009  . Depression   . Dysuria 2007  . Female pelvic pain 2006  . Fibroid 2005  . GERD (gastroesophageal reflux disease)   . H/O constipation 2009  . H/O dyspareunia 2008  . H/O urinary frequency 2011  . H/O vaginitis 2005  . Heart murmur   . History of bacterial infection   . History of hemorrhoids 2009  . Hx: UTI (urinary tract infection) 2007  . Hyperlipidemia   . Hypertension   . Irregular periods/menstrual cycles 2007  . Libido, decreased 2006  . Menopausal symptoms 2007  . Nocturia 2009  . Proteinuria 2009  . Sickle cell trait ([place])   . Tenderness of female pelvic organs 2005  . Ulcer   . Vaginal irritation 2011     Objective: Physical Exam General: The patient is alert and oriented x3 in no acute distress.  Dermatology: Skin is warm, dry and supple bilateral lower extremities. Negative for open lesions or macerations bilateral.   Vascular: Dorsalis Pedis and Posterior Tibial pulses palpable bilateral.  Capillary fill time is immediate to all digits.  Neurological: Epicritic and protective threshold intact bilateral.   Musculoskeletal: Tenderness to palpation at the medial calcaneal tubercale and through the insertion of the plantar fascia of the bilateral feet. All other joints range of motion within normal limits bilateral. Strength 5/5 in all groups bilateral.   Assessment: 1. plantar fasciitis bilateral feet  Plan of Care:  1. Patient evaluated.   2. Appt with Liliane Channel for custom molded orthotics. 3. Pain  cream to be dispensed from [facility]. 4. Cannot tolerate oral NSAIDs.  5. Return to clinic in 8 weeks. If not better, will discuss surgery.    Edrick Kins, DPM [facility]  Dr. Edrick Kins, DPM    [address], [place] [address]                Office [phone]  Fax [phone]

## 2017-02-13 MED ORDER — NONFORMULARY OR COMPOUNDED ITEM: 2 refills | Status: DC

## 2017-02-13 NOTE — Addendum Note (Signed)
Addended by: Tomi Bamberger on: 02/13/2017 02:45 PM   Modules accepted: Orders

## 2017-02-20 ENCOUNTER — Other Ambulatory Visit: Payer: Self-pay | Admitting: Family Medicine

## 2017-02-20 MED ORDER — CARVEDILOL 6.25 MG PO TABS: 6.2500 mg | ORAL_TABLET | Freq: Two times a day (BID) | ORAL | 1 refills | Status: DC

## 2017-02-20 NOTE — Telephone Encounter (Signed)
Cardedilol (?) bp med needs to be refilled to [facility] [address].

## 2017-03-03 ENCOUNTER — Telehealth: Payer: Self-pay | Admitting: Gastroenterology

## 2017-03-03 ENCOUNTER — Other Ambulatory Visit: Payer: Self-pay

## 2017-03-03 MED ORDER — DEXLANSOPRAZOLE 60 MG PO CPDR: 1.0000 | DELAYED_RELEASE_CAPSULE | Freq: Every day | ORAL | 1 refills | Status: DC

## 2017-03-03 NOTE — Telephone Encounter (Signed)
Prescription sent. 04-2017 appt must be keep for future refills

## 2017-03-03 NOTE — Telephone Encounter (Signed)
Sent script to pharmacy. Called pt and left message letting her know script was sent for enough medication to last until 04/2017 appt and that appt must be kept for any further refills.

## 2017-03-28 ENCOUNTER — Other Ambulatory Visit: Payer: Self-pay | Admitting: Family Medicine

## 2017-04-03 ENCOUNTER — Ambulatory Visit: Payer: Medicare Other | Admitting: Podiatry

## 2017-04-07 ENCOUNTER — Other Ambulatory Visit: Payer: Self-pay

## 2017-04-12 ENCOUNTER — Other Ambulatory Visit: Payer: Self-pay

## 2017-04-12 ENCOUNTER — Other Ambulatory Visit: Payer: Medicaid Other

## 2017-04-20 ENCOUNTER — Other Ambulatory Visit: Payer: Self-pay | Admitting: Family Medicine

## 2017-04-20 NOTE — Telephone Encounter (Signed)
Needs refills lisinopril And mirtazapine.  [facility]

## 2017-04-21 MED ORDER — LISINOPRIL 20 MG PO TABS: 20.0000 mg | ORAL_TABLET | Freq: Every day | ORAL | 1 refills | Status: DC

## 2017-04-21 MED ORDER — MIRTAZAPINE 15 MG PO TABS: 15.0000 mg | ORAL_TABLET | Freq: Every day | ORAL | 1 refills | Status: DC

## 2017-04-24 ENCOUNTER — Ambulatory Visit: Payer: Medicare Other | Admitting: Podiatry

## 2017-04-25 ENCOUNTER — Other Ambulatory Visit: Payer: Self-pay | Admitting: Gastroenterology

## 2017-04-27 ENCOUNTER — Encounter: Payer: Self-pay | Admitting: Gastroenterology

## 2017-04-27 ENCOUNTER — Ambulatory Visit (INDEPENDENT_AMBULATORY_CARE_PROVIDER_SITE_OTHER): Payer: 59 | Admitting: Gastroenterology

## 2017-04-27 VITALS — BP 144/76 | HR 84 | Ht 62.0 in | Wt 158.0 lb

## 2017-04-27 DIAGNOSIS — K219 Gastro-esophageal reflux disease without esophagitis: Secondary | ICD-10-CM | POA: Diagnosis not present

## 2017-04-27 MED ORDER — DEXLANSOPRAZOLE 30 MG PO CPDR: 30.0000 mg | DELAYED_RELEASE_CAPSULE | Freq: Every day | ORAL | 1 refills | Status: DC

## 2017-04-27 NOTE — Progress Notes (Signed)
HPI :  62 year old female here for a follow-up visit for reflux. She has seen me previously for chronic gas and bloating. She's had prior workup as outlined below. Since her last visit she had celiac serologies were negative tried empiric rifaximin for bloating.  Generally she states she is doing really well at this time. She has been taking Dexilant at 60 mg per day for reflux. She states she has been on other PPIs in the past which have not helped as much as Dexilant. She denies any abdominal pains. She denies any heartburn. She denies any dysphagia. She denies any weight loss. She denies any changes in her bowel. No blood in her stools. She is using Gas-X as needed for bloating which she states helps.  Prior workup EGD 07/2014 - benign gastric polyps, otherwise normal Colonoscopy - 10/2014 - 2 small adenomas, recall in 5 years CT 07/2014 - was unremarkable except for uterine fibroids  Gastric emptying scan 5/16 - normal     Past Medical History:  Diagnosis Date  . Asthma   . Atrophic vaginitis 2009  . BV (bacterial vaginosis) 2005  . Candida vaginitis 2006  . Candida vaginitis 2009  . Depression   . Dysuria 2007  . Female pelvic pain 2006  . Fibroid 2005  . GERD (gastroesophageal reflux disease)   . H/O constipation 2009  . H/O dyspareunia 2008  . H/O urinary frequency 2011  . H/O vaginitis 2005  . Heart murmur   . History of bacterial infection   . History of hemorrhoids 2009  . Hx: UTI (urinary tract infection) 2007  . Hyperlipidemia   . Hypertension   . Irregular periods/menstrual cycles 2007  . Libido, decreased 2006  . Menopausal symptoms 2007  . Nocturia 2009  . Proteinuria 2009  . Sickle cell trait ([place])   . Tenderness of female pelvic organs 2005  . Ulcer   . Vaginal irritation 2011     Past Surgical History:  Procedure Laterality Date  . CARPAL TUNNEL RELEASE     both wrists  . COLONOSCOPY N/A 10/28/2014   Procedure: COLONOSCOPY;  Surgeon: Inda Castle, MD;  Location: [facility] ENDOSCOPY;  Service: Endoscopy;  Laterality: N/A;  . ESOPHAGOGASTRODUODENOSCOPY N/A 08/06/2014   Procedure: ESOPHAGOGASTRODUODENOSCOPY (EGD);  Surgeon: Inda Castle, MD;  Location: [facility];  Service: Endoscopy;  Laterality: N/A;  . TUBAL LIGATION     Family History  Problem Relation Age of Onset  . Diabetes Other   . Diabetes Sister   . Hypertension Sister   . Colon cancer Father   . Heart failure Father   . Heart failure Brother        x2  . Gallstones Mother   . Colon polyps Neg Hx   . Kidney disease Neg Hx   . Esophageal cancer Neg Hx   . Gallbladder disease Neg Hx    Social History   Tobacco Use  . Smoking status: Passive Smoke Exposure - Never Smoker  . Smokeless tobacco: Never Used  Substance Use Topics  . Alcohol use: No  . Drug use: No   Current Outpatient Medications  Medication Sig Dispense Refill  . carvedilol (COREG) 6.25 MG tablet Take 1 tablet (6.25 mg total) 2 (two) times daily with a meal by mouth. 180 tablet 1  . dexlansoprazole (DEXILANT) 60 MG capsule Take 1 capsule (60 mg total) daily by mouth. 30 capsule 1  . escitalopram (LEXAPRO) 10 MG tablet Take 10 mg by mouth daily.    Marland Kitchen  lisinopril (PRINIVIL,ZESTRIL) 20 MG tablet Take 1 tablet (20 mg total) by mouth daily. 30 tablet 1  . mirtazapine (REMERON) 15 MG tablet Take 1 tablet (15 mg total) by mouth at bedtime. 30 tablet 1  . Multiple Vitamins-Minerals (MULTIVITAMIN WITH MINERALS) tablet Take 1 tablet by mouth daily.     Current Facility-Administered Medications  Medication Dose Route Frequency Provider Last Rate Last Dose  . betamethasone acetate-betamethasone sodium phosphate (CELESTONE) injection 3 mg  3 mg Intramuscular Once Edrick Kins, DPM       Allergies  Allergen Reactions  . Atorvastatin Other (See Comments)    Abdominal pain, severe nausea, muscle / body aches  . Lactose   . Latex Hives, Itching and Rash     Review of Systems: All systems reviewed and  negative except where noted in HPI.   Lab Results  Component Value Date   WBC 4.0 07/23/2014   HGB 11.9 (L) 04/19/2016   HCT 35.0 (L) 04/19/2016   MCV 85.4 07/23/2014   PLT 380 07/23/2014    Lab Results  Component Value Date   CREATININE 1.09 (H) 07/19/2016   BUN 13 07/19/2016   NA 142 07/19/2016   K 4.9 07/19/2016   CL 102 07/19/2016   CO2 26 07/19/2016    Lab Results  Component Value Date   ALT 10 07/19/2016   AST 15 07/19/2016   ALKPHOS 112 07/19/2016   BILITOT <0.2 07/19/2016     Physical Exam: BP (!) 144/76   Pulse 84   Ht 5\' 2"  (1.575 m)   Wt 158 lb (71.7 kg)   BMI 28.90 kg/m  Constitutional: Pleasant,w female in no acute distress. HEENT: Normocephalic and atraumatic. Conjunctivae are normal. No scleral icterus. Neck supple.  Cardiovascular: Normal rate, regular rhythm.  Pulmonary/chest: Effort normal and breath sounds normal. No wheezing, rales or rhonchi. Abdominal: Soft, nondistended, nontender.  There are no masses palpable. No hepatomegaly. Extremities: no edema Lymphadenopathy: No cervical adenopathy noted. Neurological: Alert and oriented to person place and time. Skin: Skin is warm and dry. No rashes noted. Psychiatric: Normal mood and affect. Behavior is normal.   ASSESSMENT AND PLAN: 62 year old female here for reassessment of her reflux:  Generally doing really well since last time I have seen her, reflux and her bloating symptoms appear well controlled on Dexilant. She has no history of Barrett's esophagus. I discussed the long term risks associated with PPI use. Long term recommend lowest daily dose of PPI needed to control her symptoms, or use of an alternative such as Zantac. She feels Dexilant has worked better than anything else. I do think it is worth trying a lower dose of Dexilant initially to see if she tolerates it, and if so, can try weaning off further over time. She agreed. Will change Dexilant to 30mg  once daily. If she doesn't  tolerate lowering the dose, she can increase back to 60mg , she understands risks of PPIs after our discussion today, but they do appear to significantly improve her quality of life. Otherwise, recall colonoscopy 2021 for history of adenomas.   [place] Cellar, MD [facility] Gastroenterology Pager [phone]

## 2017-04-27 NOTE — Patient Instructions (Addendum)
If you are age 60 or older, your body mass index should be between 23-30. Your Body mass index is 28.9 kg/m. If this is out of the aforementioned range listed, please consider follow up with your Primary Care Provider.  If you are age 40 or younger, your body mass index should be between 19-25. Your Body mass index is 28.9 kg/m. If this is out of the aformentioned range listed, please consider follow up with your Primary Care Provider.   We have sent the following medications to your pharmacy for you to pick up at your convenience: Dexilant 30mg   Please let us know if you cannot tolerate this change and we will go back to 60 mg.   Thank you for entrusting me with your care and for [facility], Dr. [place] Cellar

## 2017-05-05 ENCOUNTER — Ambulatory Visit: Payer: Self-pay | Admitting: Family Medicine

## 2017-05-22 ENCOUNTER — Ambulatory Visit: Payer: 59 | Admitting: Podiatry

## 2017-05-24 ENCOUNTER — Ambulatory Visit: Payer: 59 | Admitting: Podiatry

## 2017-06-05 ENCOUNTER — Ambulatory Visit (INDEPENDENT_AMBULATORY_CARE_PROVIDER_SITE_OTHER): Payer: 59 | Admitting: Podiatry

## 2017-06-05 DIAGNOSIS — M722 Plantar fascial fibromatosis: Secondary | ICD-10-CM

## 2017-06-05 MED ORDER — TRAMADOL HCL 50 MG PO TABS: 50.0000 mg | ORAL_TABLET | Freq: Three times a day (TID) | ORAL | 0 refills | Status: DC | PRN

## 2017-06-07 NOTE — Progress Notes (Signed)
   Subjective: Patient presents today for follow-up treatment and evaluation of bilateral plantar fasciitis. She states the pain is relatively unchanged. She reports some improvement on some days but most of the time it is the same. Patient is here for further evaluation and treatment.   Past Medical History:  Diagnosis Date  . Asthma   . Atrophic vaginitis 2009  . BV (bacterial vaginosis) 2005  . Candida vaginitis 2006  . Candida vaginitis 2009  . Depression   . Dysuria 2007  . Female pelvic pain 2006  . Fibroid 2005  . GERD (gastroesophageal reflux disease)   . H/O constipation 2009  . H/O dyspareunia 2008  . H/O urinary frequency 2011  . H/O vaginitis 2005  . Heart murmur   . History of bacterial infection   . History of hemorrhoids 2009  . Hx: UTI (urinary tract infection) 2007  . Hyperlipidemia   . Hypertension   . Irregular periods/menstrual cycles 2007  . Libido, decreased 2006  . Menopausal symptoms 2007  . Nocturia 2009  . Proteinuria 2009  . Sickle cell trait ([place])   . Tenderness of female pelvic organs 2005  . Ulcer   . Vaginal irritation 2011     Objective: Physical Exam General: The patient is alert and oriented x3 in no acute distress.  Dermatology: Skin is warm, dry and supple bilateral lower extremities. Negative for open lesions or macerations bilateral.   Vascular: Dorsalis Pedis and Posterior Tibial pulses palpable bilateral.  Capillary fill time is immediate to all digits.  Neurological: Epicritic and protective threshold intact bilateral.   Musculoskeletal: Tenderness to palpation at the medial calcaneal tubercale and through the insertion of the plantar fascia of the bilateral feet. All other joints range of motion within normal limits bilateral. Strength 5/5 in all groups bilateral.   Assessment: 1. plantar fasciitis bilateral feet  Plan of Care:  1. Patient evaluated.   2. Patient could not afford custom molded orthotics.  3. Cannot  tolerate oral NSAIDs.  4. Discussed possible surgery in the future. Patient unable to take time off work at the moment.  5. Prescription for tramadol 50 mg #60 provided to patient.  6. Return to clinic as needed.    Edrick Kins, DPM [facility]  Dr. Edrick Kins, DPM    [address], [place] [address]                Office [phone]  Fax [phone]

## 2017-06-08 DIAGNOSIS — H47233 Glaucomatous optic atrophy, bilateral: Secondary | ICD-10-CM | POA: Diagnosis not present

## 2017-06-08 DIAGNOSIS — H5501 Congenital nystagmus: Secondary | ICD-10-CM | POA: Diagnosis not present

## 2017-07-10 ENCOUNTER — Other Ambulatory Visit: Payer: Self-pay

## 2017-07-11 ENCOUNTER — Other Ambulatory Visit: Payer: Self-pay | Admitting: Family Medicine

## 2017-07-12 ENCOUNTER — Other Ambulatory Visit: Payer: Self-pay | Admitting: Family Medicine

## 2017-07-13 MED ORDER — MIRTAZAPINE 15 MG PO TABS: 15.0000 mg | ORAL_TABLET | Freq: Every day | ORAL | 1 refills | Status: DC

## 2017-07-13 NOTE — Telephone Encounter (Signed)
Please ask patient to schedule follow up with me, thanks! Francisco Eyerly J Lillias Difrancesco, MD  

## 2017-07-14 NOTE — Telephone Encounter (Signed)
Appt made for 08/07/17 @ 1:50 Tavares Levinson, [name], CMA

## 2017-08-05 ENCOUNTER — Ambulatory Visit (HOSPITAL_COMMUNITY)
Admission: EM | Admit: 2017-08-05 | Discharge: 2017-08-05 | Disposition: A | Discharge status: Home / Self Care | Payer: 59 | Attending: Family Medicine | Admitting: Family Medicine

## 2017-08-05 ENCOUNTER — Other Ambulatory Visit: Payer: Self-pay

## 2017-08-05 ENCOUNTER — Encounter (HOSPITAL_COMMUNITY): Payer: Self-pay | Admitting: *Deleted

## 2017-08-05 DIAGNOSIS — J069 Acute upper respiratory infection, unspecified: Secondary | ICD-10-CM | POA: Diagnosis not present

## 2017-08-05 DIAGNOSIS — B9789 Other viral agents as the cause of diseases classified elsewhere: Secondary | ICD-10-CM | POA: Diagnosis not present

## 2017-08-05 MED ORDER — ALBUTEROL SULFATE HFA 108 (90 BASE) MCG/ACT IN AERS: 1.0000 | INHALATION_SPRAY | Freq: Four times a day (QID) | RESPIRATORY_TRACT | 0 refills | Status: DC | PRN

## 2017-08-05 MED ORDER — HYDROCODONE-HOMATROPINE 5-1.5 MG/5ML PO SYRP: 5.0000 mL | ORAL_SOLUTION | Freq: Four times a day (QID) | ORAL | 0 refills | Status: DC | PRN

## 2017-08-05 MED ORDER — BENZONATATE 100 MG PO CAPS: 100.0000 mg | ORAL_CAPSULE | Freq: Three times a day (TID) | ORAL | 0 refills | Status: DC | PRN

## 2017-08-05 NOTE — Discharge Instructions (Signed)
Continue to push fluids, practice good hand hygiene, and cover your mouth if you cough.  If you start having fevers, shaking or shortness of breath, seek immediate care.  Do not drink alcohol, do any illicit/street drugs, drive or do anything that requires alertness while on this cough syrup.   For symptoms, consider using Vick's VapoRub on chest or under nose, air humidifier, Benadryl at night, and elevating the head of the bed. Tylenol and ibuprofen for aches and pains you may be experiencing.

## 2017-08-05 NOTE — ED Triage Notes (Signed)
C/O productive cough, congestion, runny nose, body aches, bilat earache since yesterday.  Denies any fevers.  Has been taking Robitussin DM.

## 2017-08-05 NOTE — ED Provider Notes (Signed)
  [place]    CSN: [id number] Arrival date & time: 08/05/17  1200  Chief Complaint  Patient presents with  . Cough  . Generalized Body Aches    Regina Sawyer here for URI complaints.  Duration: 1 day  Associated symptoms: rhinorrhea, ear pain, sore throat, wheezing and a dry cough Denies: sinus congestion, sinus pain, itchy watery eyes, ear drainage, shortness of breath, myalgia and fevers Treatment to date: None Sick contacts: Yes - contacts w similar s/s's at work  ROS:  Const: Denies fevers HEENT: As noted in HPI Lungs: No SOB  Past Medical History:  Diagnosis Date  . Asthma   . Atrophic vaginitis 2009  . BV (bacterial vaginosis) 2005  . Candida vaginitis 2006  . Candida vaginitis 2009  . Depression   . Dysuria 2007  . Female pelvic pain 2006  . Fibroid 2005  . GERD (gastroesophageal reflux disease)   . H/O constipation 2009  . H/O dyspareunia 2008  . H/O urinary frequency 2011  . H/O vaginitis 2005  . Heart murmur   . History of bacterial infection   . History of hemorrhoids 2009  . Hx: UTI (urinary tract infection) 2007  . Hyperlipidemia   . Hypertension   . Irregular periods/menstrual cycles 2007  . Libido, decreased 2006  . Menopausal symptoms 2007  . Nocturia 2009  . Proteinuria 2009  . Sickle cell trait ([place])   . Tenderness of female pelvic organs 2005  . Ulcer   . Vaginal irritation 2011   Family History  Problem Relation Age of Onset  . Diabetes Other   . Diabetes Sister   . Hypertension Sister   . Colon cancer Father   . Heart failure Father   . Heart failure Brother        x2  . Gallstones Mother   . Colon polyps Neg Hx   . Kidney disease Neg Hx   . Esophageal cancer Neg Hx   . Gallbladder disease Neg Hx     BP 135/88   Pulse (!) 109   Temp 99.9 F (37.7 C) (Oral)   Resp 20   SpO2 96%  General: Awake, alert, appears stated age HEENT: AT, [place], ears patent b/l and TM's neg, nares patent w/o discharge, pharynx pink  and without exudates, MMM, edentulous Neck: No masses or asymmetry Heart: RRR (rate around 72 when I auscultated) Lungs: CTAB, no accessory muscle use Psych: Age appropriate judgment and insight, normal mood and affect  Viral URI with cough  Albuterol as this has helped her in past, Hycodan for nighttime and Tessalon Perles during day.  Continue to push fluids, practice good hand hygiene, cover mouth when coughing. F/u prn. If starting to experience fevers, shaking, or shortness of breath, seek immediate care. Pt voiced understanding and agreement to the plan.    Shelda Pal, DO 08/05/17 1230

## 2017-08-07 ENCOUNTER — Ambulatory Visit (INDEPENDENT_AMBULATORY_CARE_PROVIDER_SITE_OTHER): Payer: 59 | Admitting: Family Medicine

## 2017-08-07 ENCOUNTER — Encounter: Payer: Self-pay | Admitting: Family Medicine

## 2017-08-07 ENCOUNTER — Other Ambulatory Visit: Payer: Self-pay

## 2017-08-07 VITALS — BP 128/82 | HR 79 | Temp 98.6°F | Ht 62.0 in | Wt 158.6 lb

## 2017-08-07 DIAGNOSIS — E785 Hyperlipidemia, unspecified: Secondary | ICD-10-CM | POA: Diagnosis not present

## 2017-08-07 DIAGNOSIS — Z7689 Persons encountering health services in other specified circumstances: Secondary | ICD-10-CM | POA: Diagnosis not present

## 2017-08-07 DIAGNOSIS — I10 Essential (primary) hypertension: Secondary | ICD-10-CM | POA: Diagnosis not present

## 2017-08-07 DIAGNOSIS — K219 Gastro-esophageal reflux disease without esophagitis: Secondary | ICD-10-CM | POA: Diagnosis not present

## 2017-08-07 MED ORDER — LISINOPRIL 20 MG PO TABS: 20.0000 mg | ORAL_TABLET | Freq: Every day | ORAL | 1 refills | Status: DC

## 2017-08-07 MED ORDER — CARVEDILOL 6.25 MG PO TABS: 6.2500 mg | ORAL_TABLET | Freq: Two times a day (BID) | ORAL | 1 refills | Status: DC

## 2017-08-07 MED ORDER — MIRTAZAPINE 15 MG PO TABS: ORAL_TABLET | ORAL | 1 refills | Status: DC

## 2017-08-07 NOTE — Progress Notes (Signed)
Date of Visit: 08/07/2017   HPI:  Patient presents for routine follow up.  Hypertension - currently taking carvedilol 6.25mg  twice daily, lisinopril 20mg  daily. Tolerating these well. No chest pain or shortness of breath. No swelling.  GERD - taking dexilant 30mg  daily, works well for her.  Sleep - taking remeron 15mg  at bedtime which helps with sleep. Weight stable from last visit.  ROS: See HPI.  [place]: history of hypertension, hyperlipidemia, GERD, preDM  PHYSICAL EXAM: BP 128/82   Pulse 79   Temp 98.6 F (37 C) (Oral)   Ht 5\' 2"  (1.575 m)   Wt 158 lb 9.6 oz (71.9 kg)   SpO2 99%   BMI 29.01 kg/m  Gen: no acute distress, pleasant, cooperative HEENT: normocephalic, atraumatic, moist mucous membranes  Heart: regular rate and rhythm, no murmur Lungs: clear to auscultation bilaterally, normal work of breathing  Neuro: alert, grossly nonfocal, speech normal. Ext: No appreciable lower extremity edema bilaterally   ASSESSMENT/PLAN:  Health maintenance:  -current on HM items  HYPERTENSION, BENIGN ESSENTIAL Well controlled. Continue current regimen. Check fasting labs at appointment this week - CMET, lipids  Hyperlipidemia Check lipids at fasting lab visit, previously did not tolerate statin  GASTROESOPHAGEAL REFLUX, NO ESOPHAGITIS Well controlled on dexilant  Sleep concern Well controlled with remeron, continue this medication.   FOLLOW UP: Follow up in 3 mos for routine medical issues, sooner if needed  [place] J. Ardelia Mems, [place]

## 2017-08-07 NOTE — Patient Instructions (Addendum)
Sent in refill on medications. Blood pressure looks good  Follow up with me in 3 months, sooner if needed  On your way out, schedule an appointment one morning to come back for fasting labs. Do not eat or drink anything other than water the morning of your lab appointment until after your labs are drawn.  Be well, Dr. Ardelia Mems

## 2017-08-08 ENCOUNTER — Other Ambulatory Visit: Payer: 59

## 2017-08-08 DIAGNOSIS — E785 Hyperlipidemia, unspecified: Secondary | ICD-10-CM

## 2017-08-08 NOTE — Progress Notes (Signed)
LABS

## 2017-08-09 LAB — LIPID PANEL
Chol/HDL Ratio: 4.8 ratio — ABNORMAL HIGH (ref 0.0–4.4)
Cholesterol, Total: 210 mg/dL — ABNORMAL HIGH (ref 100–199)
HDL: 44 mg/dL (ref >39)
LDL Calculated: 138 mg/dL — ABNORMAL HIGH (ref 0–99)
TRIGLYCERIDES: 138 mg/dL (ref 0–149)
VLDL Cholesterol Cal: 28 mg/dL (ref 5–40)

## 2017-08-09 LAB — CMP14+EGFR
A/G RATIO: 1.1 — AB (ref 1.2–2.2)
ALT: 13 IU/L (ref 0–32)
AST: 17 IU/L (ref 0–40)
Albumin: 3.8 g/dL (ref 3.5–5.5)
Alkaline Phosphatase: 91 IU/L (ref 39–117)
BILIRUBIN TOTAL: 0.2 mg/dL (ref 0.0–1.2)
BUN / CREAT RATIO: 10 (ref 9–23)
BUN: 12 mg/dL (ref 6–24)
CHLORIDE: 103 mmol/L (ref 96–106)
CO2: 26 mmol/L (ref 20–29)
Calcium: 9.7 mg/dL (ref 8.7–10.2)
Creatinine, Ser: 1.25 mg/dL — ABNORMAL HIGH (ref 0.57–1.00)
GFR calc non Af Amer: 47 mL/min/{1.73_m2} — ABNORMAL LOW (ref >59)
GFR, EST AFRICAN AMERICAN: 54 mL/min/{1.73_m2} — AB (ref >59)
Globulin, Total: 3.5 g/dL (ref 1.5–4.5)
Glucose: 98 mg/dL (ref 65–99)
POTASSIUM: 4.8 mmol/L (ref 3.5–5.2)
Sodium: 142 mmol/L (ref 134–144)
TOTAL PROTEIN: 7.3 g/dL (ref 6.0–8.5)

## 2017-08-10 ENCOUNTER — Telehealth: Payer: Self-pay | Admitting: Family Medicine

## 2017-08-10 MED ORDER — ROSUVASTATIN CALCIUM 20 MG PO TABS: 20.0000 mg | ORAL_TABLET | Freq: Every day | ORAL | 1 refills | Status: DC

## 2017-08-10 NOTE — Assessment & Plan Note (Signed)
Well controlled. Continue current regimen. Check fasting labs at appointment this week - CMET, lipids

## 2017-08-10 NOTE — Telephone Encounter (Signed)
Pt called and said she would like to speak with Dr Kandyce Rud. I saw she had already spoke with her today and I asked if she had any questions about that phone call and she said no, that she now needs to speak with her about something else.

## 2017-08-10 NOTE — Assessment & Plan Note (Signed)
Check lipids at fasting lab visit, previously did not tolerate statin

## 2017-08-10 NOTE — Assessment & Plan Note (Signed)
Well controlled with remeron, continue this medication.

## 2017-08-10 NOTE — Telephone Encounter (Signed)
Attempted to reach patient to discuss results. No answer. LVm asking her to call back.  Plan to recommend statin if she wants to try one again. Also recommend recheck of creatinine in 3 months as kidney function was slightly decreased from last time.  I will be out of the office the next 2 weeks but Drs. Mingo Amber and [place] are covering for me if patient returns call.  Leeanne Rio, MD

## 2017-08-10 NOTE — Assessment & Plan Note (Signed)
Well controlled on dexilant

## 2017-08-10 NOTE — Telephone Encounter (Signed)
Called another # and reached patient. Recommend trial of crestor to see if she can tolerate it. She is agreeable. Will send in to pharmacy. Advised of need to recheck bmet in 3 months. Patient apprecitive Leeanne Rio, MD

## 2017-08-11 NOTE — Telephone Encounter (Signed)
Call pt back, no answer and unidentifiable VM.  Left message asking for a callback.  Please let her know of the message from Dr. Erin Hearing if she calls back. [place], [name], CMA

## 2017-08-11 NOTE — Telephone Encounter (Signed)
Pls let her know Dr Ardelia Mems is away for 2 weeks.  She can contact her then or if she has an urgent question send back to me  Thanks  [place]

## 2017-08-27 ENCOUNTER — Other Ambulatory Visit: Payer: Self-pay | Admitting: Family Medicine

## 2017-09-01 ENCOUNTER — Telehealth: Payer: Self-pay | Admitting: Cardiology

## 2017-09-01 NOTE — Telephone Encounter (Signed)
Returned call to patient. Explained that per last MD note from 12/2015 no murmur was noted. No further assistance needed

## 2017-09-01 NOTE — Telephone Encounter (Signed)
New Message   Patient is calling to see whether or not a heart murmur was discovered at the time of her appointment. Please call.

## 2017-09-07 ENCOUNTER — Other Ambulatory Visit: Payer: Self-pay | Admitting: *Deleted

## 2017-09-07 MED ORDER — ALBUTEROL SULFATE HFA 108 (90 BASE) MCG/ACT IN AERS: 1.0000 | INHALATION_SPRAY | Freq: Four times a day (QID) | RESPIRATORY_TRACT | 0 refills | Status: DC | PRN

## 2017-09-27 ENCOUNTER — Ambulatory Visit: Payer: 59 | Admitting: Podiatry

## 2017-10-08 ENCOUNTER — Other Ambulatory Visit: Payer: Self-pay | Admitting: Gastroenterology

## 2017-10-09 ENCOUNTER — Ambulatory Visit: Payer: 59 | Admitting: Podiatry

## 2017-11-15 ENCOUNTER — Telehealth: Payer: Self-pay | Admitting: Family Medicine

## 2017-11-15 DIAGNOSIS — IMO0002 Reserved for concepts with insufficient information to code with codable children: Secondary | ICD-10-CM

## 2017-11-15 DIAGNOSIS — R29898 Other symptoms and signs involving the musculoskeletal system: Principal | ICD-10-CM

## 2017-11-15 NOTE — Telephone Encounter (Signed)
Referral entered. Please let patient know. Leeanne Rio, MD

## 2017-11-15 NOTE — Telephone Encounter (Signed)
Patient saw podiatry in February - she should be able to call and schedule a follow up appointment with their office. If that is not working I can put in a referral. Please advise patient to first try scheduling with podiatry office herself.  Thanks Leeanne Rio, MD

## 2017-11-15 NOTE — Telephone Encounter (Signed)
Pt called back checking on the status of this referral. Pt had an appt this morning with podiatry office, but they said she could not be seen once she got there since there was not a referral in. Please place a referral and let pt know once this has been placed so she can make another appt.

## 2017-11-15 NOTE — Telephone Encounter (Signed)
Pt called and would like to have a referral placed for a podiatrist.

## 2017-11-16 NOTE — Telephone Encounter (Signed)
Informed patient that referral has been placed per Dr. Ardelia Mems.  Regina Sawyer, [place]

## 2017-12-07 ENCOUNTER — Encounter: Payer: Self-pay | Admitting: Family Medicine

## 2017-12-11 ENCOUNTER — Ambulatory Visit (INDEPENDENT_AMBULATORY_CARE_PROVIDER_SITE_OTHER): Payer: 59 | Admitting: Podiatry

## 2017-12-11 DIAGNOSIS — M722 Plantar fascial fibromatosis: Secondary | ICD-10-CM | POA: Diagnosis not present

## 2017-12-11 MED ORDER — MELOXICAM 15 MG PO TABS: 15.0000 mg | ORAL_TABLET | Freq: Every day | ORAL | 1 refills | Status: DC

## 2017-12-13 NOTE — Progress Notes (Signed)
   Subjective: 62 year old female presenting today for follow up evaluation of bilateral plantar fasciitis. She states the pain is intermittent. Standing for long periods of time increases the pain. She wants to try fascial braces for treatment. Patient is here for further evaluation and treatment.   Past Medical History:  Diagnosis Date  . Asthma   . Atrophic vaginitis 2009  . BV (bacterial vaginosis) 2005  . Candida vaginitis 2006  . Candida vaginitis 2009  . Depression   . Dysuria 2007  . Female pelvic pain 2006  . Fibroid 2005  . GERD (gastroesophageal reflux disease)   . H/O constipation 2009  . H/O dyspareunia 2008  . H/O urinary frequency 2011  . H/O vaginitis 2005  . Heart murmur   . History of bacterial infection   . History of hemorrhoids 2009  . Hx: UTI (urinary tract infection) 2007  . Hyperlipidemia   . Hypertension   . Irregular periods/menstrual cycles 2007  . Libido, decreased 2006  . Menopausal symptoms 2007  . Nocturia 2009  . Proteinuria 2009  . Sickle cell trait ([place])   . Tenderness of female pelvic organs 2005  . Ulcer   . Vaginal irritation 2011     Objective: Physical Exam General: The patient is alert and oriented x3 in no acute distress.  Dermatology: Skin is warm, dry and supple bilateral lower extremities. Negative for open lesions or macerations bilateral.   Vascular: Dorsalis Pedis and Posterior Tibial pulses palpable bilateral.  Capillary fill time is immediate to all digits.  Neurological: Epicritic and protective threshold intact bilateral.   Musculoskeletal: Tenderness to palpation to the plantar aspect of the bilateral heels along the plantar fascia. All other joints range of motion within normal limits bilateral. Strength 5/5 in all groups bilateral.   Assessment: 1. plantar fasciitis bilateral feet  Plan of Care:  1. Patient evaluated. 2. Declined injections.  3. Prescription for Meloxicam provided to patient.  4. Plantar  fascial band(s) dispensed for bilateral plantar fasciitis. 5. Instructed patient regarding therapies and modalities at home to alleviate symptoms.  6. Return to clinic as needed.    Edrick Kins, DPM [facility]  Dr. Edrick Kins, DPM    [address], [place] [address]                Office [phone]  Fax [phone]

## 2018-01-02 ENCOUNTER — Telehealth: Payer: Self-pay | Admitting: Gastroenterology

## 2018-01-02 NOTE — Telephone Encounter (Signed)
Patient had some rectal bleeding last week after having to strain to have a bm. She is having rectal tenderness still. Advised to try some OTC Recticare, and have made her an appointment with [facility].

## 2018-01-03 ENCOUNTER — Other Ambulatory Visit: Payer: Self-pay | Admitting: Family Medicine

## 2018-01-04 ENCOUNTER — Ambulatory Visit (INDEPENDENT_AMBULATORY_CARE_PROVIDER_SITE_OTHER): Payer: 59 | Admitting: Gastroenterology

## 2018-01-04 ENCOUNTER — Encounter: Payer: Self-pay | Admitting: Gastroenterology

## 2018-01-04 VITALS — BP 136/80 | HR 64 | Ht 63.0 in | Wt 162.2 lb

## 2018-01-04 DIAGNOSIS — K625 Hemorrhage of anus and rectum: Secondary | ICD-10-CM | POA: Diagnosis not present

## 2018-01-04 DIAGNOSIS — K5909 Other constipation: Secondary | ICD-10-CM | POA: Diagnosis not present

## 2018-01-04 MED ORDER — HYDROCORTISONE ACETATE 25 MG RE SUPP: RECTAL | 0 refills | Status: DC

## 2018-01-04 NOTE — Patient Instructions (Addendum)
We sent a prescription to [facility] [address] [address]  1. Hydrocortisone suppositories  Take Benefiber or Citrucel powder daily- 2 teaspoon  In 8 oz of liquid, water, juice.  Call us back for ongoing issues.   Normal BMI (Body Mass Index- based on height and weight) is between 19 and 25. Your BMI today is Body mass index is 28.74 kg/m. Marland Kitchen Please consider follow up  regarding your BMI with your Primary Care Provider.

## 2018-01-04 NOTE — Progress Notes (Signed)
01/04/2018 Regina Sawyer [id number] 1955/12/13   HISTORY OF PRESENT ILLNESS:  This is a 62 year old female who is known to Dr. Havery Moros.  Her last colonoscopy was in July 2016 by Dr. Deatra Ina at which time she was found to have 2 polyps that were removed and were tubular adenomas.  She was entered for a recall for 5 years.  She presents her office today with complaints of rectal bleeding.  She says that she has never had any bleeding in the past.  A few days ago she had a bowel movement and then saw some bright red blood on the toilet paper upon wiping.  She does admit to constipation and straining with bowel movements including the day of the bleeding issue.  Bleeding resolved right away and has not seen any since that time.   Past Medical History:  Diagnosis Date  . Asthma   . Atrophic vaginitis 2009  . BV (bacterial vaginosis) 2005  . Candida vaginitis 2006  . Candida vaginitis 2009  . Depression   . Dysuria 2007  . Female pelvic pain 2006  . Fibroid 2005  . GERD (gastroesophageal reflux disease)   . H/O constipation 2009  . H/O dyspareunia 2008  . H/O urinary frequency 2011  . H/O vaginitis 2005  . Heart murmur   . History of bacterial infection   . History of hemorrhoids 2009  . Hx: UTI (urinary tract infection) 2007  . Hyperlipidemia   . Hypertension   . Irregular periods/menstrual cycles 2007  . Libido, decreased 2006  . Menopausal symptoms 2007  . Nocturia 2009  . Proteinuria 2009  . Sickle cell trait ([place])   . Tenderness of female pelvic organs 2005  . Ulcer   . Vaginal irritation 2011   Past Surgical History:  Procedure Laterality Date  . CARPAL TUNNEL RELEASE     both wrists  . COLONOSCOPY N/A 10/28/2014   Procedure: COLONOSCOPY;  Surgeon: Inda Castle, MD;  Location: [facility] END[facility]COPY;  Service: Endoscopy;  Laterality: N/A;  . ESOPHAGOGASTRODUODENOSCOPY N/A 08/06/2014   Procedure: ESOPHAGOGASTRODUODENOSCOPY (EGD);  Surgeon: Inda Castle, MD;  Location:  [facility];  Service: Endoscopy;  Laterality: N/A;  . TUBAL LIGATION      reports that she is a non-smoker but has been exposed to tobacco smoke. She has never used smokeless tobacco. She reports that she does not drink alcohol or use drugs. family history includes Colon cancer in her father; Diabetes in her other and sister; Gallstones in her mother; Heart failure in her brother and father; Hypertension in her sister. Allergies  Allergen Reactions  . Lipitor [Atorvastatin] Other (See Comments)    Abdominal pain, severe nausea, muscle / body aches  . Lactose   . Latex Hives, Itching and Rash      Outpatient Encounter Medications as of 01/04/2018  Medication Sig  . albuterol (PROVENTIL HFA;VENTOLIN HFA) 108 (90 Base) MCG/ACT inhaler Inhale 1-2 puffs into the lungs every 6 (six) hours as needed for wheezing or shortness of breath.  . carvedilol (COREG) 6.25 MG tablet Take 1 tablet (6.25 mg total) by mouth 2 (two) times daily with a meal.  . DEXILANT 30 MG capsule TAKE 1 CAPSULE BY MOUTH EVERY DAY  . lisinopril (PRINIVIL,ZESTRIL) 20 MG tablet TAKE 1 TABLET BY MOUTH EVERY DAY  . mirtazapine (REMERON) 15 MG tablet TAKE 1 TABLET BY MOUTH EVERYDAY AT BEDTIME  . rosuvastatin (CRESTOR) 20 MG tablet TAKE 1 TABLET BY MOUTH EVERY DAY  .  hydrocortisone (ANUSOL-HC) 25 MG suppository Use 1 suppository rectally at bedtime for 7 days.  . [DISCONTINUED] benzonatate (TESSALON) 100 MG capsule Take 1 capsule (100 mg total) by mouth 3 (three) times daily as needed.  . [DISCONTINUED] HYDROcodone-homatropine (HYCODAN) 5-1.5 MG/5ML syrup Take 5 mLs by mouth every 6 (six) hours as needed for cough.  . [DISCONTINUED] meloxicam (MOBIC) 15 MG tablet Take 1 tablet (15 mg total) by mouth daily.  . [DISCONTINUED] Multiple Vitamins-Minerals (MULTIVITAMIN WITH MINERALS) tablet Take 1 tablet by mouth daily.   No facility-administered encounter medications on file as of 01/04/2018.      REVIEW OF SYSTEMS  : All other  systems reviewed and negative except where noted in the History of Present Illness.   PHYSICAL EXAM: BP 136/80 (BP Location: Left Arm, Patient Position: Sitting, Cuff Size: Normal)   Pulse 64   Ht 5\' 3"  (1.6 m) Comment: height measured without shoes  Wt 162 lb 4 oz (73.6 kg)   BMI 28.74 kg/m  General: Well developed black female in no acute distress Head: Normocephalic and atraumatic Eyes:  Sclerae anicteric, conjunctiva pink. Ears: Normal auditory acuity Lungs: Clear throughout to auscultation; no increased WOB. Heart: Regular rate and rhythm; no M/R/G. Abdomen: Soft, non-distended.  BS present.  Non-tender. Rectal:  Anterior skin tag noted.  No other abnormalities noted.  DRE did not reveal any tenderness or masses.  Anoscopy performed and there was some minimally bleeding posteriorly (? From small hemorrhoidal tissue vs anoscopy trauma). Musculoskeletal: Symmetrical with no gross deformities  Skin: No lesions on visible extremities Extremities: No edema  Neurological: Alert oriented x 4, grossly non-focal Psychological:  Alert and cooperative. Normal mood and affect  ASSESSMENT AND PLAN: *Rectal bleeding:  ? Hemorrhoidal.  There was small area in rectum with bleeding today.  ? Small hemorrhoid vs anoscope trauma.  Will treat with with hydrocortisone suppositories at bedtime for 7 days.  Needs to keep stools soft as below. *Constipation:  Will begin a daily powder fiber supplement such as Benefiber or Citrucel.   CC:  Leeanne Rio, MD

## 2018-01-05 ENCOUNTER — Telehealth: Payer: Self-pay | Admitting: Gastroenterology

## 2018-01-05 NOTE — Telephone Encounter (Signed)
The pt wanted to let the office know that she picked up generic suppository.

## 2018-01-05 NOTE — Progress Notes (Signed)
Agree with assessment and plan as outlined.  

## 2018-03-05 ENCOUNTER — Ambulatory Visit: Payer: Self-pay | Admitting: Family Medicine

## 2018-03-28 ENCOUNTER — Other Ambulatory Visit: Payer: Self-pay | Admitting: Family Medicine

## 2018-03-28 ENCOUNTER — Other Ambulatory Visit: Payer: Self-pay | Admitting: Gastroenterology

## 2018-03-29 ENCOUNTER — Other Ambulatory Visit: Payer: Self-pay | Admitting: Family Medicine

## 2018-04-05 ENCOUNTER — Ambulatory Visit: Payer: 59 | Admitting: Family Medicine

## 2018-04-08 ENCOUNTER — Other Ambulatory Visit: Payer: Self-pay | Admitting: Family Medicine

## 2018-04-15 ENCOUNTER — Other Ambulatory Visit: Payer: Self-pay | Admitting: Family Medicine

## 2018-04-16 NOTE — Telephone Encounter (Signed)
Please ask patient to schedule follow up with me, we need to recheck her kidney function & ensure blood pressure is doing well.  Leeanne Rio, MD

## 2018-04-19 NOTE — Telephone Encounter (Signed)
Pt scheduled for an appt. Lakiyah Arntson, CMA  

## 2018-04-30 ENCOUNTER — Ambulatory Visit: Payer: Self-pay | Admitting: Family Medicine

## 2018-05-03 ENCOUNTER — Other Ambulatory Visit: Payer: Self-pay | Admitting: Family Medicine

## 2018-05-07 ENCOUNTER — Ambulatory Visit (INDEPENDENT_AMBULATORY_CARE_PROVIDER_SITE_OTHER): Payer: 59 | Admitting: Family Medicine

## 2018-05-07 VITALS — BP 125/75 | HR 78 | Temp 99.5°F | Wt 167.4 lb

## 2018-05-07 DIAGNOSIS — F418 Other specified anxiety disorders: Secondary | ICD-10-CM

## 2018-05-07 DIAGNOSIS — K219 Gastro-esophageal reflux disease without esophagitis: Secondary | ICD-10-CM | POA: Diagnosis not present

## 2018-05-07 DIAGNOSIS — I10 Essential (primary) hypertension: Secondary | ICD-10-CM

## 2018-05-07 MED ORDER — ESCITALOPRAM OXALATE 20 MG PO TABS: 20.0000 mg | ORAL_TABLET | Freq: Every day | ORAL | 2 refills | Status: DC

## 2018-05-07 MED ORDER — GUAIFENESIN ER 600 MG PO TB12: 600.0000 mg | ORAL_TABLET | Freq: Two times a day (BID) | ORAL | 0 refills | Status: DC | PRN

## 2018-05-07 NOTE — Assessment & Plan Note (Signed)
Doing well on dexilant, continue.

## 2018-05-07 NOTE — Assessment & Plan Note (Signed)
Doing well on lexapro 20mg  daily. Will refill this medication as patient prefers not to continue seeing the [place].

## 2018-05-07 NOTE — Progress Notes (Signed)
Date of Visit: 05/07/2018   HPI:  Patient presents for routine follow up.   Hypertension - currently taking carvedilol 6.25mg  twice daily and lisinopril 20mg  daily. Tolerating these well. Denies chest pain or shortness of breath. No swelling.  Anxiety - was started on lexapro 20mg  daily by the [facility] for anxiety. Has been on this medication for several months and finds it helpful. Also saw a counselor there several times but does not want to continue having this medication rx'd there as they would require her to keep seeing a counselor. Feels like things are going well and she no longer needs counseling. Denies SI/HI.   Cough - got sick on Friday after visiting her daughter, who had a cold. Has felt hot & cold on and off but no known fevers. Coughing up yellow phlegm. Ears and head also feel congested. Feels sore all over. Took robitussin which hasn't helped much. Eating and drinking well.   GERD - taking dexilant 30mg  daily. GERD well controlled with this medication.   ROS: See HPI.  [place]: history of hypertension, hyperlipidemia, GERD  PHYSICAL EXAM: BP 125/75   Pulse 78   Temp 99.5 F (37.5 C) (Oral)   Wt 167 lb 6.4 oz (75.9 kg)   SpO2 91%   BMI 29.65 kg/m   Repeat pulse ox 98% on room air Gen: no acute distress, pleasant, cooperative HEENT: normocephalic, atraumatic, moist mucous membranes, oropharynx mildly erythematous but no exudates. Tympanic membranes clear bilaterally. Nares patent. Heart: regular rate and rhythm, no murmur Lungs: clear to auscultation bilaterally, normal work of breathing  Neuro: alert, grossly nonfocal, speech normal Ext: No appreciable lower extremity edema bilaterally   ASSESSMENT/PLAN:  Health maintenance:  -patient declined flu shot today  HYPERTENSION, BENIGN ESSENTIAL Well controlled. Continue current regimen. Check BMET today to monitor renal function.   GASTROESOPHAGEAL REFLUX, NO ESOPHAGITIS Doing well on dexilant,  continue.  Anxiety disorder, unspecified Doing well on lexapro 20mg  daily. Will refill this medication as patient prefers not to continue seeing the [place].  Cough  Likely viral, no signs of bacterial infection at present. rx guaifenesin for cough. Follow up if not improving later this week.   FOLLOW UP: Follow up in 3 months for above issues  [place] J. Ardelia Mems, [place]

## 2018-05-07 NOTE — Assessment & Plan Note (Signed)
Well controlled. Continue current regimen. Check BMET today to monitor renal function.

## 2018-05-07 NOTE — Patient Instructions (Signed)
It was great to see you again today!  Checking kidney function today  Refilled your escitalopram  Sent in cough medicine Return if worsening or not better by later this week  Be well, Dr. Ardelia Mems

## 2018-05-08 ENCOUNTER — Encounter: Payer: Self-pay | Admitting: Family Medicine

## 2018-05-08 LAB — BASIC METABOLIC PANEL
BUN/Creatinine Ratio: 7 — ABNORMAL LOW (ref 12–28)
BUN: 8 mg/dL (ref 8–27)
CO2: 26 mmol/L (ref 20–29)
CREATININE: 1.07 mg/dL — AB (ref 0.57–1.00)
Calcium: 9.4 mg/dL (ref 8.7–10.3)
Chloride: 102 mmol/L (ref 96–106)
GFR calc Af Amer: 65 mL/min/{1.73_m2} (ref >59)
GFR calc non Af Amer: 57 mL/min/{1.73_m2} — ABNORMAL LOW (ref >59)
Glucose: 97 mg/dL (ref 65–99)
Potassium: 4.4 mmol/L (ref 3.5–5.2)
Sodium: 143 mmol/L (ref 134–144)

## 2018-05-16 ENCOUNTER — Ambulatory Visit: Payer: 59

## 2018-05-17 ENCOUNTER — Encounter (HOSPITAL_COMMUNITY): Payer: Self-pay | Admitting: Family Medicine

## 2018-05-17 ENCOUNTER — Ambulatory Visit (HOSPITAL_COMMUNITY)
Admission: EM | Admit: 2018-05-17 | Discharge: 2018-05-17 | Disposition: A | Discharge status: Home / Self Care | Payer: 59 | Attending: Family Medicine | Admitting: Family Medicine

## 2018-05-17 DIAGNOSIS — R059 Cough, unspecified: Secondary | ICD-10-CM

## 2018-05-17 DIAGNOSIS — R05 Cough: Secondary | ICD-10-CM

## 2018-05-17 DIAGNOSIS — M62830 Muscle spasm of back: Secondary | ICD-10-CM | POA: Diagnosis not present

## 2018-05-17 MED ORDER — PREDNISONE 20 MG PO TABS: ORAL_TABLET | ORAL | 0 refills | Status: DC

## 2018-05-17 NOTE — Discharge Instructions (Addendum)
Return if pain continues

## 2018-05-17 NOTE — ED Triage Notes (Signed)
Pt c/o L upper back pain x3 or 4 days. Painful with bending over.

## 2018-05-17 NOTE — ED Provider Notes (Signed)
[place]    CSN: [id number] Arrival date & time: 05/17/18  1344     History   Chief Complaint Chief Complaint  Patient presents with  . Back Pain    HPI Regina Sawyer is a 63 y.o. female.   63 year old established [facility] urgent care patient who comes in complaining of left sided back pain starting 3 days ago after having a cough. Cough is resolved.  Reports the pain is intermittently achy "5" on 0- 10 scale. Improves with rest and increases with movement. Denies urinary symptoms.     Past Medical History:  Diagnosis Date  . Asthma   . Atrophic vaginitis 2009  . BV (bacterial vaginosis) 2005  . Candida vaginitis 2006  . Candida vaginitis 2009  . Depression   . Dysuria 2007  . Female pelvic pain 2006  . Fibroid 2005  . GERD (gastroesophageal reflux disease)   . H/O constipation 2009  . H/O dyspareunia 2008  . H/O urinary frequency 2011  . H/O vaginitis 2005  . Heart murmur   . History of bacterial infection   . History of hemorrhoids 2009  . Hx: UTI (urinary tract infection) 2007  . Hyperlipidemia   . Hypertension   . Irregular periods/menstrual cycles 2007  . Libido, decreased 2006  . Menopausal symptoms 2007  . Nocturia 2009  . Proteinuria 2009  . Sickle cell trait ([place])   . Tenderness of female pelvic organs 2005  . Ulcer   . Vaginal irritation 2011    Patient Active Problem List   Diagnosis Date Noted  . Rectal bleeding 01/04/2018  . Other constipation 01/04/2018  . Anxiety disorder, unspecified 01/03/2017  . Sleep concern 06/16/2016  . Right ear pain 04/22/2016  . Cough 04/22/2016  . Hypercalcemia 01/14/2016  . Erythema and swelling of lower extremity 09/07/2015  . Right foot pain 12/09/2014  . Benign neoplasm of ascending colon 10/28/2014  . Internal hemorrhoids 10/28/2014  . Vulvar lesion 10/07/2014  . Abdominal pain, epigastric 07/10/2014  . Dry mouth 07/10/2014  . Weight loss 06/04/2014  . Prediabetes 05/13/2014  .  Elevated serum creatinine 05/12/2014  . Injury of left index finger 01/23/2014  . Arm pain, left 01/23/2014  . Cervical polyp 10/14/2013  . Vaginal itching 11/03/2012  . Cystitis 10/16/2012  . Hyperlipidemia 08/26/2009  . VAGINITIS, ATROPHIC 05/08/2008  . POSTMENOPAUSAL BLEEDING 11/29/2007  . SYSTOLIC MURMUR 61/47/0929  . HYPERTENSION, BENIGN ESSENTIAL 02/05/2007  . ANEMIA, IRON DEFICIENCY, UNSPEC. 06/15/2006  . SICKLE CELL TRAIT 06/15/2006  . RHINITIS, ALLERGIC 06/15/2006  . GASTROESOPHAGEAL REFLUX, NO ESOPHAGITIS 06/15/2006    Past Surgical History:  Procedure Laterality Date  . CARPAL TUNNEL RELEASE     both wrists  . COLONOSCOPY N/A 10/28/2014   Procedure: COLONOSCOPY;  Surgeon: Inda Castle, MD;  Location: [facility];  Service: Endoscopy;  Laterality: N/A;  . ESOPHAGOGASTRODUODENOSCOPY N/A 08/06/2014   Procedure: ESOPHAGOGASTRODUODENOSCOPY (EGD);  Surgeon: Inda Castle, MD;  Location: [facility];  Service: Endoscopy;  Laterality: N/A;  . TUBAL LIGATION      OB History    Gravida  2   Para  2   Term  2   Preterm  0   AB  0   Living  2     SAB  0   TAB  0   Ectopic  0   Multiple  0   Live Births               Home Medications  Prior to Admission medications   Medication Sig Start Date End Date Taking? Authorizing Provider  carvedilol (COREG) 6.25 MG tablet TAKE 1 TABLET (6.25 MG TOTAL) BY MOUTH 2 (TWO) TIMES DAILY WITH A MEAL. 03/29/18   Leeanne Rio, MD  DEXILANT 30 MG capsule TAKE 1 CAPSULE BY MOUTH EVERY DAY 03/28/18   Armbruster, Carlota Raspberry, MD  escitalopram (LEXAPRO) 20 MG tablet Take 1 tablet (20 mg total) by mouth daily. 05/07/18   Leeanne Rio, MD  lisinopril (PRINIVIL,ZESTRIL) 20 MG tablet TAKE 1 TABLET BY MOUTH EVERY DAY 04/16/18   Leeanne Rio, MD  mirtazapine (REMERON) 15 MG tablet TAKE 1 TABLET BY MOUTH EVERYDAY AT BEDTIME 05/07/18   Leeanne Rio, MD  predniSONE (DELTASONE) 20 MG tablet one daily  with food 05/17/18   Robyn Haber, MD  rosuvastatin (CRESTOR) 20 MG tablet TAKE 1 TABLET BY MOUTH EVERY DAY 04/09/18   Alveda Reasons, MD    Family History Family History  Problem Relation Age of Onset  . Diabetes Other   . Diabetes Sister   . Hypertension Sister   . Colon cancer Father   . Heart failure Father   . Heart failure Brother        x2  . Gallstones Mother   . Colon polyps Neg Hx   . Kidney disease Neg Hx   . Esophageal cancer Neg Hx   . Gallbladder disease Neg Hx     Social History Social History   Tobacco Use  . Smoking status: Passive Smoke Exposure - Never Smoker  . Smokeless tobacco: Never Used  Substance Use Topics  . Alcohol use: No  . Drug use: No     Allergies   Lipitor [atorvastatin]; Lactose; and Latex   Review of Systems Review of Systems  Respiratory: Negative for cough and shortness of breath.   Genitourinary: Negative for difficulty urinating, dysuria, frequency and urgency.  Neurological: Negative for weakness and numbness.     Physical Exam Triage Vital Signs ED Triage Vitals  Enc Vitals Group     BP      Pulse      Resp      Temp      Temp src      SpO2      Weight      Height      Head Circumference      Peak Flow      Pain Score      Pain Loc      Pain Edu?      Excl. in [place]?    No data found.  Updated Vital Signs BP (!) 124/54   Pulse 66   Temp 98 F (36.7 C)   Resp 16   SpO2 100%    Physical Exam Vitals signs and nursing note reviewed.  Constitutional:      Appearance: Normal appearance.  Eyes:     Comments: Exotropia  Neck:     Musculoskeletal: Neck supple.  Cardiovascular:     Rate and Rhythm: Normal rate and regular rhythm.  Pulmonary:     Effort: Pulmonary effort is normal.     Breath sounds: Normal breath sounds.  Musculoskeletal:        General: Tenderness and deformity present. No signs of injury.       Arms:     Comments: Pain to palpation left lower ribs posteriorly  Skin:     General: Skin is warm and dry.  Neurological:  General: No focal deficit present.      UC Treatments / Results  Labs (all labs ordered are listed, but only abnormal results are displayed) Labs Reviewed - No data to display  EKG None  Radiology No results found.  Procedures Procedures (including critical care time)  Medications Ordered in UC Medications - No data to display  Initial Impression / Assessment and Plan / UC Course  I have reviewed the triage vital signs and the nursing notes.  Pertinent labs & imaging results that were available during my care of the patient were reviewed by me and considered in my medical decision making (see chart for details).    Final Clinical Impressions(s) / UC Diagnoses   Final diagnoses:  Muscle spasm of back  Cough     Discharge Instructions     Return if pain continues    ED Prescriptions    Medication Sig Dispense Auth. Provider   predniSONE (DELTASONE) 20 MG tablet one daily with food 5 tablet Robyn Haber, MD     Controlled Substance Prescriptions [place] Controlled Substance Registry consulted? Not Applicable   Robyn Haber, MD 05/17/18 1515

## 2018-06-20 ENCOUNTER — Ambulatory Visit (INDEPENDENT_AMBULATORY_CARE_PROVIDER_SITE_OTHER): Payer: 59 | Admitting: Podiatry

## 2018-06-20 ENCOUNTER — Encounter: Payer: Self-pay | Admitting: Podiatry

## 2018-06-20 DIAGNOSIS — M722 Plantar fascial fibromatosis: Secondary | ICD-10-CM

## 2018-06-20 MED ORDER — METHYLPREDNISOLONE 4 MG PO TBPK: ORAL_TABLET | ORAL | 0 refills | Status: DC

## 2018-06-20 NOTE — Patient Instructions (Signed)
Pre-Operative Instructions  Congratulations, you have decided to take an important step towards improving your quality of life.  You can be assured that the doctors and staff at [facility] will be with you every step of the way.  Here are some important things you should know:  1. Plan to be at the surgery center/hospital at least 1 (one) hour prior to your scheduled time, unless otherwise directed by the surgical center/hospital staff.  You must have a responsible adult accompany you, remain during the surgery and drive you home.  Make sure you have directions to the surgical center/hospital to ensure you arrive on time. 2. If you are having surgery at [facility] or [facility] hospitals, you will need a copy of your medical history and physical form from your family physician within one month prior to the date of surgery. We will give you a form for your primary physician to complete.  3. We make every effort to accommodate the date you request for surgery.  However, there are times where surgery dates or times have to be moved.  We will contact you as soon as possible if a change in schedule is required.   4. No aspirin/ibuprofen for one week before surgery.  If you are on aspirin, any non-steroidal anti-inflammatory medications (Mobic, Aleve, Ibuprofen) should not be taken seven (7) days prior to your surgery.  You make take Tylenol for pain prior to surgery.  5. Medications - If you are taking daily heart and blood pressure medications, seizure, reflux, allergy, asthma, anxiety, pain or diabetes medications, make sure you notify the surgery center/hospital before the day of surgery so they can tell you which medications you should take or avoid the day of surgery. 6. No food or drink after midnight the night before surgery unless directed otherwise by surgical center/hospital staff. 7. No alcoholic beverages 24-hours prior to surgery.  No smoking 24-hours prior or 24-hours after  surgery. 8. Wear loose pants or shorts. They should be loose enough to fit over bandages, boots, and casts. 9. Don't wear slip-on shoes. Sneakers are preferred. 10. Bring your boot with you to the surgery center/hospital.  Also bring crutches or a walker if your physician has prescribed it for you.  If you do not have this equipment, it will be provided for you after surgery. 11. If you have not been contacted by the surgery center/hospital by the day before your surgery, call to confirm the date and time of your surgery. 12. Leave-time from work may vary depending on the type of surgery you have.  Appropriate arrangements should be made prior to surgery with your employer. 13. Prescriptions will be provided immediately following surgery by your doctor.  Fill these as soon as possible after surgery and take the medication as directed. Pain medications will not be refilled on weekends and must be approved by the doctor. 14. Remove nail polish on the operative foot and avoid getting pedicures prior to surgery. 15. Wash the night before surgery.  The night before surgery wash the foot and leg well with water and the antibacterial soap provided. Be sure to pay special attention to beneath the toenails and in between the toes.  Wash for at least three (3) minutes. Rinse thoroughly with water and dry well with a towel.  Perform this wash unless told not to do so by your physician.  Enclosed: 1 Ice pack (please put in freezer the night before surgery)   1 Hibiclens skin cleaner     Pre-op instructions  If you have any questions regarding the instructions, please do not hesitate to call our office.  [place]: [address], [place], [place] [address] -- [phone]  [place]: [address] , [place] [address] -- [phone]  [place]: [address]  [place], [place] [address] -- [phone]  [place]: [address], [place], [place] [address] -- [phone]  Website:  [email or web address] 

## 2018-06-25 NOTE — Progress Notes (Signed)
   Subjective: 63 year old female presenting today for follow up evaluation of bilateral plantar fasciitis. She reports a flare up of pain that began 1-2 weeks ago. She states the right foot hurts worse than the left. She denies any new or increased activity. She denies modifying factors and has been taking Tylenol for treatment. Patient is here for further evaluation and treatment.   Past Medical History:  Diagnosis Date  . Asthma   . Atrophic vaginitis 2009  . BV (bacterial vaginosis) 2005  . Candida vaginitis 2006  . Candida vaginitis 2009  . Depression   . Dysuria 2007  . Female pelvic pain 2006  . Fibroid 2005  . GERD (gastroesophageal reflux disease)   . H/O constipation 2009  . H/O dyspareunia 2008  . H/O urinary frequency 2011  . H/O vaginitis 2005  . Heart murmur   . History of bacterial infection   . History of hemorrhoids 2009  . Hx: UTI (urinary tract infection) 2007  . Hyperlipidemia   . Hypertension   . Irregular periods/menstrual cycles 2007  . Libido, decreased 2006  . Menopausal symptoms 2007  . Nocturia 2009  . Proteinuria 2009  . Sickle cell trait ([place])   . Tenderness of female pelvic organs 2005  . Ulcer   . Vaginal irritation 2011     Objective: Physical Exam General: The patient is alert and oriented x3 in no acute distress.  Dermatology: Skin is warm, dry and supple bilateral lower extremities. Negative for open lesions or macerations bilateral.   Vascular: Dorsalis Pedis and Posterior Tibial pulses palpable bilateral.  Capillary fill time is immediate to all digits.  Neurological: Epicritic and protective threshold intact bilateral.   Musculoskeletal: Tenderness to palpation to the plantar aspect of the bilateral heels along the plantar fascia. All other joints range of motion within normal limits bilateral. Strength 5/5 in all groups bilateral.   Assessment: 1. plantar fasciitis bilateral feet, right greater than left   Plan of Care:  1.  Patient evaluated. 2. Declined injections.  3. Prescription for Medrol Dose Pak provided to patient.  4. Cannot take oral NSAIDs. Continue taking OTC Tylenol as needed.  5. Today we discussed the conservative versus surgical management of the presenting pathology. The patient opts for surgical management. All possible complications and details of the procedure were explained. All patient questions were answered. No guarantees were expressed or implied. 6. Authorization for surgery was initiated today. Surgery will consist of EPF right.  7. Return to clinic one week post op.       Edrick Kins, DPM [facility]  Dr. Edrick Kins, DPM    [address], [place] [address]                Office [phone]  Fax [phone]

## 2018-06-27 ENCOUNTER — Telehealth: Payer: Self-pay | Admitting: *Deleted

## 2018-06-27 NOTE — Telephone Encounter (Signed)
"  I'm calling to see when my surgery is.  Can you give me a call back?"

## 2018-06-28 NOTE — Telephone Encounter (Signed)
"  I'm calling to see if you have any time available before April 27 for my surgery."  You are scheduled to have surgery on April 23.  Dr. Amalia Hailey does not have anything available before that time.   Would you like to schedule a follow-up appointment with Dr. Amalia Hailey?  "No, I'll just keep taking Tylenol."

## 2018-07-02 ENCOUNTER — Other Ambulatory Visit: Payer: Self-pay | Admitting: Family Medicine

## 2018-07-07 ENCOUNTER — Other Ambulatory Visit: Payer: Self-pay | Admitting: Family Medicine

## 2018-07-09 ENCOUNTER — Ambulatory Visit: Payer: 59 | Admitting: Podiatry

## 2018-07-09 ENCOUNTER — Telehealth: Payer: Self-pay | Admitting: *Deleted

## 2018-07-09 NOTE — Telephone Encounter (Signed)
"  This is Regina Sawyer."  How can I help you?  "Is my surgery still scheduled for April 23?"  Yes, it is still scheduled for August 09, 2018.  "How long will I be out?"  How long will you be out of work?  "Yes, how long will I be out of work?"  Do you stand on your job?  "Yes, I stand."  You could be out of work for about four weeks.  "Okay, thank you."

## 2018-07-11 ENCOUNTER — Other Ambulatory Visit: Payer: Self-pay

## 2018-07-11 ENCOUNTER — Other Ambulatory Visit: Payer: Self-pay | Admitting: Podiatry

## 2018-07-11 ENCOUNTER — Ambulatory Visit (INDEPENDENT_AMBULATORY_CARE_PROVIDER_SITE_OTHER): Payer: 59 | Admitting: Podiatry

## 2018-07-11 ENCOUNTER — Ambulatory Visit (INDEPENDENT_AMBULATORY_CARE_PROVIDER_SITE_OTHER): Payer: 59

## 2018-07-11 DIAGNOSIS — M79671 Pain in right foot: Secondary | ICD-10-CM

## 2018-07-11 DIAGNOSIS — R300 Dysuria: Secondary | ICD-10-CM | POA: Insufficient documentation

## 2018-07-11 DIAGNOSIS — M79672 Pain in left foot: Secondary | ICD-10-CM | POA: Diagnosis not present

## 2018-07-11 DIAGNOSIS — M722 Plantar fascial fibromatosis: Secondary | ICD-10-CM

## 2018-07-11 DIAGNOSIS — R351 Nocturia: Secondary | ICD-10-CM | POA: Insufficient documentation

## 2018-07-11 DIAGNOSIS — E559 Vitamin D deficiency, unspecified: Secondary | ICD-10-CM | POA: Insufficient documentation

## 2018-07-11 NOTE — Patient Instructions (Signed)
Pre-Operative Instructions  Congratulations, you have decided to take an important step towards improving your quality of life.  You can be assured that the doctors and staff at [facility] will be with you every step of the way.  Here are some important things you should know:  1. Plan to be at the surgery center/hospital at least 1 (one) hour prior to your scheduled time, unless otherwise directed by the surgical center/hospital staff.  You must have a responsible adult accompany you, remain during the surgery and drive you home.  Make sure you have directions to the surgical center/hospital to ensure you arrive on time. 2. If you are having surgery at [facility] or  hospitals, you will need a copy of your medical history and physical form from your family physician within one month prior to the date of surgery. We will give you a form for your primary physician to complete.  3. We make every effort to accommodate the date you request for surgery.  However, there are times where surgery dates or times have to be moved.  We will contact you as soon as possible if a change in schedule is required.   4. No aspirin/ibuprofen for one week before surgery.  If you are on aspirin, any non-steroidal anti-inflammatory medications (Mobic, Aleve, Ibuprofen) should not be taken seven (7) days prior to your surgery.  You make take Tylenol for pain prior to surgery.  5. Medications - If you are taking daily heart and blood pressure medications, seizure, reflux, allergy, asthma, anxiety, pain or diabetes medications, make sure you notify the surgery center/hospital before the day of surgery so they can tell you which medications you should take or avoid the day of surgery. 6. No food or drink after midnight the night before surgery unless directed otherwise by surgical center/hospital staff. 7. No alcoholic beverages 24-hours prior to surgery.  No smoking 24-hours prior or 24-hours after  surgery. 8. Wear loose pants or shorts. They should be loose enough to fit over bandages, boots, and casts. 9. Don't wear slip-on shoes. Sneakers are preferred. 10. Bring your boot with you to the surgery center/hospital.  Also bring crutches or a walker if your physician has prescribed it for you.  If you do not have this equipment, it will be provided for you after surgery. 11. If you have not been contacted by the surgery center/hospital by the day before your surgery, call to confirm the date and time of your surgery. 12. Leave-time from work may vary depending on the type of surgery you have.  Appropriate arrangements should be made prior to surgery with your employer. 13. Prescriptions will be provided immediately following surgery by your doctor.  Fill these as soon as possible after surgery and take the medication as directed. Pain medications will not be refilled on weekends and must be approved by the doctor. 14. Remove nail polish on the operative foot and avoid getting pedicures prior to surgery. 15. Wash the night before surgery.  The night before surgery wash the foot and leg well with water and the antibacterial soap provided. Be sure to pay special attention to beneath the toenails and in between the toes.  Wash for at least three (3) minutes. Rinse thoroughly with water and dry well with a towel.  Perform this wash unless told not to do so by your physician.  Enclosed: 1 Ice pack (please put in freezer the night before surgery)   1 Hibiclens skin cleaner     Pre-op instructions  If you have any questions regarding the instructions, please do not hesitate to call our office.  [place]: [address], [place], [place] [address] -- [phone]  [place]: [address] [place], [place] [address] -- [phone]  [place]: [address]  [place], [place] [address] -- [phone]  [place]: [address], [place], [place] [address] -- [phone]  Website:  [email or web address] 

## 2018-07-11 NOTE — Progress Notes (Signed)
   Subjective: 62 year old female presenting today for follow up evaluation of bilateral plantar fasciitis.  Pain has been unchanged since last visit on 06/20/2018.  Patient states that last visit she was unable to sign the authorization for surgery and paperwork.  She presents today for surgical consult.  No new complaints at this time  Past Medical History:  Diagnosis Date  . Asthma   . Atrophic vaginitis 2009  . BV (bacterial vaginosis) 2005  . Candida vaginitis 2006  . Candida vaginitis 2009  . Depression   . Dysuria 2007  . Female pelvic pain 2006  . Fibroid 2005  . GERD (gastroesophageal reflux disease)   . H/O constipation 2009  . H/O dyspareunia 2008  . H/O urinary frequency 2011  . H/O vaginitis 2005  . Heart murmur   . History of bacterial infection   . History of hemorrhoids 2009  . Hx: UTI (urinary tract infection) 2007  . Hyperlipidemia   . Hypertension   . Irregular periods/menstrual cycles 2007  . Libido, decreased 2006  . Menopausal symptoms 2007  . Nocturia 2009  . Proteinuria 2009  . Sickle cell trait ([place])   . Tenderness of female pelvic organs 2005  . Ulcer   . Vaginal irritation 2011     Objective: Physical Exam General: The patient is alert and oriented x3 in no acute distress.  Dermatology: Skin is warm, dry and supple bilateral lower extremities. Negative for open lesions or macerations bilateral.   Vascular: Dorsalis Pedis and Posterior Tibial pulses palpable bilateral.  Capillary fill time is immediate to all digits.  Neurological: Epicritic and protective threshold intact bilateral.   Musculoskeletal: Tenderness to palpation to the plantar aspect of the bilateral heels along the plantar fascia. All other joints range of motion within normal limits bilateral. Strength 5/5 in all groups bilateral.   Assessment: 1. plantar fasciitis bilateral feet, right greater than left   Plan of Care:  1. Patient evaluated. 2. Declined injections.  3.   Patient has not taken the Medrol Dosepak yet.  Recommend that she goes ahead and takes it. 4.  Patient cannot take oral NSAIDs.  Continue taking OTC Tylenol as needed 5.  Authorization for surgery was reinitiated today.  All possible complications and details the procedure were explained.  No guarantees were expressed or implied.  All patient questions answered.  Surgery will consist of EPF right foot 6.  Immobilization cam boot dispensed today 7.  Return to clinic 1 week postop     Edrick Kins, DPM [facility]  Dr. Edrick Kins, DPM    [address], [place] [address]                Office [phone]  Fax [phone]

## 2018-07-16 ENCOUNTER — Ambulatory Visit: Payer: 59 | Admitting: Podiatry

## 2018-07-23 ENCOUNTER — Telehealth: Payer: Self-pay | Admitting: Podiatry

## 2018-07-23 ENCOUNTER — Other Ambulatory Visit: Payer: Self-pay | Admitting: Podiatry

## 2018-07-23 ENCOUNTER — Telehealth: Payer: Self-pay | Admitting: *Deleted

## 2018-07-23 MED ORDER — TRAMADOL HCL 50 MG PO TABS: 50.0000 mg | ORAL_TABLET | Freq: Three times a day (TID) | ORAL | 0 refills | Status: DC | PRN

## 2018-07-23 NOTE — Telephone Encounter (Signed)
"  I'm calling regarding my surgery."  We need to reschedule your surgery.  "Why?"  We need to reschedule it due to the [place] virus.  "When is he going to do it?"  Dr. Amalia Hailey can do it Sep 13, 2018.  "Why so long?"  The surgical center is closed due to the [facility] Virus.  "What time do I need to be there?"  You will get a call from someone at the surgical center a day or two prior to your surgery date and they will give you your time.  I changed the date from 08/09/2018 to 09/13/2018 via the surgical center's [employer] Portal.

## 2018-07-23 NOTE — Telephone Encounter (Signed)
I called pt and she states her heel is painful and swollen. I asked pt if she was icing the heel and she stated no. I informed pt she should ice 3-4 times day for 15-20 minutes protecting the skin from the ice with a light towel. I told pt I would inform Dr. Amalia Hailey of her need for pain medication.

## 2018-07-23 NOTE — Telephone Encounter (Signed)
Patient is in pain, she is requesting pain medication

## 2018-07-23 NOTE — Progress Notes (Signed)
Prn pain

## 2018-07-30 ENCOUNTER — Other Ambulatory Visit: Payer: Self-pay | Admitting: Family Medicine

## 2018-08-06 ENCOUNTER — Telehealth: Payer: Self-pay | Admitting: *Deleted

## 2018-08-06 NOTE — Telephone Encounter (Signed)
I am calling to let you know the surgical center has reopened.  I want to know if you would like me to reschedule it back to April 23, which is this Thursday or he can do it on August 16, 2018.  "I'd rather do it on April 30."  I'll get it rescheduled.  Someone from the surgical center will give you a call a day or two before your surgery date and they will give you your arrival time.  I rescheduled the surgery from 09/13/2018 to 08/16/2018 via the surgical center's [employer] Portal.

## 2018-08-07 ENCOUNTER — Telehealth: Payer: Self-pay | Admitting: *Deleted

## 2018-08-07 NOTE — Telephone Encounter (Signed)
"  I'm calling to see when I'm supposed to have my surgery.  The lady called me yesterday.  It was for the thirtieth of April.  I need to ask you something.  Can you call me back?"  "My surgery was rescheduled to April 30.  Where do I go for it?"  You're going to [facility].  They're located on [address] which [place] over [employer].  Do you have the little grey bag that we gave you?  "Yes, I have it."  It should have a brochure in it about the surgical center.  The address is on the back of the brochure.  "When will I know the time to be there?"  Someone from the surgical center will call you a day or two prior to your surgery date and they will give you your arrival time.

## 2018-08-08 ENCOUNTER — Telehealth: Payer: Self-pay | Admitting: *Deleted

## 2018-08-08 NOTE — Telephone Encounter (Addendum)
DOS 08/16/2018 CPT CODE 24175 RIGHT FOOT  [employer]  Effective - Term Dates 04/18/2018 - 04/18/2019   You are not required to submit a notification/prior authorization based on the information provided. The number above acknowledges your inquiry and our response. Please write this number down and refer to it for future inquiries.   Decision ID #:[id number]  Individual In-Network (Calendar Year) Deductible Deductible has been met  $0.00  remaining  $198.00  Plan Amt.  Out-of-Pocket $357.51 YTD  Member's Individual Out-of-Pocket Maximum has no limit.  Ambulatory Surgery: 20% coinsurance for Medicare covered surgery or each day of observation provided to you at an ambulatory surgical center, including but not limited to hospital or other facility charges and physician or surgical charges.

## 2018-08-10 DIAGNOSIS — M79676 Pain in unspecified toe(s): Secondary | ICD-10-CM

## 2018-08-16 ENCOUNTER — Telehealth: Payer: Self-pay | Admitting: Podiatry

## 2018-08-16 ENCOUNTER — Other Ambulatory Visit: Payer: Self-pay | Admitting: Podiatry

## 2018-08-16 DIAGNOSIS — M722 Plantar fascial fibromatosis: Secondary | ICD-10-CM | POA: Diagnosis not present

## 2018-08-16 MED ORDER — OXYCODONE-ACETAMINOPHEN 5-325 MG PO TABS: 1.0000 | ORAL_TABLET | Freq: Four times a day (QID) | ORAL | 0 refills | Status: DC | PRN

## 2018-08-16 NOTE — Progress Notes (Signed)
.  postop

## 2018-08-16 NOTE — Telephone Encounter (Signed)
Patient had surgery today and want to know if she needs to sleep with her boot on.

## 2018-08-16 NOTE — Telephone Encounter (Signed)
I informed pt she should sleep in the boot until directed otherwise by Dr. Amalia Hailey, and if she was resting and not going to sleep she could open the boot and rotate the foot around.

## 2018-08-20 ENCOUNTER — Ambulatory Visit (INDEPENDENT_AMBULATORY_CARE_PROVIDER_SITE_OTHER): Payer: Self-pay | Admitting: Podiatry

## 2018-08-20 ENCOUNTER — Encounter: Payer: Self-pay | Admitting: Podiatry

## 2018-08-20 ENCOUNTER — Other Ambulatory Visit: Payer: Self-pay

## 2018-08-20 VITALS — Temp 98.1°F

## 2018-08-20 DIAGNOSIS — M722 Plantar fascial fibromatosis: Secondary | ICD-10-CM

## 2018-08-20 DIAGNOSIS — Z9889 Other specified postprocedural states: Secondary | ICD-10-CM

## 2018-08-20 NOTE — Progress Notes (Signed)
   Subjective:  Patient presents today status post EPF right foot. DOS: 08/16/2018.  Patient states that her foot is painful but she cannot take the oral pain medication due to anxiety.  She has been taking ibuprofen as needed for pain.  She has been weightbearing in a immobilization cam walker as directed and dressings have been clean dry and intact  Past Medical History:  Diagnosis Date  . Asthma   . Atrophic vaginitis 2009  . BV (bacterial vaginosis) 2005  . Candida vaginitis 2006  . Candida vaginitis 2009  . Depression   . Dysuria 2007  . Female pelvic pain 2006  . Fibroid 2005  . GERD (gastroesophageal reflux disease)   . H/O constipation 2009  . H/O dyspareunia 2008  . H/O urinary frequency 2011  . H/O vaginitis 2005  . Heart murmur   . History of bacterial infection   . History of hemorrhoids 2009  . Hx: UTI (urinary tract infection) 2007  . Hyperlipidemia   . Hypertension   . Irregular periods/menstrual cycles 2007  . Libido, decreased 2006  . Menopausal symptoms 2007  . Nocturia 2009  . Proteinuria 2009  . Sickle cell trait ([place])   . Tenderness of female pelvic organs 2005  . Ulcer   . Vaginal irritation 2011      Objective/Physical Exam Neurovascular status intact.  Skin incisions appear to be well coapted with sutures intact. No sign of infectious process noted. No dehiscence. No active bleeding noted. Moderate edema noted to the surgical extremity.  Assessment: 1. s/p EPF right foot. DOS: 08/16/2018   Plan of Care:  1. Patient was evaluated. 2.  Postoperative shoe dispensed.  Discontinue cam walker.  Weightbearing as tolerated 3.  Recommend antibiotic ointment a Band-Aid daily. 4.  Return to clinic in 1 week for suture removal  Edrick Kins, DPM [facility]  Dr. Edrick Kins, [address]                                        [place], [place] [address]                Office [phone]  Fax [phone]

## 2018-08-23 ENCOUNTER — Encounter: Payer: Self-pay | Admitting: Podiatry

## 2018-08-24 ENCOUNTER — Telehealth: Payer: Self-pay | Admitting: Podiatry

## 2018-08-24 NOTE — Telephone Encounter (Signed)
Patient called requesting a refill on her pain medication. Pt states that she tried tylenol but it didn't seem to help

## 2018-08-24 NOTE — Telephone Encounter (Signed)
I called pt and she states she is still having aching in the heel and foot and can't take ibuprofen due to "the acid in it". I told pt to continue to ice and elevate the foot and decrease activity. I offered a refill of the tramadol and pt states "it doesn't work that good." I told pt I would inform Dr. Amalia Hailey and to check with her pharmacy later today.

## 2018-08-27 ENCOUNTER — Telehealth: Payer: Self-pay | Admitting: Podiatry

## 2018-08-27 ENCOUNTER — Telehealth: Payer: Self-pay | Admitting: *Deleted

## 2018-08-27 MED ORDER — TRAMADOL HCL 50 MG PO TABS: 50.0000 mg | ORAL_TABLET | Freq: Three times a day (TID) | ORAL | 0 refills | Status: DC | PRN

## 2018-08-27 NOTE — Telephone Encounter (Signed)
I told pt that Dr. Amalia Hailey said that at this point in her recovery tramadol should be taking care of her pain and if it was not she should make an appt to be seen in office. Pt states okay she only has two left and will take a refill of the tramadol.

## 2018-08-27 NOTE — Telephone Encounter (Signed)
Left message with refill orders for tramadol.

## 2018-08-27 NOTE — Telephone Encounter (Signed)
Pt is in pain, she is requesting stronger meds please call patient

## 2018-08-27 NOTE — Telephone Encounter (Signed)
I called pt she states she has throbs and bee stinging in the heel, she is putting the ointment on the surgery areas and a bandaid. Pt states she is still icing and wearing the boot.

## 2018-08-27 NOTE — Telephone Encounter (Signed)
Okay to send Tramadol

## 2018-08-27 NOTE — Telephone Encounter (Signed)
Okay to prescribe Tramadol 50mg  #30 q6h prn pain. Thanks, Dr. Amalia Hailey

## 2018-08-27 NOTE — Telephone Encounter (Signed)
Pt states she needs something for the pain, really bad aching.

## 2018-08-27 NOTE — Telephone Encounter (Signed)
This message was directed to Dr. Amalia Hailey in another Telephone Call message.

## 2018-08-29 ENCOUNTER — Other Ambulatory Visit: Payer: Self-pay

## 2018-08-29 ENCOUNTER — Ambulatory Visit (INDEPENDENT_AMBULATORY_CARE_PROVIDER_SITE_OTHER): Payer: 59 | Admitting: Podiatry

## 2018-08-29 VITALS — Temp 98.1°F

## 2018-08-29 DIAGNOSIS — M722 Plantar fascial fibromatosis: Secondary | ICD-10-CM | POA: Diagnosis not present

## 2018-08-29 DIAGNOSIS — Z9889 Other specified postprocedural states: Secondary | ICD-10-CM

## 2018-08-30 ENCOUNTER — Telehealth (INDEPENDENT_AMBULATORY_CARE_PROVIDER_SITE_OTHER): Payer: 59 | Admitting: Family Medicine

## 2018-08-30 ENCOUNTER — Other Ambulatory Visit: Payer: Self-pay

## 2018-08-30 DIAGNOSIS — F418 Other specified anxiety disorders: Secondary | ICD-10-CM | POA: Diagnosis not present

## 2018-08-30 DIAGNOSIS — E785 Hyperlipidemia, unspecified: Secondary | ICD-10-CM

## 2018-08-30 DIAGNOSIS — K219 Gastro-esophageal reflux disease without esophagitis: Secondary | ICD-10-CM

## 2018-08-30 DIAGNOSIS — I10 Essential (primary) hypertension: Secondary | ICD-10-CM

## 2018-08-30 NOTE — Assessment & Plan Note (Signed)
Due for lipids, but in light of COVID pandemic will postpone these another 3 months or so to minimize risk to patient of coming into medical facility. We will touch base for another visit in 3 months, at which time we can decide about getting labs.

## 2018-08-30 NOTE — Progress Notes (Signed)
[place] Telemedicine Visit  Patient consented to have virtual visit. Method of visit: Video via doximity  Encounter participants: Patient: Regina Sawyer - located at home Provider: Chrisandra Netters - located at office Others (if applicable): n/a  Chief Complaint: routine follow up   HPI:  GERD - taking dexilant 30mg  daily. Symptoms are well controlled with this medication. Takes before she eats breakfast.  Mood - currently on remeron 15mg  at bedtime and lexapro 20mg  daily. Mood is doing well. She is coping well despite COVID pandemic. Is careful with social distancing, wears masks and gloves when she goes to the store. No SI/HI. Sleeping well at night. Has not weighed herself lately but states her clothes fit like normal.  Hypertension - currently taking lisinopril 67m gdaily and carvedilol 6.25mg  twice daily. Tolerating these well. No chest pain or shortness of breath. Has blood pressure monitor at home but has not checked blood pressure lately.   Hyperlipidemia - taking rosuvastatin 20mg  daily, tolerating well without issues  ROS: per HPI  Pertinent PMHx: history of hyperlipidemia, hypertension, GERD, anxiety  Exam:  Respiratory: Patient speaking normally in full sentences throughout the encounter, without any respiratory distress evident.   Assessment/Plan:  Anxiety disorder, unspecified Well controlled. Continue current regimen.   Hyperlipidemia Due for lipids, but in light of COVID pandemic will postpone these another 3 months or so to minimize risk to patient of coming into medical facility. We will touch base for another visit in 3 months, at which time we can decide about getting labs.  GASTROESOPHAGEAL REFLUX, NO ESOPHAGITIS Well controlled. Continue current regimen.   HYPERTENSION, BENIGN ESSENTIAL Compliant with blood pressure medications. Encouraged patient to check blood pressure at home at least once a week, advised goal blood  pressure is <140/90 and asked her to call us if she is consistently getting above those numbers. Follow up in 3 months.    Time spent during visit with patient: 10 minutes

## 2018-08-30 NOTE — Assessment & Plan Note (Signed)
Well-controlled.  Continue current regimen. 

## 2018-08-30 NOTE — Assessment & Plan Note (Addendum)
Compliant with blood pressure medications. Encouraged patient to check blood pressure at home at least once a week, advised goal blood pressure is <140/90 and asked her to call us if she is consistently getting above those numbers. Follow up in 3 months.

## 2018-09-02 NOTE — Progress Notes (Signed)
   Subjective:  Patient presents today status post EPF right foot. DOS: 08/16/2018. She reports some intermittent aching of the foot. She has been using the post op shoe as directed. There are no modifying factors noted. Patient is here for further evaluation and treatment.   Past Medical History:  Diagnosis Date  . Asthma   . Atrophic vaginitis 2009  . BV (bacterial vaginosis) 2005  . Candida vaginitis 2006  . Candida vaginitis 2009  . Depression   . Dysuria 2007  . Female pelvic pain 2006  . Fibroid 2005  . GERD (gastroesophageal reflux disease)   . H/O constipation 2009  . H/O dyspareunia 2008  . H/O urinary frequency 2011  . H/O vaginitis 2005  . Heart murmur   . History of bacterial infection   . History of hemorrhoids 2009  . Hx: UTI (urinary tract infection) 2007  . Hyperlipidemia   . Hypertension   . Irregular periods/menstrual cycles 2007  . Libido, decreased 2006  . Menopausal symptoms 2007  . Nocturia 2009  . Proteinuria 2009  . Sickle cell trait ([place])   . Tenderness of female pelvic organs 2005  . Ulcer   . Vaginal irritation 2011      Objective/Physical Exam Neurovascular status intact.  Skin incisions appear to be well coapted with sutures intact. No sign of infectious process noted. No dehiscence. No active bleeding noted. Moderate edema noted to the surgical extremity.  Assessment: 1. s/p EPF right foot. DOS: 08/16/2018   Plan of Care:  1. Patient was evaluated. 2. Sutures removed.  3. Discontinue using post op shoe.  4. Recommended good sneakers.  5. Return to clinic in 4 weeks.   Edrick Kins, DPM [facility]  Dr. Edrick Kins, [address]                                        [place], [place] [address]                Office [phone]  Fax [phone]

## 2018-09-11 ENCOUNTER — Other Ambulatory Visit: Payer: Self-pay | Admitting: Family Medicine

## 2018-09-17 ENCOUNTER — Ambulatory Visit (INDEPENDENT_AMBULATORY_CARE_PROVIDER_SITE_OTHER): Payer: 59 | Admitting: Podiatry

## 2018-09-17 ENCOUNTER — Encounter: Payer: Self-pay | Admitting: Podiatry

## 2018-09-17 ENCOUNTER — Other Ambulatory Visit: Payer: Self-pay | Admitting: Gastroenterology

## 2018-09-17 ENCOUNTER — Other Ambulatory Visit: Payer: Self-pay

## 2018-09-17 VITALS — Temp 97.7°F

## 2018-09-17 DIAGNOSIS — Z9889 Other specified postprocedural states: Secondary | ICD-10-CM

## 2018-09-17 DIAGNOSIS — M722 Plantar fascial fibromatosis: Secondary | ICD-10-CM

## 2018-09-19 ENCOUNTER — Encounter: Payer: 59 | Admitting: Podiatry

## 2018-09-19 NOTE — Progress Notes (Signed)
   Subjective:  Patient presents today status post EPF right foot. DOS: 08/16/2018. She states she is doing well. She reports continued intermittent pain. There are no modifying factors noted. Patient is here for further evaluation and treatment.   Past Medical History:  Diagnosis Date  . Asthma   . Atrophic vaginitis 2009  . BV (bacterial vaginosis) 2005  . Candida vaginitis 2006  . Candida vaginitis 2009  . Depression   . Dysuria 2007  . Female pelvic pain 2006  . Fibroid 2005  . GERD (gastroesophageal reflux disease)   . H/O constipation 2009  . H/O dyspareunia 2008  . H/O urinary frequency 2011  . H/O vaginitis 2005  . Heart murmur   . History of bacterial infection   . History of hemorrhoids 2009  . Hx: UTI (urinary tract infection) 2007  . Hyperlipidemia   . Hypertension   . Irregular periods/menstrual cycles 2007  . Libido, decreased 2006  . Menopausal symptoms 2007  . Nocturia 2009  . Proteinuria 2009  . Sickle cell trait ([place])   . Tenderness of female pelvic organs 2005  . Ulcer   . Vaginal irritation 2011      Objective/Physical Exam Neurovascular status intact.  Skin incisions appear to be well coapted. No sign of infectious process noted. No dehiscence. No active bleeding noted. Moderate edema noted to the surgical extremity.  Assessment: 1. s/p EPF right foot. DOS: 08/16/2018   Plan of Care:  1. Patient was evaluated. 2. Recommended good shoe gear.  3. May return to work full activity with no restrictions.  4. Return to clinic as needed.   Edrick Kins, DPM [facility]  Dr. Edrick Kins, [address]                                        [place], [place] [address]                Office [phone]  Fax [phone]

## 2018-09-20 ENCOUNTER — Other Ambulatory Visit: Payer: Self-pay | Admitting: Family Medicine

## 2018-10-03 ENCOUNTER — Telehealth: Payer: Self-pay | Admitting: Podiatry

## 2018-10-03 NOTE — Telephone Encounter (Signed)
Pt called stating that the Tramadol she was prescribed is not helping with her pain and would like to know if the doctor can prescribe a pain medication for her. Pt is scheduled for an appt on 6/24.

## 2018-10-03 NOTE — Telephone Encounter (Signed)
Left message informing pt I had sent message to Dr. Amalia Hailey concerning the request for pain medication, that Dr. Amalia Hailey may want her to continue with the tramadol at this stage in her surgery recovery, rest, ice and elevate, but she could check with the pharmacy.

## 2018-10-03 NOTE — Telephone Encounter (Signed)
Pt has called back about her pain medication. Please call back

## 2018-10-04 NOTE — Telephone Encounter (Signed)
Patient should continue with Tramadol. Patient is 6 weeks status post EPF. I will re-evaluate on 6/24 when she comes into the office.  - Dr. Amalia Hailey

## 2018-10-10 ENCOUNTER — Other Ambulatory Visit: Payer: Self-pay | Admitting: Podiatry

## 2018-10-10 ENCOUNTER — Encounter: Payer: Self-pay | Admitting: Podiatry

## 2018-10-10 ENCOUNTER — Ambulatory Visit (INDEPENDENT_AMBULATORY_CARE_PROVIDER_SITE_OTHER): Payer: 59

## 2018-10-10 ENCOUNTER — Ambulatory Visit (INDEPENDENT_AMBULATORY_CARE_PROVIDER_SITE_OTHER): Payer: 59 | Admitting: Podiatry

## 2018-10-10 ENCOUNTER — Other Ambulatory Visit: Payer: Self-pay

## 2018-10-10 DIAGNOSIS — M79671 Pain in right foot: Secondary | ICD-10-CM

## 2018-10-10 DIAGNOSIS — M722 Plantar fascial fibromatosis: Secondary | ICD-10-CM

## 2018-10-10 DIAGNOSIS — Z9889 Other specified postprocedural states: Secondary | ICD-10-CM

## 2018-10-10 DIAGNOSIS — M659 Synovitis and tenosynovitis, unspecified: Secondary | ICD-10-CM

## 2018-10-10 MED ORDER — METHYLPREDNISOLONE 4 MG PO TBPK: ORAL_TABLET | ORAL | 0 refills | Status: DC

## 2018-10-13 NOTE — Progress Notes (Signed)
   Subjective:  Patient presents today status post EPF right foot. DOS: 08/16/2018. She states the foot started swelling about three days ago. She reports associated throbbing pain that wakes her from her sleep sometime. There are no modifying factors noted. She has not done anything for treatment. Patient is here for further evaluation and treatment.   Past Medical History:  Diagnosis Date  . Asthma   . Atrophic vaginitis 2009  . BV (bacterial vaginosis) 2005  . Candida vaginitis 2006  . Candida vaginitis 2009  . Depression   . Dysuria 2007  . Female pelvic pain 2006  . Fibroid 2005  . GERD (gastroesophageal reflux disease)   . H/O constipation 2009  . H/O dyspareunia 2008  . H/O urinary frequency 2011  . H/O vaginitis 2005  . Heart murmur   . History of bacterial infection   . History of hemorrhoids 2009  . Hx: UTI (urinary tract infection) 2007  . Hyperlipidemia   . Hypertension   . Irregular periods/menstrual cycles 2007  . Libido, decreased 2006  . Menopausal symptoms 2007  . Nocturia 2009  . Proteinuria 2009  . Sickle cell trait ([place])   . Tenderness of female pelvic organs 2005  . Ulcer   . Vaginal irritation 2011      Objective/Physical Exam Neurovascular status intact.  Skin incisions appear to be well coapted. No sign of infectious process noted. No dehiscence. No active bleeding noted. Moderate edema noted to the surgical extremity. Pain with palpation noted to the anterior, lateral and medial aspects of the right ankle joint.   Radiographic Exam:  Osteotomies sites appear to be stable with routine healing.  Assessment: 1. s/p EPF right foot. DOS: 08/16/2018 2. Right ankle synovitis    Plan of Care:  1. Patient was evaluated. X-Rays reviewed.  2. Declined injections.  3. Prescription for Medrol Dose Pak provided to patient. 4. Powerstep insoles provided to patient.  5. Return to clinic as needed.   Edrick Kins, DPM [facility]  Dr.  Edrick Kins, [address]                                        [place], [place] [address]                Office [phone]  Fax [phone]

## 2018-10-22 ENCOUNTER — Other Ambulatory Visit: Payer: Self-pay | Admitting: Family Medicine

## 2018-11-21 ENCOUNTER — Encounter: Payer: Self-pay | Admitting: Podiatry

## 2018-11-21 ENCOUNTER — Other Ambulatory Visit: Payer: Self-pay | Admitting: Gastroenterology

## 2018-11-21 ENCOUNTER — Other Ambulatory Visit: Payer: Self-pay

## 2018-11-21 ENCOUNTER — Other Ambulatory Visit: Payer: Self-pay | Admitting: Family Medicine

## 2018-11-21 ENCOUNTER — Ambulatory Visit (INDEPENDENT_AMBULATORY_CARE_PROVIDER_SITE_OTHER): Payer: 59 | Admitting: Podiatry

## 2018-11-21 VITALS — Temp 97.7°F

## 2018-11-21 DIAGNOSIS — M722 Plantar fascial fibromatosis: Secondary | ICD-10-CM | POA: Diagnosis not present

## 2018-11-21 DIAGNOSIS — M79671 Pain in right foot: Secondary | ICD-10-CM | POA: Diagnosis not present

## 2018-11-21 MED ORDER — MELOXICAM 15 MG PO TABS: 15.0000 mg | ORAL_TABLET | Freq: Every day | ORAL | 1 refills | Status: DC

## 2018-11-21 MED ORDER — DICLOFENAC SODIUM 1 % TD GEL: 4.0000 g | Freq: Four times a day (QID) | TRANSDERMAL | 2 refills | Status: DC

## 2018-11-24 NOTE — Progress Notes (Signed)
   Subjective:  Patient presents today status post EPF right foot. DOS: 08/16/2018. She states she is doing well overall. She reports some soreness at the incision site. She reports a nodule to the right heel. Standing for long periods of time increases the pain. She has been using the insoles which help alleviate her symptoms. Patient is here for further evaluation and treatment.   Past Medical History:  Diagnosis Date  . Asthma   . Atrophic vaginitis 2009  . BV (bacterial vaginosis) 2005  . Candida vaginitis 2006  . Candida vaginitis 2009  . Depression   . Dysuria 2007  . Female pelvic pain 2006  . Fibroid 2005  . GERD (gastroesophageal reflux disease)   . H/O constipation 2009  . H/O dyspareunia 2008  . H/O urinary frequency 2011  . H/O vaginitis 2005  . Heart murmur   . History of bacterial infection   . History of hemorrhoids 2009  . Hx: UTI (urinary tract infection) 2007  . Hyperlipidemia   . Hypertension   . Irregular periods/menstrual cycles 2007  . Libido, decreased 2006  . Menopausal symptoms 2007  . Nocturia 2009  . Proteinuria 2009  . Sickle cell trait ([place])   . Tenderness of female pelvic organs 2005  . Ulcer   . Vaginal irritation 2011      Objective/Physical Exam Neurovascular status intact.  Skin incisions appear to be well coapted. No sign of infectious process noted. No dehiscence. No active bleeding noted. Moderate edema noted to the surgical extremity. Pain with palpation noted to the anterior, lateral and medial aspects of the right ankle joint.  Pain with palpation noted to the right heel along the plantar fascia.   Assessment: 1. s/p EPF right foot. DOS: 08/16/2018 2. Right ankle synovitis  3. Plantar fasciitis right    Plan of Care:  1. Patient was evaluated.   2. Declined injections.  3. Prescription for Meloxicam 15 mg #30 provided to patient. 4. Prescription for Voltaren Gel 2% provided to patient.  5. Recommended good shoe gear.  6.  Compression anklet dispensed.  7. Return to clinic as needed.   Edrick Kins, DPM [facility]  Dr. Edrick Kins, [address]                                        [place], [place] [address]                Office [phone]  Fax [phone]

## 2018-11-30 ENCOUNTER — Other Ambulatory Visit: Payer: Self-pay | Admitting: Gastroenterology

## 2018-11-30 ENCOUNTER — Telehealth: Payer: Self-pay | Admitting: Family Medicine

## 2018-11-30 NOTE — Telephone Encounter (Signed)
Red team, please ask patient to schedule follow up with me. She is due for labwork so a morning fasting appointment is ideal Thanks Leeanne Rio, MD

## 2018-12-03 NOTE — Telephone Encounter (Signed)
Patient informed that she needs to make an appointment for labwork per Dr. Ardelia Mems. Patient informed that she needs to fast prior to appointment as well.  Patient states that she will call back and make appointment later.  Regina Sawyer, [place]

## 2018-12-04 ENCOUNTER — Other Ambulatory Visit: Payer: Self-pay | Admitting: Family Medicine

## 2018-12-04 ENCOUNTER — Other Ambulatory Visit: Payer: Self-pay

## 2018-12-04 ENCOUNTER — Other Ambulatory Visit: Payer: 59

## 2018-12-04 DIAGNOSIS — E785 Hyperlipidemia, unspecified: Secondary | ICD-10-CM

## 2018-12-05 ENCOUNTER — Encounter: Payer: Self-pay | Admitting: Family Medicine

## 2018-12-05 LAB — LIPID PANEL
Chol/HDL Ratio: 2.6 ratio (ref 0.0–4.4)
Cholesterol, Total: 135 mg/dL (ref 100–199)
HDL: 52 mg/dL (ref >39)
LDL Calculated: 64 mg/dL (ref 0–99)
Triglycerides: 96 mg/dL (ref 0–149)
VLDL Cholesterol Cal: 19 mg/dL (ref 5–40)

## 2018-12-05 LAB — CMP14+EGFR
ALT: 13 IU/L (ref 0–32)
AST: 19 IU/L (ref 0–40)
Albumin/Globulin Ratio: 1.4 (ref 1.2–2.2)
Albumin: 4.4 g/dL (ref 3.8–4.9)
Alkaline Phosphatase: 96 IU/L (ref 39–117)
BUN/Creatinine Ratio: 14 (ref 12–28)
BUN: 15 mg/dL (ref 8–27)
Bilirubin Total: 0.2 mg/dL (ref 0.0–1.2)
CO2: 24 mmol/L (ref 20–29)
Calcium: 9.5 mg/dL (ref 8.7–10.3)
Chloride: 104 mmol/L (ref 96–106)
Creatinine, Ser: 1.09 mg/dL — ABNORMAL HIGH (ref 0.57–1.00)
GFR calc Af Amer: 64 mL/min/{1.73_m2} (ref >59)
GFR calc non Af Amer: 55 mL/min/{1.73_m2} — ABNORMAL LOW (ref >59)
Globulin, Total: 3.2 g/dL (ref 1.5–4.5)
Glucose: 91 mg/dL (ref 65–99)
Potassium: 4.7 mmol/L (ref 3.5–5.2)
Sodium: 142 mmol/L (ref 134–144)
Total Protein: 7.6 g/dL (ref 6.0–8.5)

## 2018-12-22 ENCOUNTER — Other Ambulatory Visit: Payer: Self-pay | Admitting: Family Medicine

## 2018-12-25 ENCOUNTER — Telehealth (INDEPENDENT_AMBULATORY_CARE_PROVIDER_SITE_OTHER): Payer: 59 | Admitting: Family Medicine

## 2018-12-25 ENCOUNTER — Other Ambulatory Visit: Payer: Self-pay

## 2018-12-25 DIAGNOSIS — E785 Hyperlipidemia, unspecified: Secondary | ICD-10-CM

## 2018-12-25 DIAGNOSIS — I10 Essential (primary) hypertension: Secondary | ICD-10-CM

## 2018-12-25 DIAGNOSIS — K219 Gastro-esophageal reflux disease without esophagitis: Secondary | ICD-10-CM

## 2018-12-25 DIAGNOSIS — F418 Other specified anxiety disorders: Secondary | ICD-10-CM

## 2018-12-25 NOTE — Progress Notes (Signed)
[place] Telemedicine Visit  Patient consented to have virtual visit. Method of visit: Telephone  Encounter participants: Patient: Claresa Canale - located at home Provider: Chrisandra Netters - located at office Others (if applicable): n/a  Chief Complaint: follow up  HPI:  Karlena presents for routine follow up via virtual visit.  Hypertension - currently taking lisinopril 20mg  daily. Feels well. Checks blood pressure at home but is not sure of readings but says blood pressure has not been high. Denies dizziness, chest pain, shortness of breath.   Anxiety - taking lexapro as well as remeron 15mg  at night. No SI/HI. Mood is good overall. Clothes fitting normally, no excessive weight gain.  GERD - doing well on dexilant 30mg  daily. No bothersome reflux symptoms.   Declines flu shot for this entire season  ROS: per HPI  Pertinent PMHx: history of anxiety, hypertension, gerd, hyperlipidemia   Exam:  Respiratory: Patient speaking normally in full sentences throughout the encounter, without any respiratory distress evident.   Assessment/Plan:  Hyperlipidemia Well controlled on recent lipids, continue current management  HYPERTENSION, BENIGN ESSENTIAL Appears well controlled at home, continue current regimen  GASTROESOPHAGEAL REFLUX, NO ESOPHAGITIS Well controlled. Continue current regimen.   Anxiety disorder, unspecified Doing well on lexapro and remeron. Continue these medications.    Time spent during visit with patient: 5 minutes

## 2018-12-28 NOTE — Assessment & Plan Note (Signed)
Appears well controlled at home, continue current regimen

## 2018-12-28 NOTE — Assessment & Plan Note (Signed)
Well-controlled.  Continue current regimen. 

## 2018-12-28 NOTE — Assessment & Plan Note (Signed)
Doing well on lexapro and remeron. Continue these medications.

## 2018-12-28 NOTE — Assessment & Plan Note (Signed)
Well controlled on recent lipids, continue current management

## 2018-12-31 ENCOUNTER — Other Ambulatory Visit: Payer: Self-pay | Admitting: Family Medicine

## 2019-01-02 ENCOUNTER — Ambulatory Visit: Payer: 59 | Admitting: Podiatry

## 2019-01-03 ENCOUNTER — Ambulatory Visit: Payer: 59 | Admitting: Gastroenterology

## 2019-01-07 ENCOUNTER — Ambulatory Visit (INDEPENDENT_AMBULATORY_CARE_PROVIDER_SITE_OTHER): Payer: 59 | Admitting: Gastroenterology

## 2019-01-07 ENCOUNTER — Encounter: Payer: Self-pay | Admitting: Gastroenterology

## 2019-01-07 VITALS — BP 160/70 | HR 80 | Temp 97.8°F | Ht 63.0 in | Wt 175.0 lb

## 2019-01-07 DIAGNOSIS — K219 Gastro-esophageal reflux disease without esophagitis: Secondary | ICD-10-CM | POA: Diagnosis not present

## 2019-01-07 NOTE — Progress Notes (Signed)
HPI :  63 y/o female here for a follow up visit. She has been followed by our practice for GERD and history of colon polyps.  She had an EGD in 2016 with no evidence of Barrett's esophagus, and benign gastric polyps. She has been maintained with Dexilant over the past few years with good control of symptoms. At our last visit in Jan 2019 we were able to reduce her dose from 60mg  / day to 30mg  / day and she has been on that dose since that time. Overall her symptoms are very well controlled on this regimen. She denies any breakthrough pyrosis or regurgitation. She denies any dysphagia. She is quite happy with the regimen. She is eating well. No nausea or vomiting. No abdominal pains. No issues with her bowels that bother her. She thinks she has been on other regimens in the past but cannot recall which ones and what she failed.   Prior workup EGD 07/2014 - benign gastric polyps, otherwise normal Colonoscopy - 10/2014 - 2 small adenomas CT 07/2014 - was unremarkable except for uterine fibroids  Gastric emptying scan 5/16 - normal   Past Medical History:  Diagnosis Date  . Asthma   . Atrophic vaginitis 2009  . BV (bacterial vaginosis) 2005  . Candida vaginitis 2006  . Candida vaginitis 2009  . Depression   . Dysuria 2007  . Female pelvic pain 2006  . Fibroid 2005  . GERD (gastroesophageal reflux disease)   . H/O constipation 2009  . H/O dyspareunia 2008  . H/O urinary frequency 2011  . H/O vaginitis 2005  . Heart murmur   . History of bacterial infection   . History of hemorrhoids 2009  . Hx: UTI (urinary tract infection) 2007  . Hyperlipidemia   . Hypertension   . Irregular periods/menstrual cycles 2007  . Libido, decreased 2006  . Menopausal symptoms 2007  . Nocturia 2009  . Proteinuria 2009  . Sickle cell trait ([place])   . Tenderness of female pelvic organs 2005  . Ulcer   . Vaginal irritation 2011     Past Surgical History:  Procedure Laterality Date  . CARPAL TUNNEL  RELEASE     both wrists  . COLONOSCOPY N/A 10/28/2014   Procedure: COLONOSCOPY;  Surgeon: Inda Castle, MD;  Location: [facility];  Service: Endoscopy;  Laterality: N/A;  . ESOPHAGOGASTRODUODENOSCOPY N/A 08/06/2014   Procedure: ESOPHAGOGASTRODUODENOSCOPY (EGD);  Surgeon: Inda Castle, MD;  Location: [facility];  Service: Endoscopy;  Laterality: N/A;  . TUBAL LIGATION     Family History  Problem Relation Age of Onset  . Diabetes Sister   . Hypertension Sister   . Colon cancer Father   . Heart failure Father   . Heart failure Brother        x2  . Gallstones Mother   . Colon polyps Neg Hx   . Kidney disease Neg Hx   . Esophageal cancer Neg Hx   . Gallbladder disease Neg Hx    Social History   Tobacco Use  . Smoking status: Passive Smoke Exposure - Never Smoker  . Smokeless tobacco: Never Used  Substance Use Topics  . Alcohol use: No  . Drug use: No   Current Outpatient Medications  Medication Sig Dispense Refill  . carvedilol (COREG) 6.25 MG tablet TAKE 1 TABLET (6.25 MG TOTAL) BY MOUTH 2 (TWO) TIMES DAILY WITH A MEAL. 180 tablet 1  . Dexlansoprazole (DEXILANT) 30 MG capsule Take 1 capsule (30 mg total) by  mouth daily. 30 capsule 0  . escitalopram (LEXAPRO) 20 MG tablet TAKE 1 TABLET BY MOUTH EVERY DAY 90 tablet 1  . lisinopril (ZESTRIL) 20 MG tablet TAKE 1 TABLET BY MOUTH EVERY DAY 90 tablet 1  . mirtazapine (REMERON) 15 MG tablet TAKE 1 TABLET BY MOUTH EVERYDAY AT BEDTIME 90 tablet 0  . rosuvastatin (CRESTOR) 20 MG tablet TAKE 1 TABLET BY MOUTH EVERY DAY 90 tablet 0   No current facility-administered medications for this visit.    Allergies  Allergen Reactions  . Lipitor [Atorvastatin] Other (See Comments)    Abdominal pain, severe nausea, muscle / body aches  . Lactose   . Latex Hives, Itching and Rash     Review of Systems: All systems reviewed and negative except where noted in HPI.   Lab Results  Component Value Date   WBC 4.0 07/23/2014   HGB 11.9  (L) 04/19/2016   HCT 35.0 (L) 04/19/2016   MCV 85.4 07/23/2014   PLT 380 07/23/2014    Lab Results  Component Value Date   CREATININE 1.09 (H) 12/04/2018   BUN 15 12/04/2018   NA 142 12/04/2018   K 4.7 12/04/2018   CL 104 12/04/2018   CO2 24 12/04/2018    Lab Results  Component Value Date   ALT 13 12/04/2018   AST 19 12/04/2018   ALKPHOS 96 12/04/2018   BILITOT 0.2 12/04/2018      Physical Exam: BP (!) 160/70   Pulse 80   Temp 97.8 F (36.6 C)   Ht 5\' 3"  (1.6 m)   Wt 175 lb (79.4 kg)   BMI 31.00 kg/m  Constitutional: Pleasant,well-developed, female in no acute distress. HEENT: Normocephalic and atraumatic. Conjunctivae are normal. No scleral icterus. Neck supple.  Cardiovascular: Normal rate, regular rhythm.  Pulmonary/chest: Effort normal and breath sounds normal. No wheezing, rales or rhonchi. Abdominal: Soft, nondistended, nontender. Bowel sounds active throughout. There are no masses palpable. No hepatomegaly. Extremities: no edema Lymphadenopathy: No cervical adenopathy noted. Neurological: Alert and oriented to person place and time. Skin: Skin is warm and dry. No rashes noted. Psychiatric: Normal mood and affect. Behavior is normal.   ASSESSMENT AND PLAN: 63 y/o female here for reassessment of the following issues:  GERD - longstanding symptoms, no Barrett's. On Dexilant symptoms well controlled. We were able to dose reduce her Dexilant and maintain good control of symptoms at our last visit.  I discussed long term PPI use, risks / benefits, with her. Recommended lowest dose needed to control symptoms or use of an alternative. She can't recall what she failed in the past to be maintained on Dexilant. Discussed options with her. She wants to try and wean off if possible. Recommend she reduce Dexilant to every other day dosing for the next 4 weeks and see how she does. If she does well with this, may stop it and transition her to Pepcid PRN or daily. If  however, she does not do well with the taper then she should increase back to daily use. I asked her to touch base with me in 1 month after she has tried doing every other day dosing and update me on how she is doing and I can make further recommendations at that time. She agreed.   Cavalero Cellar, MD [facility]

## 2019-01-08 LAB — RESULTS CONSOLE HPV: CHL HPV: NEGATIVE

## 2019-01-08 LAB — HM PAP SMEAR

## 2019-01-14 ENCOUNTER — Ambulatory Visit: Payer: 59 | Admitting: Podiatry

## 2019-01-16 ENCOUNTER — Other Ambulatory Visit: Payer: Self-pay

## 2019-01-16 ENCOUNTER — Ambulatory Visit (INDEPENDENT_AMBULATORY_CARE_PROVIDER_SITE_OTHER): Payer: 59 | Admitting: Podiatry

## 2019-01-16 DIAGNOSIS — M722 Plantar fascial fibromatosis: Secondary | ICD-10-CM

## 2019-01-16 MED ORDER — TRAMADOL HCL 50 MG PO TABS: 50.0000 mg | ORAL_TABLET | Freq: Four times a day (QID) | ORAL | 0 refills | Status: DC | PRN

## 2019-01-16 NOTE — Progress Notes (Signed)
   HPI: 63 y.o. female presenting today for follow-up evaluation regarding some pain and tenderness to the medial aspect of the right foot.  Patient underwent EPF surgery on the right foot on 08/16/2018.  Now she has a palpable mass just distal to the small incision site to the medial aspect of the right heel.  It is very tender to palpation is been present for several months now.  She is not taking meloxicam and she has been wearing the compression ankle sleeve.  No new complaints at this time  Past Medical History:  Diagnosis Date  . Asthma   . Atrophic vaginitis 2009  . BV (bacterial vaginosis) 2005  . Candida vaginitis 2006  . Candida vaginitis 2009  . Depression   . Dysuria 2007  . Female pelvic pain 2006  . Fibroid 2005  . GERD (gastroesophageal reflux disease)   . H/O constipation 2009  . H/O dyspareunia 2008  . H/O urinary frequency 2011  . H/O vaginitis 2005  . Heart murmur   . History of bacterial infection   . History of hemorrhoids 2009  . Hx: UTI (urinary tract infection) 2007  . Hyperlipidemia   . Hypertension   . Irregular periods/menstrual cycles 2007  . Libido, decreased 2006  . Menopausal symptoms 2007  . Nocturia 2009  . Proteinuria 2009  . Sickle cell trait ([place])   . Tenderness of female pelvic organs 2005  . Ulcer   . Vaginal irritation 2011     Physical Exam: General: The patient is alert and oriented x3 in no acute distress.  Dermatology: Skin is warm, dry and supple bilateral lower extremities. Negative for open lesions or macerations.  Vascular: Palpable pedal pulses bilaterally. No edema or erythema noted. Capillary refill within normal limits.  Neurological: Epicritic and protective threshold grossly intact bilaterally.   Musculoskeletal Exam: Range of motion within normal limits to all pedal and ankle joints bilateral. Muscle strength 5/5 in all groups bilateral.  There is a small fluctuant palpable mass just distal to the endoscopic incision  site to the medial heel consistent with likely lipoma.  Tender to palpation.  Pain to palpation of the plantar aspect of the heel where the patient initially had pain has been resolved due to the endoscopic plantar fasciotomy  Assessment: 1.  Lipoma right medial heel   Plan of Care:  1. Patient evaluated.  2.  Patient declined injections 3.  Patient declined oral anti-inflammatories 4.  Continue Tylenol as needed 5.  Prescription for tramadol 50 mg 6.  Continue Voltaren gel 2%. 7.  Return to clinic as needed      Edrick Kins, DPM [facility]  Dr. Edrick Kins, DPM    [address], [place] [address]                Office [phone]  Fax [phone]

## 2019-01-24 ENCOUNTER — Encounter: Payer: Self-pay | Admitting: Family Medicine

## 2019-02-05 ENCOUNTER — Other Ambulatory Visit: Payer: Self-pay | Admitting: Gastroenterology

## 2019-02-05 NOTE — Telephone Encounter (Signed)
Called and left detailed for patient.  See OV note with  Armbruster from 9-21.  Did she reduce her Dexilant to every other day or wean off completely?

## 2019-02-05 NOTE — Telephone Encounter (Signed)
Patient called back.  She weaned off [place] and is no long taking.  She says she is doing well and did not request refill of Dexilant. Removing from her list.

## 2019-02-06 ENCOUNTER — Ambulatory Visit (INDEPENDENT_AMBULATORY_CARE_PROVIDER_SITE_OTHER): Payer: 59 | Admitting: Podiatry

## 2019-02-06 ENCOUNTER — Other Ambulatory Visit: Payer: Self-pay

## 2019-02-06 DIAGNOSIS — M722 Plantar fascial fibromatosis: Secondary | ICD-10-CM | POA: Diagnosis not present

## 2019-02-10 NOTE — Progress Notes (Signed)
   HPI: 63 y.o. female presenting today for follow up evaluation of pain the right medial heel. She states the pain is not improving despite taking Tylenol and Tramadol and using the Voltaren gel. There are no worsening factors noted. Patient is here for further evaluation and treatment.   Past Medical History:  Diagnosis Date  . Asthma   . Atrophic vaginitis 2009  . BV (bacterial vaginosis) 2005  . Candida vaginitis 2006  . Candida vaginitis 2009  . Depression   . Dysuria 2007  . Female pelvic pain 2006  . Fibroid 2005  . GERD (gastroesophageal reflux disease)   . H/O constipation 2009  . H/O dyspareunia 2008  . H/O urinary frequency 2011  . H/O vaginitis 2005  . Heart murmur   . History of bacterial infection   . History of hemorrhoids 2009  . Hx: UTI (urinary tract infection) 2007  . Hyperlipidemia   . Hypertension   . Irregular periods/menstrual cycles 2007  . Libido, decreased 2006  . Menopausal symptoms 2007  . Nocturia 2009  . Proteinuria 2009  . Sickle cell trait ([place])   . Tenderness of female pelvic organs 2005  . Ulcer   . Vaginal irritation 2011     Physical Exam: General: The patient is alert and oriented x3 in no acute distress.  Dermatology: Pinch callus noted to the right medial heel. Skin is warm, dry and supple bilateral lower extremities. Negative for open lesions or macerations.  Vascular: Palpable pedal pulses bilaterally. No edema or erythema noted. Capillary refill within normal limits.  Neurological: Epicritic and protective threshold grossly intact bilaterally.   Musculoskeletal Exam: Range of motion within normal limits to all pedal and ankle joints bilateral. Muscle strength 5/5 in all groups bilateral.   Assessment: 1. Pinch callus right medial heel    Plan of Care:  1. Patient evaluated.   2. Offloading donut pads dispensed.  3. OTC callus softener dispensed.  4. Continue using OTC insoles.  5. Return to clinic as needed.        Edrick Kins, DPM [facility]  Dr. Edrick Kins, DPM    [address], [place] [address]                Office [phone]  Fax [phone]

## 2019-03-07 ENCOUNTER — Encounter: Payer: Self-pay | Admitting: Physician Assistant

## 2019-03-07 ENCOUNTER — Other Ambulatory Visit: Payer: Self-pay

## 2019-03-07 ENCOUNTER — Ambulatory Visit (INDEPENDENT_AMBULATORY_CARE_PROVIDER_SITE_OTHER): Payer: 59 | Admitting: Orthopedic Surgery

## 2019-03-07 VITALS — Ht 63.0 in | Wt 175.0 lb

## 2019-03-07 DIAGNOSIS — M79671 Pain in right foot: Secondary | ICD-10-CM

## 2019-03-08 ENCOUNTER — Ambulatory Visit: Payer: 59 | Admitting: Podiatry

## 2019-03-26 ENCOUNTER — Encounter: Payer: Self-pay | Admitting: Orthopedic Surgery

## 2019-03-26 NOTE — Progress Notes (Signed)
Office Visit Note   Patient: Regina Sawyer           Date of Birth: [birth year]           MRN: [id number] Visit Date: 03/07/2019              Requested by: Leeanne Rio, [address] [place],  [place] [address] PCP: Leeanne Rio, MD  Chief Complaint  Patient presents with  . Right Foot - Pain      HPI: Patient is a 63 year old woman who presents complaining of medial right heel pain she complains of a knot on the side of her foot she is currently been using Ultram Tylenol and Voltaren gel she is status post gastrocnemius recession and plantar fascial release.  She denies any radicular symptoms denies a history of gout states the pain is worse with standing and worse with start up.  Assessment & Plan: Visit Diagnoses:  1. Pain in right foot     Plan: Patient's pain seems to be coming from scar tissue from the surgical incision.  Recommend Achilles stretching she was injected with 1 cc Depo-Medrol and 1 cc of lidocaine recommended continuing with the Voltaren gel.  Follow-Up Instructions: Return if symptoms worsen or fail to improve.   Ortho Exam  Patient is alert, oriented, no adenopathy, well-dressed, normal affect, normal respiratory effort. Examination patient has a good dorsalis pedis pulse her sensation is intact she has dorsiflexion only to neutral.  She has some scar tissue at the medial incision on the heel.  The tarsal tunnel is nontender to palpation negative Tinel's sign.  Imaging: No results found. No images are attached to the encounter.  Labs: Lab Results  Component Value Date   HGBA1C 5.7 07/19/2016   HGBA1C 5.2 03/30/2015   HGBA1C 5.7 05/12/2014   LABORGA Multiple bacterial morphotypes present, none 02/23/2012   LABORGA predominant. Suggest appropriate recollection if  02/23/2012   LABORGA clinically indicated. 02/23/2012     Lab Results  Component Value Date   ALBUMIN 4.4 12/04/2018   ALBUMIN 3.8 08/08/2017   ALBUMIN  4.0 07/19/2016    No results found for: MG No results found for: VD25OH  No results found for: PREALBUMIN CBC EXTENDED Latest Ref Rng & Units 04/19/2016 07/23/2014 08/05/2009  WBC 4.0 - 10.5 K/uL - 4.0 5.2  RBC 3.87 - 5.11 MIL/uL - 4.38 4.14  HGB 12.0 - 15.0 g/dL 11.9(L) 11.8(L) 11.1(L)  HCT 36.0 - 46.0 % 35.0(L) 37.4 35.0(L)  PLT 150 - 400 K/uL - 380 389  NEUTROABS 1.7 - 7.7 K/uL - 2.5 -  LYMPHSABS 0.7 - 4.0 K/uL - 1.1 -     Body mass index is 31 kg/m.  Orders:  No orders of the defined types were placed in this encounter.  No orders of the defined types were placed in this encounter.    Procedures: No procedures performed  Clinical Data: No additional findings.  ROS:  All other systems negative, except as noted in the HPI. Review of Systems  Objective: Vital Signs: Ht 5\' 3"  (1.6 m)   Wt 175 lb (79.4 kg)   BMI 31.00 kg/m   Specialty Comments:  No specialty comments available.  PMFS History: Patient Active Problem List   Diagnosis Date Noted  . Dysuria 07/11/2018  . Nocturia 07/11/2018  . Vitamin D deficiency 07/11/2018  . Rectal bleeding 01/04/2018  . Other constipation 01/04/2018  . Anxiety disorder, unspecified 01/03/2017  . Sleep concern 06/16/2016  .  Right ear pain 04/22/2016  . Cough 04/22/2016  . Hypercalcemia 01/14/2016  . Erythema and swelling of lower extremity 09/07/2015  . Right foot pain 12/09/2014  . Benign neoplasm of ascending colon 10/28/2014  . Internal hemorrhoids 10/28/2014  . Vulvar lesion 10/07/2014  . Abdominal pain, epigastric 07/10/2014  . Dry mouth 07/10/2014  . Weight loss 06/04/2014  . Prediabetes 05/13/2014  . Elevated serum creatinine 05/12/2014  . Injury of left index finger 01/23/2014  . Arm pain, left 01/23/2014  . Cervical polyp 10/14/2013  . Vaginal itching 11/03/2012  . Cystitis 10/16/2012  . Genital herpes simplex 03/11/2010  . Hyperlipidemia 08/26/2009  . VAGINITIS, ATROPHIC 05/08/2008  . POSTMENOPAUSAL  BLEEDING 11/29/2007  . SYSTOLIC MURMUR 99991111  . HYPERTENSION, BENIGN ESSENTIAL 02/05/2007  . ANEMIA, IRON DEFICIENCY, UNSPEC. 06/15/2006  . SICKLE CELL TRAIT 06/15/2006  . RHINITIS, ALLERGIC 06/15/2006  . GASTROESOPHAGEAL REFLUX, NO ESOPHAGITIS 06/15/2006   Past Medical History:  Diagnosis Date  . Asthma   . Atrophic vaginitis 2009  . BV (bacterial vaginosis) 2005  . Candida vaginitis 2006  . Candida vaginitis 2009  . Depression   . Dysuria 2007  . Female pelvic pain 2006  . Fibroid 2005  . GERD (gastroesophageal reflux disease)   . H/O constipation 2009  . H/O dyspareunia 2008  . H/O urinary frequency 2011  . H/O vaginitis 2005  . Heart murmur   . History of bacterial infection   . History of hemorrhoids 2009  . Hx: UTI (urinary tract infection) 2007  . Hyperlipidemia   . Hypertension   . Irregular periods/menstrual cycles 2007  . Libido, decreased 2006  . Menopausal symptoms 2007  . Nocturia 2009  . Proteinuria 2009  . Sickle cell trait ([place])   . Tenderness of female pelvic organs 2005  . Ulcer   . Vaginal irritation 2011    Family History  Problem Relation Age of Onset  . Diabetes Sister   . Hypertension Sister   . Colon cancer Father   . Heart failure Father   . Heart failure Brother        x2  . Gallstones Mother   . Colon polyps Neg Hx   . Kidney disease Neg Hx   . Esophageal cancer Neg Hx   . Gallbladder disease Neg Hx     Past Surgical History:  Procedure Laterality Date  . CARPAL TUNNEL RELEASE     both wrists  . COLONOSCOPY N/A 10/28/2014   Procedure: COLONOSCOPY;  Surgeon: Inda Castle, MD;  Location: [facility];  Service: Endoscopy;  Laterality: N/A;  . ESOPHAGOGASTRODUODENOSCOPY N/A 08/06/2014   Procedure: ESOPHAGOGASTRODUODENOSCOPY (EGD);  Surgeon: Inda Castle, MD;  Location: [facility];  Service: Endoscopy;  Laterality: N/A;  . TUBAL LIGATION     Social History   Occupational History  . Occupation: Psychologist, educational   Tobacco Use  . Smoking status: Passive Smoke Exposure - Never Smoker  . Smokeless tobacco: Never Used  Substance and Sexual Activity  . Alcohol use: No  . Drug use: No  . Sexual activity: Not on file    Comment: BTL

## 2019-03-27 ENCOUNTER — Other Ambulatory Visit: Payer: Self-pay

## 2019-03-27 ENCOUNTER — Ambulatory Visit (INDEPENDENT_AMBULATORY_CARE_PROVIDER_SITE_OTHER): Payer: 59 | Admitting: Podiatry

## 2019-03-27 ENCOUNTER — Telehealth: Payer: Self-pay | Admitting: *Deleted

## 2019-03-27 DIAGNOSIS — M79671 Pain in right foot: Secondary | ICD-10-CM

## 2019-03-27 DIAGNOSIS — M722 Plantar fascial fibromatosis: Secondary | ICD-10-CM

## 2019-03-27 DIAGNOSIS — M7989 Other specified soft tissue disorders: Secondary | ICD-10-CM | POA: Diagnosis not present

## 2019-03-27 DIAGNOSIS — Z9889 Other specified postprocedural states: Secondary | ICD-10-CM

## 2019-03-27 NOTE — Telephone Encounter (Signed)
Orders given to G. Hassell Done, CMA for pre-cert, faxed to [facility].

## 2019-03-27 NOTE — Telephone Encounter (Signed)
-----   Message from Edrick Kins, DPM sent at 03/27/2019  2:10 PM EST ----- Regarding: MRI RT heel Please order MRI RT heel w/out contrast.  Dx: chronic PF RT. History of EPF RT. Possible soft tissue mass RT heel.   Thanks, Dr. Amalia Hailey

## 2019-03-28 ENCOUNTER — Ambulatory Visit: Payer: 59 | Admitting: Orthopedic Surgery

## 2019-03-31 NOTE — Progress Notes (Signed)
   Subjective: 63 y.o. female presenting today for follow up evaluation of right heel pain. She reports continued pain that is relatively unchanged since her last visit. She continues to use the OTC insoles and has received an injection with no significant relief. Walking and being on the foot increases the pain. Patient is here for further evaluation and treatment.    Past Medical History:  Diagnosis Date  . Asthma   . Atrophic vaginitis 2009  . BV (bacterial vaginosis) 2005  . Candida vaginitis 2006  . Candida vaginitis 2009  . Depression   . Dysuria 2007  . Female pelvic pain 2006  . Fibroid 2005  . GERD (gastroesophageal reflux disease)   . H/O constipation 2009  . H/O dyspareunia 2008  . H/O urinary frequency 2011  . H/O vaginitis 2005  . Heart murmur   . History of bacterial infection   . History of hemorrhoids 2009  . Hx: UTI (urinary tract infection) 2007  . Hyperlipidemia   . Hypertension   . Irregular periods/menstrual cycles 2007  . Libido, decreased 2006  . Menopausal symptoms 2007  . Nocturia 2009  . Proteinuria 2009  . Sickle cell trait ([place])   . Tenderness of female pelvic organs 2005  . Ulcer   . Vaginal irritation 2011     Objective: Physical Exam General: The patient is alert and oriented x3 in no acute distress.  Dermatology: Skin is warm, dry and supple bilateral lower extremities. Negative for open lesions or macerations bilateral.   Vascular: Dorsalis Pedis and Posterior Tibial pulses palpable bilateral.  Capillary fill time is immediate to all digits.  Neurological: Epicritic and protective threshold intact bilateral.   Musculoskeletal: Tenderness to palpation to the plantar aspect of the right heel along the plantar fascia. All other joints range of motion within normal limits bilateral. Strength 5/5 in all groups bilateral.    Assessment: 1. Plantar fasciitis right chronic 2. H/o EPF right 3. Possible soft tissue mass right heel    Plan of Care:  1. Patient evaluated.  2. MRI of right heel ordered.  3. Continue using OTC insoles.  4. Return to clinic in 4 weeks to review MRI results.     Ed[place] Kins, DPM [facility]  Dr. Edrick Kins, DPM    [address], [place] [address]                Office [phone]  Fax [phone]

## 2019-04-05 ENCOUNTER — Telehealth: Payer: Self-pay | Admitting: Podiatry

## 2019-04-05 NOTE — Telephone Encounter (Signed)
Pt called to say shes in pain can Dr. Amalia Hailey refill her tramadol

## 2019-04-15 ENCOUNTER — Other Ambulatory Visit: Payer: Self-pay | Admitting: Family Medicine

## 2019-04-23 ENCOUNTER — Other Ambulatory Visit: Payer: 59

## 2019-04-23 ENCOUNTER — Ambulatory Visit
Admission: RE | Admit: 2019-04-23 | Discharge: 2019-04-23 | Disposition: A | Discharge status: Home / Self Care | Payer: 59 | Source: Ambulatory Visit | Attending: Podiatry | Admitting: Podiatry

## 2019-04-23 ENCOUNTER — Other Ambulatory Visit: Payer: Self-pay

## 2019-04-23 DIAGNOSIS — M7989 Other specified soft tissue disorders: Secondary | ICD-10-CM

## 2019-04-23 DIAGNOSIS — M722 Plantar fascial fibromatosis: Secondary | ICD-10-CM

## 2019-04-23 DIAGNOSIS — Z9889 Other specified postprocedural states: Secondary | ICD-10-CM

## 2019-04-24 ENCOUNTER — Ambulatory Visit (INDEPENDENT_AMBULATORY_CARE_PROVIDER_SITE_OTHER): Payer: 59 | Admitting: Podiatry

## 2019-04-24 DIAGNOSIS — Z9889 Other specified postprocedural states: Secondary | ICD-10-CM | POA: Diagnosis not present

## 2019-04-24 DIAGNOSIS — M722 Plantar fascial fibromatosis: Secondary | ICD-10-CM | POA: Diagnosis not present

## 2019-04-24 NOTE — Patient Instructions (Signed)
Pre-Operative Instructions  Congratulations, you have decided to take an important step towards improving your quality of life.  You can be assured that the doctors and staff at [facility] will be with you every step of the way.  Here are some important things you should know:  1. Plan to be at the surgery center/hospital at least 1 (one) hour prior to your scheduled time, unless otherwise directed by the surgical center/hospital staff.  You must have a responsible adult accompany you, remain during the surgery and drive you home.  Make sure you have directions to the surgical center/hospital to ensure you arrive on time. 2. If you are having surgery at [facility] or [facility] hospitals, you will need a copy of your medical history and physical form from your family physician within one month prior to the date of surgery. We will give you a form for your primary physician to complete.  3. We make every effort to accommodate the date you request for surgery.  However, there are times where surgery dates or times have to be moved.  We will contact you as soon as possible if a change in schedule is required.   4. No aspirin/ibuprofen for one week before surgery.  If you are on aspirin, any non-steroidal anti-inflammatory medications (Mobic, Aleve, Ibuprofen) should not be taken seven (7) days prior to your surgery.  You make take Tylenol for pain prior to surgery.  5. Medications - If you are taking daily heart and blood pressure medications, seizure, reflux, allergy, asthma, anxiety, pain or diabetes medications, make sure you notify the surgery center/hospital before the day of surgery so they can tell you which medications you should take or avoid the day of surgery. 6. No food or drink after midnight the night before surgery unless directed otherwise by surgical center/hospital staff. 7. No alcoholic beverages 24-hours prior to surgery.  No smoking 24-hours prior or 24-hours after  surgery. 8. Wear loose pants or shorts. They should be loose enough to fit over bandages, boots, and casts. 9. Don't wear slip-on shoes. Sneakers are preferred. 10. Bring your boot with you to the surgery center/hospital.  Also bring crutches or a walker if your physician has prescribed it for you.  If you do not have this equipment, it will be provided for you after surgery. 11. If you have not been contacted by the surgery center/hospital by the day before your surgery, call to confirm the date and time of your surgery. 12. Leave-time from work may vary depending on the type of surgery you have.  Appropriate arrangements should be made prior to surgery with your employer. 13. Prescriptions will be provided immediately following surgery by your doctor.  Fill these as soon as possible after surgery and take the medication as directed. Pain medications will not be refilled on weekends and must be approved by the doctor. 14. Remove nail polish on the operative foot and avoid getting pedicures prior to surgery. 15. Wash the night before surgery.  The night before surgery wash the foot and leg well with water and the antibacterial soap provided. Be sure to pay special attention to beneath the toenails and in between the toes.  Wash for at least three (3) minutes. Rinse thoroughly with water and dry well with a towel.  Perform this wash unless told not to do so by your physician.  Enclosed: 1 Ice pack (please put in freezer the night before surgery)   1 Hibiclens skin cleaner     Pre-op instructions  If you have any questions regarding the instructions, please do not hesitate to call our office.  [place]: [address], [place], [place] [address] -- [phone]  [place]: [address] [place], [place] [address] -- [phone]  [place]: [address], [place], [place] [address] -- [phone]   Website: [email or web address] 

## 2019-04-29 ENCOUNTER — Telehealth: Payer: Self-pay | Admitting: Podiatry

## 2019-04-29 NOTE — Telephone Encounter (Signed)
Pt called to know when her surgery date is.

## 2019-04-29 NOTE — Progress Notes (Signed)
   Subjective: 64 y.o. female presenting today for follow up evaluation of chronic plantar fasciitis of the right foot. She reports continued pain of the foot. She has been using the OTC insoles as directed. Being on the foot for long periods of time increases her pain. She had an MRI of the right foot on 04/23/2019. Patient is here for further evaluation and treatment.   Past Medical History:  Diagnosis Date  . Asthma   . Atrophic vaginitis 2009  . BV (bacterial vaginosis) 2005  . Candida vaginitis 2006  . Candida vaginitis 2009  . Depression   . Dysuria 2007  . Female pelvic pain 2006  . Fibroid 2005  . GERD (gastroesophageal reflux disease)   . H/O constipation 2009  . H/O dyspareunia 2008  . H/O urinary frequency 2011  . H/O vaginitis 2005  . Heart murmur   . History of bacterial infection   . History of hemorrhoids 2009  . Hx: UTI (urinary tract infection) 2007  . Hyperlipidemia   . Hypertension   . Irregular periods/menstrual cycles 2007  . Libido, decreased 2006  . Menopausal symptoms 2007  . Nocturia 2009  . Proteinuria 2009  . Sickle cell trait ([place])   . Tenderness of female pelvic organs 2005  . Ulcer   . Vaginal irritation 2011     Objective: Physical Exam General: The patient is alert and oriented x3 in no acute distress.  Dermatology: Skin is warm, dry and supple bilateral lower extremities. Negative for open lesions or macerations bilateral.   Vascular: Dorsalis Pedis and Posterior Tibial pulses palpable bilateral.  Capillary fill time is immediate to all digits.  Neurological: Epicritic and protective threshold intact bilateral.   Musculoskeletal: Tenderness to palpation to the plantar aspect of the right heel along the plantar fascia. All other joints range of motion within normal limits bilateral. Strength 5/5 in all groups bilateral.   MRI Impression:  1. Focal intermediate signal in the medial band of the plantar fascia measuring 7 x 10 mm which  may reflect focal severe plantar fasciitis versus a plantar fibroma.  Assessment: 1. Plantar fasciitis right  Plan of Care:  1. Patient evaluated. MRI reviewed.   2. Today we discussed the conservative versus surgical management of the presenting pathology. The patient opts for surgical management. All possible complications and details of the procedure were explained. All patient questions were answered. No guarantees were expressed or implied. 3. Authorization for surgery was initiated today. Surgery will consist of open plantar fasciotomy right.  4. Return to clinic one week post op.    Edrick Kins, DPM [facility]  Dr. Edrick Kins, DPM    [address], [place] [address]                Office [phone]  Fax [phone]

## 2019-04-29 NOTE — Telephone Encounter (Signed)
I am returning your call.  We have you scheduled for surgery on June 20, 2019.  "Why so long?"  His schedules are full.  That date is his next available date.  "Okay go ahead and put me down for then.  What if I want to wait until school is out and have it done?  Maybe have it done sometime in August."  If you want to have it done in August, that is fine.  "Just go ahead and put me down for March."

## 2019-06-15 ENCOUNTER — Ambulatory Visit: Payer: 59 | Attending: Internal Medicine

## 2019-06-15 ENCOUNTER — Other Ambulatory Visit: Payer: Self-pay | Admitting: Family Medicine

## 2019-06-24 ENCOUNTER — Telehealth: Payer: Self-pay | Admitting: Podiatry

## 2019-06-24 ENCOUNTER — Other Ambulatory Visit: Payer: Self-pay | Admitting: Podiatry

## 2019-06-24 NOTE — Telephone Encounter (Signed)
Pt callled stating she is still in pain and could Dr.Evans give her some tramadol for her pain again

## 2019-06-24 NOTE — Telephone Encounter (Signed)
Left message informing pt she was to schedule an appt 1 week after the last appt, to call for an appt and discuss the pain medication with Dr. Amalia Hailey at that time.

## 2019-07-08 ENCOUNTER — Ambulatory Visit (INDEPENDENT_AMBULATORY_CARE_PROVIDER_SITE_OTHER): Payer: 59 | Admitting: Podiatry

## 2019-07-08 ENCOUNTER — Other Ambulatory Visit: Payer: Self-pay

## 2019-07-08 DIAGNOSIS — M722 Plantar fascial fibromatosis: Secondary | ICD-10-CM

## 2019-07-10 NOTE — Progress Notes (Signed)
   Subjective: 64 y.o. female presenting today for follow up evaluation of chronic plantar fasciitis of the right foot. She reports continued pain. She states the injection provided relief for about one day. She is now interested in surgical intervention. There are no alleviating factors noted. Patient is here for further evaluation and treatment.   Past Medical History:  Diagnosis Date  . Asthma   . Atrophic vaginitis 2009  . BV (bacterial vaginosis) 2005  . Candida vaginitis 2006  . Candida vaginitis 2009  . Depression   . Dysuria 2007  . Female pelvic pain 2006  . Fibroid 2005  . GERD (gastroesophageal reflux disease)   . H/O constipation 2009  . H/O dyspareunia 2008  . H/O urinary frequency 2011  . H/O vaginitis 2005  . Heart murmur   . History of bacterial infection   . History of hemorrhoids 2009  . Hx: UTI (urinary tract infection) 2007  . Hyperlipidemia   . Hypertension   . Irregular periods/menstrual cycles 2007  . Libido, decreased 2006  . Menopausal symptoms 2007  . Nocturia 2009  . Proteinuria 2009  . Sickle cell trait ([place])   . Tenderness of female pelvic organs 2005  . Ulcer   . Vaginal irritation 2011     Objective: Physical Exam General: The patient is alert and oriented x3 in no acute distress.  Dermatology: Skin is warm, dry and supple bilateral lower extremities. Negative for open lesions or macerations bilateral.   Vascular: Dorsalis Pedis and Posterior Tibial pulses palpable bilateral.  Capillary fill time is immediate to all digits.  Neurological: Epicritic and protective threshold intact bilateral.   Musculoskeletal: Tenderness to palpation to the plantar aspect of the right heel along the plantar fascia. All other joints range of motion within normal limits bilateral. Strength 5/5 in all groups bilateral.   Assessment: 1. Plantar fasciitis right 2. H/o EPF right   Plan of Care:  1. Patient evaluated.  2. Patient wants surgery performed  at the end of May.  3. Authorization for surgery was re-initiated today. Surgery will consist of open plantar fasciotomy right.  4. Return to clinic one week post op.    Edrick Kins, DPM [facility]  Dr. Edrick Kins, DPM    [address], [place] [address]                Office [phone]  Fax [phone]

## 2019-07-13 ENCOUNTER — Other Ambulatory Visit: Payer: Self-pay | Admitting: Podiatry

## 2019-07-13 ENCOUNTER — Other Ambulatory Visit: Payer: Self-pay | Admitting: Family Medicine

## 2019-07-16 ENCOUNTER — Telehealth: Payer: Self-pay | Admitting: *Deleted

## 2019-07-16 NOTE — Telephone Encounter (Signed)
Left message informing pt the medication had been called to the [facility].

## 2019-07-16 NOTE — Telephone Encounter (Signed)
Pt states the pain medication was not called to the pharmacy.

## 2019-07-26 ENCOUNTER — Other Ambulatory Visit: Payer: Self-pay

## 2019-07-26 MED ORDER — ESCITALOPRAM OXALATE 20 MG PO TABS: 20.0000 mg | ORAL_TABLET | Freq: Every day | ORAL | 1 refills | Status: DC

## 2019-07-26 MED ORDER — LISINOPRIL 20 MG PO TABS: 20.0000 mg | ORAL_TABLET | Freq: Every day | ORAL | 1 refills | Status: DC

## 2019-07-26 MED ORDER — ROSUVASTATIN CALCIUM 20 MG PO TABS: 20.0000 mg | ORAL_TABLET | Freq: Every day | ORAL | 1 refills | Status: DC

## 2019-07-26 MED ORDER — MIRTAZAPINE 15 MG PO TABS: ORAL_TABLET | ORAL | 1 refills | Status: DC

## 2019-07-26 MED ORDER — CARVEDILOL 6.25 MG PO TABS: 6.2500 mg | ORAL_TABLET | Freq: Two times a day (BID) | ORAL | 1 refills | Status: DC

## 2019-08-02 ENCOUNTER — Ambulatory Visit: Payer: 59

## 2019-08-27 DIAGNOSIS — M6530 Trigger finger, unspecified finger: Secondary | ICD-10-CM | POA: Insufficient documentation

## 2019-08-30 ENCOUNTER — Telehealth: Payer: Self-pay

## 2019-08-30 NOTE — Telephone Encounter (Signed)
DOS 09/12/19  EPF RT - 28060  [employer] EFFECTIVE DATE - 04/19/2019  PLAN DEDUCTIBLE - $198.00 W/$0.00 REMAINING OUT OF POCKET - $0.00 COPAY $0.00 COINSURANCE - 20%    Notification or Prior Authorization is not required for the requested services  This [employer] Medicare Advantage members plan does not currently require a prior authorization for these services. If you have general questions about the prior authorization requirements, please call us at [phone] or visit [email or web address] > Clinician Resources > Advance and Admission Notification Requirements. The number above acknowledges your notification. Please write this number down for future reference. Notification is not a guarantee of coverage or payment.  Decision ID [id number]

## 2019-09-04 ENCOUNTER — Other Ambulatory Visit: Payer: Self-pay

## 2019-09-04 ENCOUNTER — Ambulatory Visit (INDEPENDENT_AMBULATORY_CARE_PROVIDER_SITE_OTHER): Payer: 59 | Admitting: Podiatry

## 2019-09-04 ENCOUNTER — Ambulatory Visit (INDEPENDENT_AMBULATORY_CARE_PROVIDER_SITE_OTHER): Payer: 59

## 2019-09-04 DIAGNOSIS — M7989 Other specified soft tissue disorders: Secondary | ICD-10-CM | POA: Diagnosis not present

## 2019-09-04 DIAGNOSIS — M722 Plantar fascial fibromatosis: Secondary | ICD-10-CM

## 2019-09-07 NOTE — Progress Notes (Signed)
   Subjective: 64 y.o. female presenting today for follow up evaluation of chronic plantar fasciitis of the right foot. She is currently scheduled for surgery of open plantar fasciotomy right and has signed all paperwork. She presents today for follow up of burning pain of a palpable nodule of the right foot. She has been treating conservatively as directed. Being on the foot increases the pain. Patient is here for further evaluation and treatment.   Past Medical History:  Diagnosis Date  . Asthma   . Atrophic vaginitis 2009  . BV (bacterial vaginosis) 2005  . Candida vaginitis 2006  . Candida vaginitis 2009  . Depression   . Dysuria 2007  . Female pelvic pain 2006  . Fibroid 2005  . GERD (gastroesophageal reflux disease)   . H/O constipation 2009  . H/O dyspareunia 2008  . H/O urinary frequency 2011  . H/O vaginitis 2005  . Heart murmur   . History of bacterial infection   . History of hemorrhoids 2009  . Hx: UTI (urinary tract infection) 2007  . Hyperlipidemia   . Hypertension   . Irregular periods/menstrual cycles 2007  . Libido, decreased 2006  . Menopausal symptoms 2007  . Nocturia 2009  . Proteinuria 2009  . Sickle cell trait ([place])   . Tenderness of female pelvic organs 2005  . Ulcer   . Vaginal irritation 2011     Objective: Physical Exam General: The patient is alert and oriented x3 in no acute distress.  Dermatology: Skin is warm, dry and supple bilateral lower extremities. Negative for open lesions or macerations bilateral.   Vascular: Dorsalis Pedis and Posterior Tibial pulses palpable bilateral.  Capillary fill time is immediate to all digits.  Neurological: Epicritic and protective threshold intact bilateral.   Musculoskeletal: Tenderness to palpation to the plantar aspect of the right heel along the plantar fascia. Palpable, painful soft tissue mass noted to the right medial foot/heel. All other joints range of motion within normal limits bilateral.  Strength 5/5 in all groups bilateral.   Radiographic Exam:  Normal osseous mineralization. Joint spaces preserved. No fracture/dislocation/boney destruction.    Assessment: 1. Plantar fasciitis right 2. H/o EPF right  3. Soft tissue mass right medial foot   Plan of Care:  1. Patient evaluated. X-Rays reviewed.  2. Surgery scheduled for 09/12/2019.  3. Today we discussed the conservative versus surgical management of the presenting pathology. The patient opts for surgical management. All possible complications and details of the procedure were explained. All patient questions were answered. No guarantees were expressed or implied. 4. Authorization for surgery was initiated today. Surgery will consist of open plantar fasciotomy through a longitudinal incision with excision of soft tissue mass, added to consent.  5. Return to clinic one week post op.    Edrick Kins, DPM [facility]  Dr. Edrick Kins, DPM    [address],  [address]                Office [phone]  Fax [phone]

## 2019-09-12 ENCOUNTER — Other Ambulatory Visit: Payer: Self-pay | Admitting: Podiatry

## 2019-09-12 ENCOUNTER — Telehealth: Payer: Self-pay | Admitting: Podiatry

## 2019-09-12 ENCOUNTER — Encounter: Payer: Self-pay | Admitting: Podiatry

## 2019-09-12 DIAGNOSIS — M722 Plantar fascial fibromatosis: Secondary | ICD-10-CM | POA: Diagnosis not present

## 2019-09-12 MED ORDER — OXYCODONE-ACETAMINOPHEN 5-325 MG PO TABS: 1.0000 | ORAL_TABLET | Freq: Four times a day (QID) | ORAL | 0 refills | Status: DC | PRN

## 2019-09-12 MED ORDER — IBUPROFEN 800 MG PO TABS: 800.0000 mg | ORAL_TABLET | Freq: Three times a day (TID) | ORAL | 1 refills | Status: DC | PRN

## 2019-09-12 NOTE — Progress Notes (Signed)
PRN postop 

## 2019-09-12 NOTE — Telephone Encounter (Signed)
Pt presented to the office with #30 Percocet 5/325 and #90 Ibuprofen 800mg . I disposed of the ibuprofen in the sharps needle container. Clinical Supervisor - Sindy Messing, RN and I counted #30 Percocet 5/325, and I crushed and filled with water and disposed in the sharps needle container with Dr. Norton Blizzard and Jearld Fenton, CMA observing.

## 2019-09-12 NOTE — Telephone Encounter (Signed)
I spoke with Regina Sawyer and she states she has a cough, denies chest pain or leg pain at this time. I told Regina Sawyer that if they intubated her it may make her throat irritated and even the coldness of the surgery suite and her relaxed state could have made her throat dry and scratchy. I instructed her to gargle with salt water and drink lots of fluids. Regina Sawyer states she can't take the Ibuprofen due to her stomach and doesn't want to take the oxycodone that she picked up, she would like tramadol. I told Regina Sawyer that once she brought the oxycodone to the office to be destroyed I would have Dr. Amalia Hailey ordered the tramadol, so she would not mistakenly take together. Regina Sawyer states she will bring tomorrow.

## 2019-09-12 NOTE — Telephone Encounter (Signed)
Pt had surgery this am with Dr Amalia Hailey & states she now has a cough and wants to speak with someone.

## 2019-09-13 ENCOUNTER — Other Ambulatory Visit: Payer: Self-pay | Admitting: Podiatry

## 2019-09-13 MED ORDER — TRAMADOL HCL 50 MG PO TABS: 50.0000 mg | ORAL_TABLET | ORAL | 0 refills | Status: AC | PRN

## 2019-09-13 NOTE — Progress Notes (Signed)
PRN postop 

## 2019-09-18 ENCOUNTER — Ambulatory Visit (INDEPENDENT_AMBULATORY_CARE_PROVIDER_SITE_OTHER): Payer: 59 | Admitting: Podiatry

## 2019-09-18 ENCOUNTER — Other Ambulatory Visit: Payer: Self-pay

## 2019-09-18 DIAGNOSIS — M722 Plantar fascial fibromatosis: Secondary | ICD-10-CM | POA: Diagnosis not present

## 2019-09-18 DIAGNOSIS — Z9889 Other specified postprocedural states: Secondary | ICD-10-CM

## 2019-09-18 MED ORDER — OXYCODONE-ACETAMINOPHEN 5-325 MG PO TABS: 1.0000 | ORAL_TABLET | Freq: Four times a day (QID) | ORAL | 0 refills | Status: DC | PRN

## 2019-09-18 NOTE — Progress Notes (Signed)
   Subjective:  Patient presents today status post open plantar fasciotomy right foot.. DOS: 09/12/2019.  Patient states that she is healing well without any major concerns.  She Does have some moderate pain.  Denies any nausea vomiting fever chills shortness of breath.  She has been weightbearing as tolerated in the cam boot as directed.  Past Medical History:  Diagnosis Date  . Asthma   . Atrophic vaginitis 2009  . BV (bacterial vaginosis) 2005  . Candida vaginitis 2006  . Candida vaginitis 2009  . Depression   . Dysuria 2007  . Female pelvic pain 2006  . Fibroid 2005  . GERD (gastroesophageal reflux disease)   . H/O constipation 2009  . H/O dyspareunia 2008  . H/O urinary frequency 2011  . H/O vaginitis 2005  . Heart murmur   . History of bacterial infection   . History of hemorrhoids 2009  . Hx: UTI (urinary tract infection) 2007  . Hyperlipidemia   . Hypertension   . Irregular periods/menstrual cycles 2007  . Libido, decreased 2006  . Menopausal symptoms 2007  . Nocturia 2009  . Proteinuria 2009  . Sickle cell trait ([place])   . Tenderness of female pelvic organs 2005  . Ulcer   . Vaginal irritation 2011      Objective/Physical Exam Neurovascular status intact.  Skin incision appears to be well coapted with suturesintact. No sign of infectious process noted. No dehiscence. No active bleeding noted. Moderate edema noted to the surgical extremity.  Assessment: 1. s/p open plantar fasciotomy right foot. DOS: 09/12/2019   Plan of Care:  1. Patient was evaluated.  2.  Dressings were changed today.  Keep clean dry and intact x1 week 3.  Postoperative shoe dispensed.  Discontinue the cam boot.  Weightbearing as tolerated. 4.  Refill prescription for Percocet 5/325 mg 5.  Return to clinic in 1 week for suture removal   Edrick Kins, DPM [facility]  Dr. Edrick Kins, [address]                                        [place], [place]  [address]                Office [phone]  Fax [phone]

## 2019-09-19 ENCOUNTER — Telehealth: Payer: Self-pay | Admitting: Podiatry

## 2019-09-19 MED ORDER — TRAMADOL HCL 50 MG PO TABS: 50.0000 mg | ORAL_TABLET | Freq: Three times a day (TID) | ORAL | 0 refills | Status: AC | PRN

## 2019-09-19 NOTE — Telephone Encounter (Addendum)
I spoke with pt and she states she told the pharmacy she didn't want the oxycodone she was afraid she would get mixed up, she uses tramadol. Pt states the dressing is coming off. I asked if it was the outer band and she stated yes, and I instructed to rewrap and fasten with tape. Pt states they didn't clean her foot and I told her that was fine, the blood was hers and dressing were sterile.

## 2019-09-19 NOTE — Telephone Encounter (Signed)
I spoke with Regina Sawyer and asked if pt had left the oxycodone, and she states pt did not pick up. Dr. Amalia Hailey ordered Tramadol 50mg  #30 one tablet every 8 hours prn foot pain.

## 2019-09-19 NOTE — Addendum Note (Signed)
Addended by: Harriett Sine D on: 09/19/2019 04:04 PM   Modules accepted: Orders

## 2019-09-19 NOTE — Telephone Encounter (Signed)
Patient called stating she cannot take Oxycodone and wanted to know if she can get that Rx changed. Would like a callback once Rx is changed.

## 2019-09-20 ENCOUNTER — Telehealth: Payer: Self-pay | Admitting: *Deleted

## 2019-09-20 NOTE — Telephone Encounter (Signed)
Patient called and stated that she had surgery by Dr Amalia Hailey (EPF) and the site was bleeding and I called and left a message returning the patient's call. Regina Sawyer

## 2019-09-23 ENCOUNTER — Telehealth: Payer: Self-pay | Admitting: Podiatry

## 2019-09-23 NOTE — Telephone Encounter (Signed)
Pt called and stated she had a stich loose offered appt rt now she stated she would wait till her sched appt on wednesday

## 2019-09-25 ENCOUNTER — Ambulatory Visit (INDEPENDENT_AMBULATORY_CARE_PROVIDER_SITE_OTHER): Payer: 59 | Admitting: Podiatry

## 2019-09-25 ENCOUNTER — Encounter: Payer: Self-pay | Admitting: Podiatry

## 2019-09-25 ENCOUNTER — Other Ambulatory Visit: Payer: Self-pay

## 2019-09-25 DIAGNOSIS — M722 Plantar fascial fibromatosis: Secondary | ICD-10-CM

## 2019-09-25 DIAGNOSIS — Z9889 Other specified postprocedural states: Secondary | ICD-10-CM

## 2019-09-27 ENCOUNTER — Telehealth: Payer: Self-pay | Admitting: Podiatry

## 2019-09-27 NOTE — Telephone Encounter (Signed)
Pt calling to confirm if she is able to shower and get her foot wet. DOS 09/12/19.  Please give patient a call

## 2019-09-27 NOTE — Telephone Encounter (Signed)
I called pt and asked what Dr. Amalia Hailey had said and she stated he said she could shower. I told her then it was fine to shower.

## 2019-09-30 NOTE — Progress Notes (Signed)
   Subjective:  Patient presents today status post open plantar fasciotomy right foot.. DOS: 09/12/2019.  Patient states that she is having some throbbing pain to the surgical foot.  She does admit to being on her foot more than she would like to be.  She is currently not taking anything for pain other than OTC Tylenol.  At times the pain can be severe with prolonged weightbearing.  No new complaints at this time  Past Medical History:  Diagnosis Date  . Asthma   . Atrophic vaginitis 2009  . BV (bacterial vaginosis) 2005  . Candida vaginitis 2006  . Candida vaginitis 2009  . Depression   . Dysuria 2007  . Female pelvic pain 2006  . Fibroid 2005  . GERD (gastroesophageal reflux disease)   . H/O constipation 2009  . H/O dyspareunia 2008  . H/O urinary frequency 2011  . H/O vaginitis 2005  . Heart murmur   . History of bacterial infection   . History of hemorrhoids 2009  . Hx: UTI (urinary tract infection) 2007  . Hyperlipidemia   . Hypertension   . Irregular periods/menstrual cycles 2007  . Libido, decreased 2006  . Menopausal symptoms 2007  . Nocturia 2009  . Proteinuria 2009  . Sickle cell trait ([place])   . Tenderness of female pelvic organs 2005  . Ulcer   . Vaginal irritation 2011      Objective/Physical Exam Neurovascular status intact.  Skin incision appears to be well coapted with suturesintact.  There is some moderate edema noted to the surgical incision area.  Tenderness to palpation around that area.  No erythema or edema noted around the incision site.  No clinical evidence of infection.  Assessment: 1. s/p open plantar fasciotomy right foot. DOS: 09/12/2019   Plan of Care:  1. Patient was evaluated.  2.  Dressings were changed today.  Keep clean dry and intact x1 week 3.  Ace wraps applied.  Recommend Ace wrap daily for compression to reduce the swelling 4.  Patient may discontinue weightbearing in the postoperative shoe.  Recommend good supportive shoes.   Recommend that the patient continue to be minimal weightbearing 5.  Return to clinic in 10 days for suture removal  Edrick Kins, DPM [facility]  Dr. Edrick Kins, [address]                                        [place], [place] [address]                Office [phone]  Fax [phone]

## 2019-10-09 ENCOUNTER — Ambulatory Visit (INDEPENDENT_AMBULATORY_CARE_PROVIDER_SITE_OTHER): Payer: 59

## 2019-10-09 ENCOUNTER — Other Ambulatory Visit: Payer: Self-pay

## 2019-10-09 ENCOUNTER — Ambulatory Visit (INDEPENDENT_AMBULATORY_CARE_PROVIDER_SITE_OTHER): Payer: 59 | Admitting: Podiatry

## 2019-10-09 DIAGNOSIS — M722 Plantar fascial fibromatosis: Secondary | ICD-10-CM | POA: Diagnosis not present

## 2019-10-09 DIAGNOSIS — Z9889 Other specified postprocedural states: Secondary | ICD-10-CM

## 2019-10-09 DIAGNOSIS — M7989 Other specified soft tissue disorders: Secondary | ICD-10-CM

## 2019-10-09 NOTE — Progress Notes (Signed)
   Subjective:  Patient presents today status post open plantar fasciotomy right foot.. DOS: 09/12/2019.  Patient states she is doing very well.  She does have some tenderness to the area.  She has been weightbearing in the postoperative shoe as directed.  No new complaints at this time  Past Medical History:  Diagnosis Date  . Asthma   . Atrophic vaginitis 2009  . BV (bacterial vaginosis) 2005  . Candida vaginitis 2006  . Candida vaginitis 2009  . Depression   . Dysuria 2007  . Female pelvic pain 2006  . Fibroid 2005  . GERD (gastroesophageal reflux disease)   . H/O constipation 2009  . H/O dyspareunia 2008  . H/O urinary frequency 2011  . H/O vaginitis 2005  . Heart murmur   . History of bacterial infection   . History of hemorrhoids 2009  . Hx: UTI (urinary tract infection) 2007  . Hyperlipidemia   . Hypertension   . Irregular periods/menstrual cycles 2007  . Libido, decreased 2006  . Menopausal symptoms 2007  . Nocturia 2009  . Proteinuria 2009  . Sickle cell trait ([place])   . Tenderness of female pelvic organs 2005  . Ulcer   . Vaginal irritation 2011      Objective/Physical Exam Neurovascular status intact.  Skin incision appears to be well coapted with suturesintact.  There continues to be some moderate edema noted to the surgical incision area.  Improved tenderness to palpation around that area.  No erythema or edema noted around the incision site.  No clinical evidence of infection.  Assessment: 1. s/p open plantar fasciotomy right foot. DOS: 09/12/2019   Plan of Care:  1. Patient was evaluated.  2.  Sutures removed today. 3.  Recommend compression Ace wrap daily 4.  Recommend lotion daily to the incision area to soften the callus around the area 5.  Continue postoperative shoe 6.  Return to clinic in 4 weeks   Edrick Kins, DPM [facility]  Dr. Edrick Kins, [address]                                          [place], [place] [address]                Office [phone]  Fax [phone]

## 2019-11-05 ENCOUNTER — Other Ambulatory Visit: Payer: Self-pay | Admitting: Podiatry

## 2019-11-05 DIAGNOSIS — M722 Plantar fascial fibromatosis: Secondary | ICD-10-CM

## 2019-11-06 ENCOUNTER — Other Ambulatory Visit: Payer: Self-pay

## 2019-11-06 ENCOUNTER — Ambulatory Visit (INDEPENDENT_AMBULATORY_CARE_PROVIDER_SITE_OTHER): Payer: 59 | Admitting: Podiatry

## 2019-11-06 ENCOUNTER — Encounter: Payer: Self-pay | Admitting: Podiatry

## 2019-11-06 DIAGNOSIS — Z9889 Other specified postprocedural states: Secondary | ICD-10-CM

## 2019-11-06 DIAGNOSIS — M722 Plantar fascial fibromatosis: Secondary | ICD-10-CM

## 2019-11-06 NOTE — Progress Notes (Signed)
   Subjective:  Patient presents today status post open plantar fasciotomy right foot.. DOS: 09/12/2019.  Patient continues to do very well with minimal to no pain.  She has been walking in tennis shoes with no pain.  No new complaints at this time  Past Medical History:  Diagnosis Date  . Asthma   . Atrophic vaginitis 2009  . BV (bacterial vaginosis) 2005  . Candida vaginitis 2006  . Candida vaginitis 2009  . Depression   . Dysuria 2007  . Female pelvic pain 2006  . Fibroid 2005  . GERD (gastroesophageal reflux disease)   . H/O constipation 2009  . H/O dyspareunia 2008  . H/O urinary frequency 2011  . H/O vaginitis 2005  . Heart murmur   . History of bacterial infection   . History of hemorrhoids 2009  . Hx: UTI (urinary tract infection) 2007  . Hyperlipidemia   . Hypertension   . Irregular periods/menstrual cycles 2007  . Libido, decreased 2006  . Menopausal symptoms 2007  . Nocturia 2009  . Proteinuria 2009  . Sickle cell trait ([place])   . Tenderness of female pelvic organs 2005  . Ulcer   . Vaginal irritation 2011      Objective/Physical Exam Neurovascular status intact.  Skin incision appears to be well coapted and healed.  There is some hyperkeratotic callus tissue noted around the incision site today.  Minimal edema noted to the foot and ankle.  Negative for any pain on palpation throughout the surgical area.  No erythema or edema noted around the incision site.  No clinical evidence of infection.  Assessment: 1. s/p open plantar fasciotomy right foot. DOS: 09/12/2019   Plan of Care:  1. Patient was evaluated.  2.  Light debridement of the hyperkeratotic callus tissue was performed using a chisel blade around the incision site area.  The incision is well-healed 3.  Continue wearing good supportive sneakers 4.  Recommend lotion daily to the surgical area  5.  Return to clinic in 6 weeks for final follow-up  Edrick Kins, DPM [facility]  Dr.  Edrick Kins, [address]                                        [place], [place] [address]                Office [phone]  Fax [phone]

## 2019-12-06 ENCOUNTER — Encounter: Payer: Self-pay | Admitting: Family Medicine

## 2019-12-13 ENCOUNTER — Other Ambulatory Visit: Payer: Self-pay

## 2019-12-17 MED ORDER — MIRTAZAPINE 15 MG PO TABS: ORAL_TABLET | ORAL | 0 refills | Status: DC

## 2019-12-17 NOTE — Telephone Encounter (Signed)
Please ask patient to schedule follow up with me, thanks! Zaliyah Meikle J Kanav Kazmierczak, MD  

## 2019-12-18 ENCOUNTER — Encounter: Payer: 59 | Admitting: Podiatry

## 2019-12-18 NOTE — Telephone Encounter (Signed)
Called patient and she was at work and will call later to schedule an appointment.  Regina Sawyer, [place]

## 2020-01-01 ENCOUNTER — Encounter: Payer: Self-pay | Admitting: Podiatry

## 2020-01-01 ENCOUNTER — Ambulatory Visit (INDEPENDENT_AMBULATORY_CARE_PROVIDER_SITE_OTHER): Payer: 59 | Admitting: Podiatry

## 2020-01-01 ENCOUNTER — Other Ambulatory Visit: Payer: Self-pay

## 2020-01-01 DIAGNOSIS — Z9889 Other specified postprocedural states: Secondary | ICD-10-CM

## 2020-01-01 DIAGNOSIS — M7989 Other specified soft tissue disorders: Secondary | ICD-10-CM

## 2020-01-01 DIAGNOSIS — M722 Plantar fascial fibromatosis: Secondary | ICD-10-CM | POA: Diagnosis not present

## 2020-01-01 MED ORDER — HYDROCODONE-ACETAMINOPHEN 5-325 MG PO TABS: 1.0000 | ORAL_TABLET | Freq: Three times a day (TID) | ORAL | 0 refills | Status: DC | PRN

## 2020-01-06 NOTE — Progress Notes (Signed)
° °  Subjective:  Patient presents today status post open plantar fasciotomy right foot.. DOS: 09/12/2019.  Patient states that since last visit she has had some pain and tenderness to the right foot.  She has increased her ambulation and standing on her feet.  She has been working on her feet as well.  Otherwise she has been working with no new complaints at this time.  Past Medical History:  Diagnosis Date   Asthma    Atrophic vaginitis 2009   BV (bacterial vaginosis) 2005   Candida vaginitis 2006   Candida vaginitis 2009   Depression    Dysuria 2007   Female pelvic pain 2006   Fibroid 2005   GERD (gastroesophageal reflux disease)    H/O constipation 2009   H/O dyspareunia 2008   H/O urinary frequency 2011   H/O vaginitis 2005   Heart murmur    History of bacterial infection    History of hemorrhoids 2009   Hx: UTI (urinary tract infection) 2007   Hyperlipidemia    Hypertension    Irregular periods/menstrual cycles 2007   Libido, decreased 2006   Menopausal symptoms 2007   Nocturia 2009   Proteinuria 2009   Sickle cell trait ([place])    Tenderness of female pelvic organs 2005   Ulcer    Vaginal irritation 2011      Objective/Physical Exam Neurovascular status intact.  Skin incision appears to be well coapted and healed. Minimal edema noted to the foot and ankle.  Negative for any pain on palpation throughout the surgical area.  No erythema or edema noted around the incision site.  No clinical evidence of infection.  Very mild pain on palpation to the medial calcaneal tubercle  Assessment: 1. s/p open plantar fasciotomy right foot. DOS: 09/12/2019   Plan of Care:  1. Patient was evaluated.  2.  Refill prescription for Vicodin 5/325 mg  3.  Today and note for work was provided. 4.  Recommend daily massage to the heel to break up any scar tissue 5.  Patient may now resume full activity no restrictions.  Return to clinic as needed   Edrick Kins, DPM [facility]  Dr. Edrick Kins, [address]                                        [place], [place] [address]                Office [phone]  Fax [phone]

## 2020-01-10 ENCOUNTER — Other Ambulatory Visit: Payer: Self-pay

## 2020-01-10 MED ORDER — ROSUVASTATIN CALCIUM 20 MG PO TABS: 20.0000 mg | ORAL_TABLET | Freq: Every day | ORAL | 1 refills | Status: DC

## 2020-01-13 ENCOUNTER — Encounter: Payer: Self-pay | Admitting: Family Medicine

## 2020-01-13 ENCOUNTER — Ambulatory Visit (INDEPENDENT_AMBULATORY_CARE_PROVIDER_SITE_OTHER): Payer: 59 | Admitting: Family Medicine

## 2020-01-13 ENCOUNTER — Other Ambulatory Visit: Payer: Self-pay

## 2020-01-13 VITALS — BP 136/70 | HR 64 | Ht 66.0 in | Wt 171.4 lb

## 2020-01-13 DIAGNOSIS — Z23 Encounter for immunization: Secondary | ICD-10-CM

## 2020-01-13 DIAGNOSIS — F418 Other specified anxiety disorders: Secondary | ICD-10-CM | POA: Diagnosis not present

## 2020-01-13 DIAGNOSIS — I10 Essential (primary) hypertension: Secondary | ICD-10-CM

## 2020-01-13 DIAGNOSIS — E785 Hyperlipidemia, unspecified: Secondary | ICD-10-CM

## 2020-01-13 NOTE — Patient Instructions (Addendum)
On your way out, schedule an appointment one morning to come back for fasting labs. Do not eat or drink anything other than water the morning of your lab appointment until after your labs are drawn.   Blood pressure looks great Continue current medications Refilled remeron  Call GI doctor about setting up next colonoscopy since it has been 5 years  Flu shot today  Follow up with me in 6 months, sooner if needed  Be well, Dr. Ardelia Mems

## 2020-01-13 NOTE — Progress Notes (Signed)
  Date of Visit: 01/13/2020   SUBJECTIVE:   HPI:  Regina Sawyer presents today for follow up.  She is very frustrated at the start of the visit because I am running late after dealing with an emergency with another patient. She was reportedly yelling at the front desk and cussing, saying derogatory things about me and our practice.  She agreed to proceed with the visit.  Hypertension - taking lisinopril 20mg  daily and carvedilol 6.25mg  twice daily , tolerating well  Mood -t aking lexapro 20mg  daily, toleratin gwell. No SI/HI. Also takes remeron 15mg  at night to help with sleep.  Hyperlipidemia - takes crestor 20mg  daily  OBJECTIVE:   BP 136/70   Pulse 64   Ht 5\' 6"  (1.676 m)   Wt 171 lb 6.4 oz (77.7 kg)   SpO2 99%   BMI 27.66 kg/m  Gen: no acute distress pleasant cooperative HEENT: normocephalic, atraumatic  Heart: regular rate and rhythm, no murmur Lungs: normal work of breathing  Neuro: alert, speech normal Ext: No appreciable lower extremity edema bilaterally   ASSESSMENT/PLAN:   Health maintenance:  -flu shot today -advised she call GI about scheduling another colonoscopy since she is now due for 5 year follow up   HYPERTENSION, BENIGN ESSENTIAL Well controlled. Continue current medication regimen.   Hyperlipidemia Return for fasting lipids. Continue crestor.  Anxiety disorder, unspecified Doing well on lexapro and remeron Weight stable Refilled meds  Discussed with patient at end of visit that she cannot cuss at our staff or make a loud scene at the front desk, regardless of how frustrating the situation may be. I apologized to her for running late and encouraged her to consider scheduling an appointment at the beginning of the clinic session, when things tend to run more on time. Advised her that if this behavior is repeated she may face dismissal from our clinic. I enjoy seeing her and hope it does not come to that. Patient was appropriate and acknowledged she  should not have behaved in this manner.   [place]. Ardelia Mems, [place]

## 2020-01-19 NOTE — Assessment & Plan Note (Signed)
Well controlled. Continue current medication regimen.  

## 2020-01-19 NOTE — Assessment & Plan Note (Signed)
Doing well on lexapro and remeron Weight stable Refilled meds

## 2020-01-19 NOTE — Assessment & Plan Note (Signed)
Return for fasting lipids. Continue crestor.

## 2020-03-20 ENCOUNTER — Encounter: Payer: Self-pay | Admitting: Family Medicine

## 2020-04-02 ENCOUNTER — Other Ambulatory Visit: Payer: Self-pay | Admitting: Family Medicine

## 2020-04-03 ENCOUNTER — Encounter: Payer: Self-pay | Admitting: Family Medicine

## 2020-04-03 NOTE — Telephone Encounter (Signed)
Red team,  Please remind patient to schedule lab visit for fasting labs - she was supposed to do this after our last visit but has not.  Also remind her to call GI office to get set up for her colonoscopy, as she's past due.  Lastly, please offer her (and Dominica Severin) an appointment here for COVID booster.  Thank you!  Leeanne Rio, MD

## 2020-04-06 ENCOUNTER — Other Ambulatory Visit: Payer: Self-pay | Admitting: *Deleted

## 2020-04-06 MED ORDER — CARVEDILOL 6.25 MG PO TABS: 6.2500 mg | ORAL_TABLET | Freq: Two times a day (BID) | ORAL | 1 refills | Status: DC

## 2020-04-06 NOTE — Telephone Encounter (Signed)
Patient scheduled a fasting lab and Covid booster for both she and her husband Dominica Severin.  Patient states that she also wants to have her liver function checked as well as her blood pressure.(Nurse visit on 04/16/2020)  Patient states that she called GI and was told that she was not due for a colonoscopy.  Patient will call again and double check.  Ozella Almond, [place]

## 2020-04-06 NOTE — Telephone Encounter (Signed)
Thanks Shelly! I already had ordered liver function testing for her (great minds think alike). She should be good to go when she comes in for labs.  Leeanne Rio, MD

## 2020-04-16 ENCOUNTER — Ambulatory Visit: Payer: 59

## 2020-04-16 ENCOUNTER — Other Ambulatory Visit: Payer: 59

## 2020-04-22 ENCOUNTER — Other Ambulatory Visit: Payer: 59

## 2020-04-22 ENCOUNTER — Ambulatory Visit (INDEPENDENT_AMBULATORY_CARE_PROVIDER_SITE_OTHER): Payer: 59

## 2020-04-22 ENCOUNTER — Other Ambulatory Visit: Payer: Self-pay

## 2020-04-22 VITALS — BP 130/74 | HR 65

## 2020-04-22 DIAGNOSIS — E785 Hyperlipidemia, unspecified: Secondary | ICD-10-CM

## 2020-04-22 DIAGNOSIS — Z23 Encounter for immunization: Secondary | ICD-10-CM | POA: Diagnosis not present

## 2020-04-22 NOTE — Progress Notes (Signed)
Patient here today for BP check.      Last BP was on 01/13/20 and was 136/70.  BP today is 130/74 with a pulse of 65.    Checked BP in left arm with normal adult cuff.    Symptoms present: none.   Patient last took BP med between 7:00-8:00 am. today.    Patient escorted to lab for labs that were ordered by PCP.    Routed note to PCP.         Veronda Prude, RN

## 2020-04-23 ENCOUNTER — Ambulatory Visit: Payer: 59 | Admitting: Gastroenterology

## 2020-04-23 LAB — CMP14+EGFR
ALT: 24 IU/L (ref 0–32)
AST: 22 IU/L (ref 0–40)
Albumin/Globulin Ratio: 1.4 (ref 1.2–2.2)
Albumin: 4 g/dL (ref 3.8–4.8)
Alkaline Phosphatase: 82 IU/L (ref 44–121)
BUN/Creatinine Ratio: 8 — ABNORMAL LOW (ref 12–28)
BUN: 10 mg/dL (ref 8–27)
Bilirubin Total: <0.2 mg/dL (ref 0.0–1.2)
CO2: 25 mmol/L (ref 20–29)
Calcium: 9.5 mg/dL (ref 8.7–10.3)
Chloride: 102 mmol/L (ref 96–106)
Creatinine, Ser: 1.18 mg/dL — ABNORMAL HIGH (ref 0.57–1.00)
GFR calc Af Amer: 57 mL/min/{1.73_m2} — ABNORMAL LOW (ref >59)
GFR calc non Af Amer: 50 mL/min/{1.73_m2} — ABNORMAL LOW (ref >59)
Globulin, Total: 2.9 g/dL (ref 1.5–4.5)
Glucose: 99 mg/dL (ref 65–99)
Potassium: 4.4 mmol/L (ref 3.5–5.2)
Sodium: 140 mmol/L (ref 134–144)
Total Protein: 6.9 g/dL (ref 6.0–8.5)

## 2020-04-23 LAB — LIPID PANEL
Chol/HDL Ratio: 2.3 ratio (ref 0.0–4.4)
Cholesterol, Total: 131 mg/dL (ref 100–199)
HDL: 56 mg/dL (ref >39)
LDL Chol Calc (NIH): 62 mg/dL (ref 0–99)
Triglycerides: 63 mg/dL (ref 0–149)
VLDL Cholesterol Cal: 13 mg/dL (ref 5–40)

## 2020-04-24 NOTE — Progress Notes (Signed)
   Covid-19 Vaccination Clinic  Name:  Regina Sawyer    MRN: [id number] DOB: 09-17-1955  04/22/2020  Patient presents to nurse clinic for [place] booster. Administered in LD, site unremarkable, tolerated injection well.   [name] was observed post Covid-19 immunization for 15 minutes without incident. She was provided with Vaccine Information Sheet and instruction to access the V-Safe system.   [name] was instructed to call 911 with any severe reactions post vaccine: Marland Kitchen Difficulty breathing  . Swelling of face and throat  . A fast heartbeat  . A bad rash all over body  . Dizziness and weakness    Provided patient with updated immunization record and card.   Talbot Grumbling, RN

## 2020-04-27 ENCOUNTER — Ambulatory Visit: Payer: 59 | Admitting: Podiatry

## 2020-04-28 ENCOUNTER — Telehealth: Payer: Self-pay

## 2020-04-28 NOTE — Telephone Encounter (Signed)
Patient LVM on nurse line for results of recent lab work. Please advise.

## 2020-04-30 ENCOUNTER — Encounter: Payer: Self-pay | Admitting: Family Medicine

## 2020-04-30 ENCOUNTER — Ambulatory Visit (INDEPENDENT_AMBULATORY_CARE_PROVIDER_SITE_OTHER): Payer: 59 | Admitting: Family Medicine

## 2020-04-30 ENCOUNTER — Other Ambulatory Visit: Payer: Self-pay

## 2020-04-30 VITALS — HR 63

## 2020-04-30 DIAGNOSIS — B349 Viral infection, unspecified: Secondary | ICD-10-CM | POA: Insufficient documentation

## 2020-04-30 NOTE — Assessment & Plan Note (Addendum)
Cough/congestion with generalized myalgias following recent [place] booster vaccine that is now since resolved.  Suspect likely related to booster side effects in addition to may have had a viral illness given duration of symptoms.  Could certainly consider COVID 19 illness, she technically is now day 8 since symptom onset and would complete quarantine in the next 2 days.  Through shared decision-making, decided to continue quarantine at least for the next 2 days without testing as to avoid unnecessary resource/expense since her symptoms have now resolved.

## 2020-04-30 NOTE — Patient Instructions (Signed)
Wonderful to see you! Continue tylenol as needed for muscle aches, drink plenty of water, and take warm showers with steam to help with congestion.   Continue to stay at home through Saturday with quarantine until his results return. If any difficulty breathing or chest pain--please go to the ED.

## 2020-04-30 NOTE — Progress Notes (Signed)
    SUBJECTIVE:   CHIEF COMPLAINT / HPI: Cough, recent booster vaccine  Regina Sawyer is a 65 year old female presenting for evaluation of cough/congestion that is now resolved. She is accompanied by her husband who also is being evaluated for similar symptoms.   Received her [place] booster on 04/23/2019 and reports the day after she started experiencing some symptoms. States she started to have some dry cough, muscle soreness at injection site and generalized. Started feeling better by the 11th.  States she now feels back at her baseline and has no continued symptoms.  She stays at home most of the time and does not currently work, has essentially been quarantining since symptoms start.  She denies any difficulty breathing, chest pain, weakness/numbness, or leg swelling.  No known COVID contacts.  PERTINENT  PMH / PSH: HTN, sickle cell trait, iron deficiency anemia, anxiety   OBJECTIVE:   Pulse 63   SpO2 97%   General: Alert, NAD, well appearing  HEENT: NCAT, MMM, oropharynx non-erythematous, bilateral TM clear with appropriate light reflex, no cervical adenopathy present bilaterally Cardiac: RRR  Lungs: Clear bilaterally, no increased WOB, good air exchange with no wheezing or rhonchi noted Abdomen: soft Msk: Moves all extremities spontaneously  Ext: Warm, dry, 2+ distal pulses   ASSESSMENT/PLAN:   Viral illness Cough/congestion with generalized myalgias following recent Pf[employer] booster vaccine that is now since resolved.  Suspect likely related to booster side effects in addition to may have had a viral illness given duration of symptoms.  Could certainly consider COVID 19 illness, she technically is now day 8 since symptom onset and would complete quarantine in the next 2 days.  Through shared decision-making, decided to continue quarantine at least for the next 2 days without testing in the office today as to avoid unnecessary resource/expense since her symptoms have now resolved/has not  been around anyone other than her husband.     Follow-up if symptoms recurrent, otherwise recommended routine follow-up with PCP.  Patriciaann Clan, [place]

## 2020-04-30 NOTE — Telephone Encounter (Signed)
Called patient and advised of good results. Will also send her a letter. Patient appreciative. Leeanne Rio, MD

## 2020-05-02 ENCOUNTER — Telehealth: Payer: Self-pay | Admitting: Family Medicine

## 2020-05-02 NOTE — Telephone Encounter (Signed)
Patient's husband tested positive for COVID during concurrent visit on 1/13. Called to discuss with both her and her spouse this morning. Misunderstood patient during visit, reports during conversation that she does actually work for [employer] in [employer]. 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  Discussed that I strongly suspect given her and her spouses symptom onset, that she likely also has had COVID and would now be on day 10 of illness/completing quarantine. Reports that she still feel "great" and back to her normal since Wednesday as discussed during office note 1/13. However, may still be helpful to proceed with COVID testing to ensure. Recommended [facility] COVID testing sites over the weekend. Will check availability in our office due to limited supply on Monday.  Patriciaann Clan, DO

## 2020-05-26 ENCOUNTER — Ambulatory Visit (AMBULATORY_SURGERY_CENTER): Payer: Self-pay | Admitting: *Deleted

## 2020-05-26 ENCOUNTER — Other Ambulatory Visit: Payer: Self-pay

## 2020-05-26 VITALS — Ht 62.0 in | Wt 159.0 lb

## 2020-05-26 DIAGNOSIS — Z86010 Personal history of colonic polyps: Secondary | ICD-10-CM

## 2020-05-26 MED ORDER — SUPREP BOWEL PREP KIT 17.5-3.13-1.6 GM/177ML PO SOLN: 1.0000 | Freq: Once | ORAL | 0 refills | Status: AC

## 2020-05-26 NOTE — Progress Notes (Signed)

## 2020-05-27 ENCOUNTER — Ambulatory Visit (INDEPENDENT_AMBULATORY_CARE_PROVIDER_SITE_OTHER): Payer: 59 | Admitting: Podiatry

## 2020-05-27 ENCOUNTER — Ambulatory Visit (INDEPENDENT_AMBULATORY_CARE_PROVIDER_SITE_OTHER): Payer: 59

## 2020-05-27 DIAGNOSIS — M722 Plantar fascial fibromatosis: Secondary | ICD-10-CM | POA: Diagnosis not present

## 2020-05-27 DIAGNOSIS — Z9889 Other specified postprocedural states: Secondary | ICD-10-CM | POA: Diagnosis not present

## 2020-05-27 MED ORDER — MELOXICAM 15 MG PO TABS: 15.0000 mg | ORAL_TABLET | Freq: Every day | ORAL | 1 refills | Status: DC

## 2020-05-27 NOTE — Patient Instructions (Signed)
Voltaren (diclofenac) gel topical available at any pharmacy.

## 2020-06-08 ENCOUNTER — Other Ambulatory Visit: Payer: Self-pay | Admitting: Family Medicine

## 2020-06-09 ENCOUNTER — Encounter: Payer: Self-pay | Admitting: Gastroenterology

## 2020-06-09 ENCOUNTER — Ambulatory Visit (AMBULATORY_SURGERY_CENTER): Payer: 59 | Admitting: Gastroenterology

## 2020-06-09 ENCOUNTER — Other Ambulatory Visit: Payer: Self-pay

## 2020-06-09 VITALS — BP 137/71 | HR 61 | Temp 97.2°F | Resp 14 | Ht 62.0 in | Wt 159.0 lb

## 2020-06-09 DIAGNOSIS — D123 Benign neoplasm of transverse colon: Secondary | ICD-10-CM | POA: Diagnosis not present

## 2020-06-09 DIAGNOSIS — D125 Benign neoplasm of sigmoid colon: Secondary | ICD-10-CM | POA: Diagnosis not present

## 2020-06-09 DIAGNOSIS — Z86010 Personal history of colonic polyps: Secondary | ICD-10-CM | POA: Diagnosis not present

## 2020-06-09 MED ORDER — SODIUM CHLORIDE 0.9 % IV SOLN: 500.0000 mL | Freq: Once | INTRAVENOUS | Status: DC

## 2020-06-09 NOTE — Patient Instructions (Signed)
Please read handouts provided. Continue present medications. Await pathology results.   YOU HAD AN ENDOSCOPIC PROCEDURE TODAY AT THE [facility]:   Refer to the procedure report that was given to you for any specific questions about what was found during the examination.  If the procedure report does not answer your questions, please call your gastroenterologist to clarify.  If you requested that your care partner not be given the details of your procedure findings, then the procedure report has been included in a sealed envelope for you to review at your convenience later.  YOU SHOULD EXPECT: Some feelings of bloating in the abdomen. Passage of more gas than usual.  Walking can help get rid of the air that was put into your GI tract during the procedure and reduce the bloating. If you had a lower endoscopy (such as a colonoscopy or flexible sigmoidoscopy) you may notice spotting of blood in your stool or on the toilet paper. If you underwent a bowel prep for your procedure, you may not have a normal bowel movement for a few days.  Please Note:  You might notice some irritation and congestion in your nose or some drainage.  This is from the oxygen used during your procedure.  There is no need for concern and it should clear up in a day or so.  SYMPTOMS TO REPORT IMMEDIATELY:  Following lower endoscopy (colonoscopy or flexible sigmoidoscopy):  Excessive amounts of blood in the stool  Significant tenderness or worsening of abdominal pains  Swelling of the abdomen that is new, acute  Fever of 100F or higher   For urgent or emergent issues, a gastroenterologist can be reached at any hour by calling [phone]. Do not use MyChart messaging for urgent concerns.    DIET:  We do recommend a small meal at first, but then you may proceed to your regular diet.  Drink plenty of fluids but you should avoid alcoholic beverages for 24 hours.  ACTIVITY:  You should plan to take it easy  for the rest of today and you should NOT DRIVE or use heavy machinery until tomorrow (because of the sedation medicines used during the test).    FOLLOW UP: Our staff will call the number listed on your records 48-72 hours following your procedure to check on you and address any questions or concerns that you may have regarding the information given to you following your procedure. If we do not reach you, we will leave a message.  We will attempt to reach you two times.  During this call, we will ask if you have developed any symptoms of COVID 19. If you develop any symptoms (ie: fever, flu-like symptoms, shortness of breath, cough etc.) before then, please call [phone].  If you test positive for Covid 19 in the 2 weeks post procedure, please call and report this information to us.    If any biopsies were taken you will be contacted by phone or by letter within the next 1-3 weeks.  Please call us at [phone] if you have not heard about the biopsies in 3 weeks.    SIGNATURES/CONFIDENTIALITY: You and/or your care partner have signed paperwork which will be entered into your electronic medical record.  These signatures attest to the fact that that the information above on your After Visit Summary has been reviewed and is understood.  Full responsibility of the confidentiality of this discharge information lies with you and/or your care-partner.  

## 2020-06-09 NOTE — Progress Notes (Signed)
Called to room to assist during endoscopic procedure.  Patient ID and intended procedure confirmed with present staff. Received instructions for my participation in the procedure from the performing physician.  

## 2020-06-09 NOTE — Progress Notes (Signed)
PT taken to PACU. Monitors in place. VSS. Report given to RN. 

## 2020-06-09 NOTE — Op Note (Signed)
[facility] Patient Name: Regina Sawyer Procedure Date: 06/09/2020 1:39 PM MRN: [id number] Endoscopist: Remo Lipps P. Havery Moros , MD Age: 65 Referring MD:  Date of Birth: 1956-02-15 Gender: Female Account #: [id number] Procedure:                Colonoscopy Indications:              High risk colon cancer surveillance: Personal                            history of colonic polyps (2 adenomas removed                            2016), father with colon cancer (patient unclear                            age of diagnosis) Medicines:                Monitored Anesthesia Care Procedure:                Pre-Anesthesia Assessment:                           - Prior to the procedure, a History and Physical                            was performed, and patient medications and                            allergies were reviewed. The patient's tolerance of                            previous anesthesia was also reviewed. The risks                            and benefits of the procedure and the sedation                            options and risks were discussed with the patient.                            All questions were answered, and informed consent                            was obtained. Prior Anticoagulants: The patient has                            taken no previous anticoagulant or antiplatelet                            agents. ASA Grade Assessment: II - A patient with                            mild systemic disease. After reviewing the risks  and benefits, the patient was deemed in                            satisfactory condition to undergo the procedure.                           After obtaining informed consent, the colonoscope                            was passed under direct vision. Throughout the                            procedure, the patient's blood pressure, pulse, and                            oxygen saturations were monitored continuously.  The                            Olympus PCF-H190DL [id number]) Colonoscope was                            introduced through the anus and advanced to the the                            cecum, identified by appendiceal orifice and                            ileocecal valve. The colonoscopy was performed                            without difficulty. The patient tolerated the                            procedure well. The quality of the bowel                            preparation was good. The ileocecal valve,                            appendiceal orifice, and rectum were photographed. Scope In: 1:46:28 PM Scope Out: 2:03:23 PM Scope Withdrawal Time: 0 hours 14 minutes 53 seconds  Total Procedure Duration: 0 hours 16 minutes 55 seconds  Findings:                 The perianal and digital rectal examinations were                            normal.                           A diminutive polyp was found in the transverse                            colon. The polyp was sessile. The polyp was removed  with a cold snare. Resection and retrieval were                            complete.                           A diminutive polyp was found in the sigmoid colon.                            The polyp was sessile. The polyp was removed with a                            cold snare. Resection and retrieval were complete.                           The exam was otherwise normal throughout the                            examined colon. Complications:            No immediate complications. Estimated blood loss:                            Minimal. Estimated Blood Loss:     Estimated blood loss was minimal. Impression:               - One diminutive polyp in the transverse colon,                            removed with a cold snare. Resected and retrieved.                           - One diminutive polyp in the sigmoid colon,                            removed with a cold snare.  Resected and retrieved.                           - Normal colon otherwise Recommendation:           - Patient has a contact number available for                            emergencies. The signs and symptoms of potential                            delayed complications were discussed with the                            patient. Return to normal activities tomorrow.                            Written discharge instructions were provided to the  patient.                           - Resume previous diet.                           - Continue present medications.                           - Await pathology results. Remo Lipps P. Havery Moros, MD 06/09/2020 2:07:48 PM This report has been signed electronically.

## 2020-06-09 NOTE — Progress Notes (Signed)
VS by CW.  previsit with EM.  Pt states no changes to health hx since previsit

## 2020-06-11 ENCOUNTER — Encounter: Payer: Self-pay | Admitting: Gastroenterology

## 2020-06-11 ENCOUNTER — Telehealth: Payer: Self-pay

## 2020-06-11 ENCOUNTER — Telehealth: Payer: Self-pay | Admitting: Gastroenterology

## 2020-06-11 NOTE — Telephone Encounter (Signed)
Okay. To clarify, did her father have colon cancer at all? If he did have colon cancer, does she know the age at which he was diagnosed? If he did not have colon cancer we will remove it from her family history. Thanks

## 2020-06-11 NOTE — Telephone Encounter (Signed)
Spoke with patient, she states that her father did not have colon cancer at all. Removed from family history.

## 2020-06-11 NOTE — Telephone Encounter (Signed)
LVM

## 2020-06-15 NOTE — Progress Notes (Signed)
   Subjective:  Patient presents today status post open plantar fasciotomy right foot.. DOS: 09/12/2019.  Patient states that she is having some recurrent heel pain and swelling to the surgical area.  This has slowly increased over the last week or 2.  She presents for further treatment and evaluation  Past Medical History:  Diagnosis Date  . Allergy   . Anxiety   . Asthma   . Atrophic vaginitis 2009  . BV (bacterial vaginosis) 2005  . Candida vaginitis 2006  . Candida vaginitis 2009  . Depression   . Dysuria 2007  . Female pelvic pain 2006  . Fibroid 2005  . GERD (gastroesophageal reflux disease)   . H/O constipation 2009  . H/O dyspareunia 2008  . H/O urinary frequency 2011  . H/O vaginitis 2005  . Heart murmur   . History of bacterial infection   . History of hemorrhoids 2009  . Hx: UTI (urinary tract infection) 2007  . Hyperlipidemia    controlled   . Hypertension    controlled   . Irregular periods/menstrual cycles 2007  . Libido, decreased 2006  . Menopausal symptoms 2007  . Nocturia 2009  . Proteinuria 2009  . Sickle cell trait ([place])    pt denies Sickle cell trait or disease 05-26-2020  . Tenderness of female pelvic organs 2005  . Ulcer   . Vaginal irritation 2011      Objective/Physical Exam Neurovascular status intact.  Skin incision appears to be well coapted and healed. Minimal edema noted to the foot and ankle.  Negative for any pain on palpation throughout the surgical area.  No erythema or edema noted around the incision site.  No clinical evidence of infection.  Very mild pain on palpation to the medial calcaneal tubercle  Radiographic exam Normal osseous mineralization.  Joint spaces preserved.  No fracture identified.  Assessment: 1. s/p open plantar fasciotomy right foot. DOS: 09/12/2019   Plan of Care:  1. Patient was evaluated.  2.  Recommend OTC Voltaren 1% topical apply 3-4 times daily 3.  Refill prescription for meloxicam 15 mg daily 4.   Recommend good supportive shoes and arch supports 5.  Return to clinic as needed  Edrick Kins, DPM [facility]  Dr. Edrick Kins, DPM    [address], [place] [address]                Office [phone]  Fax [phone]

## 2020-06-18 ENCOUNTER — Encounter: Payer: Self-pay | Admitting: Gastroenterology

## 2020-06-21 ENCOUNTER — Other Ambulatory Visit: Payer: Self-pay | Admitting: Family Medicine

## 2020-06-22 ENCOUNTER — Other Ambulatory Visit: Payer: Self-pay

## 2020-06-23 MED ORDER — ROSUVASTATIN CALCIUM 20 MG PO TABS: 20.0000 mg | ORAL_TABLET | Freq: Every day | ORAL | 1 refills | Status: DC

## 2020-07-03 ENCOUNTER — Other Ambulatory Visit: Payer: Self-pay | Admitting: Podiatry

## 2020-07-03 ENCOUNTER — Other Ambulatory Visit: Payer: Self-pay | Admitting: Family Medicine

## 2020-07-20 ENCOUNTER — Other Ambulatory Visit: Payer: Self-pay | Admitting: Family Medicine

## 2020-08-14 ENCOUNTER — Other Ambulatory Visit: Payer: Self-pay

## 2020-08-14 ENCOUNTER — Ambulatory Visit (INDEPENDENT_AMBULATORY_CARE_PROVIDER_SITE_OTHER): Payer: 59 | Admitting: Physician Assistant

## 2020-08-14 ENCOUNTER — Encounter: Payer: Self-pay | Admitting: Physician Assistant

## 2020-08-14 ENCOUNTER — Ambulatory Visit: Payer: 59 | Admitting: Podiatry

## 2020-08-14 DIAGNOSIS — M722 Plantar fascial fibromatosis: Secondary | ICD-10-CM | POA: Diagnosis not present

## 2020-08-14 MED ORDER — ROSUVASTATIN CALCIUM 20 MG PO TABS: 20.0000 mg | ORAL_TABLET | Freq: Every day | ORAL | 1 refills | Status: DC

## 2020-08-14 NOTE — Progress Notes (Signed)
Office Visit Note   Patient: Regina Sawyer           Date of Birth: 1955-06-23           MRN: [id number] Visit Date: 08/14/2020              Requested by: Leeanne Rio, [place],  [place] [address] PCP: Leeanne Rio, MD  No chief complaint on file.     HPI: Patient is a pleasant 65 year old woman who presents with a chief complaint of right foot pain most notably in the medial side of her heel.  She was last seen by Dr. Sharol Given in 2020 at which time she was status post gastrocnemius recession and plantar fascial release.  She struggled with a painful scar.  This was injected.  She does not think it helped very much.  She subsequently was followed by Dr. Amalia Hailey with podiatry.  She underwent an open plantar fasciotomy on her right foot in May 2021.  She said her foot has gotten worse.  She notes her pain to be mostly over the medial side of the heel but also has pain going around her ankle and laterally.  Assessment & Plan: Visit Diagnoses:  1. Plantar fasciitis of right foot     Plan: Patient does not desire any injections.  She cannot take oral anti-inflammatories because she has a history of an ulcer.  We discussed trying a course of physical therapy with some modalities and she will continue with Voltaren.  She spends a lot of her time on her feet with her job and I have recommended [employer] shoes and she is going to look into them.  Follow-up in 3 weeks with Dr. Sharol Given  Follow-Up Instructions: No follow-ups on file.   Ortho Exam  Patient is alert, oriented, no adenopathy, well-dressed, normal affect, normal respiratory effort. Right foot.  Palpable dorsalis pedis pulse.  She has hypertrophic scarring of the medial scar.  She is very tender to palpation over the plantar heel.  She has good dorsiflexion plantarflexion ED eversion and inversion less tenderness laterally.  No tenderness with ankle range of motion.  She does have an antalgic gait walking in  varus on the side of her foot to avoid floor contact with her heel no cellulitis or signs of infection  Imaging: No results found. No images are attached to the encounter.  Labs: Lab Results  Component Value Date   HGBA1C 5.7 07/19/2016   HGBA1C 5.2 03/30/2015   HGBA1C 5.7 05/12/2014   LABORGA Multiple bacterial morphotypes present, none 02/23/2012   LABORGA predominant. Suggest appropriate recollection if  02/23/2012   LABORGA clinically indicated. 02/23/2012     Lab Results  Component Value Date   ALBUMIN 4.0 04/22/2020   ALBUMIN 4.4 12/04/2018   ALBUMIN 3.8 08/08/2017    No results found for: MG No results found for: VD25OH  No results found for: PREALBUMIN CBC EXTENDED Latest Ref Rng & Units 04/19/2016 07/23/2014 08/05/2009  WBC 4.0 - 10.5 K/uL - 4.0 5.2  RBC 3.87 - 5.11 MIL/uL - 4.38 4.14  HGB 12.0 - 15.0 g/dL 11.9(L) 11.8(L) 11.1(L)  HCT 36.0 - 46.0 % 35.0(L) 37.4 35.0(L)  PLT 150 - 400 K/uL - 380 389  NEUTROABS 1.7 - 7.7 K/uL - 2.5 -  LYMPHSABS 0.7 - 4.0 K/uL - 1.1 -     There is no height or weight on file to calculate BMI.  Orders:  Orders Placed This Encounter  Procedures  . Ambulatory referral to Physical Therapy   No orders of the defined types were placed in this encounter.    Procedures: No procedures performed  Clinical Data: No additional findings.  ROS:  All other systems negative, except as noted in the HPI. Review of Systems  Objective: Vital Signs: There were no vitals taken for this visit.  Specialty Comments:  No specialty comments available.  PMFS History: Patient Active Problem List   Diagnosis Date Noted  . Viral illness 04/30/2020  . Trigger finger of right hand 08/27/2019  . Dysuria 07/11/2018  . Nocturia 07/11/2018  . Vitamin D deficiency 07/11/2018  . Rectal bleeding 01/04/2018  . Other constipation 01/04/2018  . Anxiety disorder, unspecified 01/03/2017  . Sleep concern 06/16/2016  . Right ear pain 04/22/2016  .  Cough 04/22/2016  . Hypercalcemia 01/14/2016  . Erythema and swelling of lower extremity 09/07/2015  . Right foot pain 12/09/2014  . Benign neoplasm of ascending colon 10/28/2014  . Internal hemorrhoids 10/28/2014  . Vulvar lesion 10/07/2014  . Abdominal pain, epigastric 07/10/2014  . Dry mouth 07/10/2014  . Weight loss 06/04/2014  . Prediabetes 05/13/2014  . Elevated serum creatinine 05/12/2014  . Injury of left index finger 01/23/2014  . Arm pain, left 01/23/2014  . Cervical polyp 10/14/2013  . Vaginal itching 11/03/2012  . Cystitis 10/16/2012  . Genital herpes simplex 03/11/2010  . Hyperlipidemia 08/26/2009  . VAGINITIS, ATROPHIC 05/08/2008  . POSTMENOPAUSAL BLEEDING 11/29/2007  . SYSTOLIC MURMUR 24/40/1027  . HYPERTENSION, BENIGN ESSENTIAL 02/05/2007  . ANEMIA, IRON DEFICIENCY, UNSPEC. 06/15/2006  . SICKLE CELL TRAIT 06/15/2006  . RHINITIS, ALLERGIC 06/15/2006  . GASTROESOPHAGEAL REFLUX, NO ESOPHAGITIS 06/15/2006   Past Medical History:  Diagnosis Date  . Allergy   . Anxiety   . Asthma   . Atrophic vaginitis 2009  . BV (bacterial vaginosis) 2005  . Candida vaginitis 2006  . Candida vaginitis 2009  . Depression   . Dysuria 2007  . Female pelvic pain 2006  . Fibroid 2005  . GERD (gastroesophageal reflux disease)   . H/O constipation 2009  . H/O dyspareunia 2008  . H/O urinary frequency 2011  . H/O vaginitis 2005  . Heart murmur   . History of bacterial infection   . History of hemorrhoids 2009  . Hx: UTI (urinary tract infection) 2007  . Hyperlipidemia    controlled   . Hypertension    controlled   . Irregular periods/menstrual cycles 2007  . Libido, decreased 2006  . Menopausal symptoms 2007  . Nocturia 2009  . Proteinuria 2009  . Sickle cell trait ([place])    pt denies Sickle cell trait or disease 05-26-2020  . Tenderness of female pelvic organs 2005  . Ulcer   . Vaginal irritation 2011    Family History  Problem Relation Age of Onset  . Diabetes  Sister   . Hypertension Sister   . Heart failure Father   . Heart failure Brother        x2  . Gallstones Mother   . Colon polyps Neg Hx   . Kidney disease Neg Hx   . Esophageal cancer Neg Hx   . Gallbladder disease Neg Hx   . Rectal cancer Neg Hx   . Stomach cancer Neg Hx     Past Surgical History:  Procedure Laterality Date  . CARPAL TUNNEL RELEASE     both wrists  . COLONOSCOPY N/A 10/28/2014   Procedure: COLONOSCOPY;  Surgeon: Inda Castle, MD;  Location: [facility];  Service: Endoscopy;  Laterality: N/A;  . COLONOSCOPY    . ESOPHAGOGASTRODUODENOSCOPY N/A 08/06/2014   Procedure: ESOPHAGOGASTRODUODENOSCOPY (EGD);  Surgeon: Inda Castle, MD;  Location: [facility];  Service: Endoscopy;  Laterality: N/A;  . HEEL SPUR SURGERY Right   . POLYPECTOMY    . TUBAL LIGATION    . UPPER GASTROINTESTINAL ENDOSCOPY     Social History   Occupational History  . Occupation: Psychologist, educational  Tobacco Use  . Smoking status: Passive Smoke Exposure - Never Smoker  . Smokeless tobacco: Never Used  Vaping Use  . Vaping Use: Never used  Substance and Sexual Activity  . Alcohol use: No  . Drug use: No  . Sexual activity: Not on file    Comment: BTL

## 2020-09-04 ENCOUNTER — Ambulatory Visit (INDEPENDENT_AMBULATORY_CARE_PROVIDER_SITE_OTHER): Payer: 59 | Admitting: Physician Assistant

## 2020-09-04 ENCOUNTER — Encounter: Payer: Self-pay | Admitting: Physician Assistant

## 2020-09-04 DIAGNOSIS — M722 Plantar fascial fibromatosis: Secondary | ICD-10-CM | POA: Diagnosis not present

## 2020-09-04 NOTE — Progress Notes (Signed)
Office Visit Note   Patient: Regina Sawyer           Date of Birth: 1955-09-17           MRN: [id number] Visit Date: 09/04/2020              Requested by: Leeanne Rio, MD [address] [place],   [address] PCP: Leeanne Rio, MD  Chief Complaint  Patient presents with  . Right Foot - Follow-up      HPI: Patient presents in follow-up today for her right heel.  She has a history of right plantar fasciitis.  She had plantar fascia surgery with a podiatrist.  She is continued to have sensitivity around the scar.  This has been injected without relief.  She has obtained more supportive shoes but still continues to have problems.  At her last visit I had recommended modalities and physical therapy she has not yet addressed this.  I had recommended anti-inflammatories both topically and orally.  She cannot take oral anti-inflammatories because of a history of an ulcer  Assessment & Plan: Visit Diagnoses: No diagnosis found.  Plan: I would like for her to revisit and get in with physical therapy follow-up in 4 weeks.  Follow-Up Instructions: No follow-ups on file.   Ortho Exam  Patient is alert, oriented, no adenopathy, well-dressed, normal affect, normal respiratory effort. Medial plantar scar status post plantar fascial release.  She is tender to palpation of this area even light touch.  No surrounding erythema no swelling no signs of infection  Imaging: No results found. No images are attached to the encounter.  Labs: Lab Results  Component Value Date   HGBA1C 5.7 07/19/2016   HGBA1C 5.2 03/30/2015   HGBA1C 5.7 05/12/2014   LABORGA Multiple bacterial morphotypes present, none 02/23/2012   LABORGA predominant. Suggest appropriate recollection if  02/23/2012   LABORGA clinically indicated. 02/23/2012     Lab Results  Component Value Date   ALBUMIN 4.0 04/22/2020   ALBUMIN 4.4 12/04/2018   ALBUMIN 3.8 08/08/2017    No results found for:  MG No results found for: VD25OH  No results found for: PREALBUMIN CBC EXTENDED Latest Ref Rng & Units 04/19/2016 07/23/2014 08/05/2009  WBC 4.0 - 10.5 K/uL - 4.0 5.2  RBC 3.87 - 5.11 MIL/uL - 4.38 4.14  HGB 12.0 - 15.0 g/dL 11.9(L) 11.8(L) 11.1(L)  HCT 36.0 - 46.0 % 35.0(L) 37.4 35.0(L)  PLT 150 - 400 K/uL - 380 389  NEUTROABS 1.7 - 7.7 K/uL - 2.5 -  LYMPHSABS 0.7 - 4.0 K/uL - 1.1 -     There is no height or weight on file to calculate BMI.  Orders:  No orders of the defined types were placed in this encounter.  No orders of the defined types were placed in this encounter.    Procedures: No procedures performed  Clinical Data: No additional findings.  ROS:  All other systems negative, except as noted in the HPI. Review of Systems  Objective: Vital Signs: There were no vitals taken for this visit.  Specialty Comments:  No specialty comments available.  PMFS History: Patient Active Problem List   Diagnosis Date Noted  . Viral illness 04/30/2020  . Trigger finger of right hand 08/27/2019  . Dysuria 07/11/2018  . Nocturia 07/11/2018  . Vitamin D deficiency 07/11/2018  . Rectal bleeding 01/04/2018  . Other constipation 01/04/2018  . Anxiety disorder, unspecified 01/03/2017  . Sleep concern 06/16/2016  . Right ear  pain 04/22/2016  . Cough 04/22/2016  . Hypercalcemia 01/14/2016  . Erythema and swelling of lower extremity 09/07/2015  . Right foot pain 12/09/2014  . Benign neoplasm of ascending colon 10/28/2014  . Internal hemorrhoids 10/28/2014  . Vulvar lesion 10/07/2014  . Abdominal pain, epigastric 07/10/2014  . Dry mouth 07/10/2014  . Weight loss 06/04/2014  . Prediabetes 05/13/2014  . Elevated serum creatinine 05/12/2014  . Injury of left index finger 01/23/2014  . Arm pain, left 01/23/2014  . Cervical polyp 10/14/2013  . Vaginal itching 11/03/2012  . Cystitis 10/16/2012  . Genital herpes simplex 03/11/2010  . Hyperlipidemia 08/26/2009  . VAGINITIS,  ATROPHIC 05/08/2008  . POSTMENOPAUSAL BLEEDING 11/29/2007  . SYSTOLIC MURMUR 19/41/7408  . HYPERTENSION, BENIGN ESSENTIAL 02/05/2007  . ANEMIA, IRON DEFICIENCY, UNSPEC. 06/15/2006  . SICKLE CELL TRAIT 06/15/2006  . RHINITIS, ALLERGIC 06/15/2006  . GASTROESOPHAGEAL REFLUX, NO ESOPHAGITIS 06/15/2006   Past Medical History:  Diagnosis Date  . Allergy   . Anxiety   . Asthma   . Atrophic vaginitis 2009  . BV (bacterial vaginosis) 2005  . Candida vaginitis 2006  . Candida vaginitis 2009  . Depression   . Dysuria 2007  . Female pelvic pain 2006  . Fibroid 2005  . GERD (gastroesophageal reflux disease)   . H/O constipation 2009  . H/O dyspareunia 2008  . H/O urinary frequency 2011  . H/O vaginitis 2005  . Heart murmur   . History of bacterial infection   . History of hemorrhoids 2009  . Hx: UTI (urinary tract infection) 2007  . Hyperlipidemia    controlled   . Hypertension    controlled   . Irregular periods/menstrual cycles 2007  . Libido, decreased 2006  . Menopausal symptoms 2007  . Nocturia 2009  . Proteinuria 2009  . Sickle cell trait ([place])    pt denies Sickle cell trait or disease 05-26-2020  . Tenderness of female pelvic organs 2005  . Ulcer   . Vaginal irritation 2011    Family History  Problem Relation Age of Onset  . Diabetes Sister   . Hypertension Sister   . Heart failure Father   . Heart failure Brother        x2  . Gallstones Mother   . Colon polyps Neg Hx   . Kidney disease Neg Hx   . Esophageal cancer Neg Hx   . Gallbladder disease Neg Hx   . Rectal cancer Neg Hx   . Stomach cancer Neg Hx     Past Surgical History:  Procedure Laterality Date  . CARPAL TUNNEL RELEASE     both wrists  . COLONOSCOPY N/A 10/28/2014   Procedure: COLONOSCOPY;  Surgeon: Inda Castle, MD;  Location: [facility];  Service: Endoscopy;  Laterality: N/A;  . COLONOSCOPY    . ESOPHAGOGASTRODUODENOSCOPY N/A 08/06/2014   Procedure: ESOPHAGOGASTRODUODENOSCOPY (EGD);   Surgeon: Inda Castle, MD;  Location: [facility];  Service: Endoscopy;  Laterality: N/A;  . HEEL SPUR SURGERY Right   . POLYPECTOMY    . TUBAL LIGATION    . UPPER GASTROINTESTINAL ENDOSCOPY     Social History   Occupational History  . Occupation: Psychologist, educational  Tobacco Use  . Smoking status: Passive Smoke Exposure - Never Smoker  . Smokeless tobacco: Never Used  Vaping Use  . Vaping Use: Never used  Substance and Sexual Activity  . Alcohol use: No  . Drug use: No  . Sexual activity: Not on file    Comment: BTL

## 2020-09-09 ENCOUNTER — Other Ambulatory Visit: Payer: Self-pay | Admitting: Podiatry

## 2020-09-09 ENCOUNTER — Other Ambulatory Visit: Payer: Self-pay

## 2020-09-09 ENCOUNTER — Ambulatory Visit (INDEPENDENT_AMBULATORY_CARE_PROVIDER_SITE_OTHER): Payer: 59

## 2020-09-09 ENCOUNTER — Ambulatory Visit (INDEPENDENT_AMBULATORY_CARE_PROVIDER_SITE_OTHER): Payer: 59 | Admitting: Podiatry

## 2020-09-09 DIAGNOSIS — M722 Plantar fascial fibromatosis: Secondary | ICD-10-CM

## 2020-09-09 MED ORDER — TRAMADOL HCL 50 MG PO TABS: 50.0000 mg | ORAL_TABLET | Freq: Four times a day (QID) | ORAL | 0 refills | Status: AC | PRN

## 2020-09-09 NOTE — Progress Notes (Signed)
    Subjective: 65 y.o. female presenting today for increased heel pain to the right foot.  Patient does have history of open plantar fasciotomy right foot.  DOS: 09/12/2019.  She states that over the last few months she has had increased pain and tenderness.  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  She presents for further treatment and evaluation   Past Medical History:  Diagnosis Date  . Allergy   . Anxiety   . Asthma   . Atrophic vaginitis 2009  . BV (bacterial vaginosis) 2005  . Candida vaginitis 2006  . Candida vaginitis 2009  . Depression   . Dysuria 2007  . Female pelvic pain 2006  . Fibroid 2005  . GERD (gastroesophageal reflux disease)   . H/O constipation 2009  . H/O dyspareunia 2008  . H/O urinary frequency 2011  . H/O vaginitis 2005  . Heart murmur   . History of bacterial infection   . History of hemorrhoids 2009  . Hx: UTI (urinary tract infection) 2007  . Hyperlipidemia    controlled   . Hypertension    controlled   . Irregular periods/menstrual cycles 2007  . Libido, decreased 2006  . Menopausal symptoms 2007  . Nocturia 2009  . Proteinuria 2009  . Sickle cell trait ([place])    pt denies Sickle cell trait or disease 05-26-2020  . Tenderness of female pelvic organs 2005  . Ulcer   . Vaginal irritation 2011     Objective: Physical Exam General: The patient is alert and oriented x3 in no acute distress.  Dermatology: Skin is warm, dry and supple bilateral lower extremities. Negative for open lesions or macerations bilateral.   Vascular: Dorsalis Pedis and Posterior Tibial pulses palpable bilateral.  Capillary fill time is immediate to all digits.  Neurological: Epicritic and protective threshold intact bilateral.   Musculoskeletal: Tenderness to palpation to the plantar aspect of the right heel along the plantar fascia. All other joints range of motion within normal limits bilateral. Strength 5/5 in all groups bilateral.    Radiographic exam: Normal osseous mineralization. Joint spaces preserved. No fracture/dislocation/boney destruction. No other soft tissue abnormalities or radiopaque foreign bodies.   Assessment: 1. Plantar fasciitis right 2. H/o open plantar fasciotomy right.  DOS: 09/12/2019  Plan of Care:  1. Patient evaluated. Xrays reviewed.   2.  Patient declined injection today 3.  Patient also declined meloxicam 15 mg daily.  She does not like the way it makes her feel 4.  Prescription for tramadol 50 mg #30 5.  Continue sketchers arch fit shoes 6.  Return to clinic 4 weeks   Edrick Kins, DPM [facility]  Dr. Edrick Kins, DPM    [address], [place] [address]                Office [phone]  Fax [phone]

## 2020-09-10 ENCOUNTER — Telehealth: Payer: Self-pay | Admitting: Family Medicine

## 2020-09-10 DIAGNOSIS — Z131 Encounter for screening for diabetes mellitus: Secondary | ICD-10-CM

## 2020-09-10 NOTE — Telephone Encounter (Signed)
Patient is wanting lab work to see if she is a diabetic.

## 2020-09-11 NOTE — Telephone Encounter (Signed)
I ordered an A1c. She can come get it in the lab, just needs to schedule an appointment in the lab. Does not need to be fasting.  Thanks Leeanne Rio, MD

## 2020-09-15 NOTE — Telephone Encounter (Signed)
Spoke to patient and informed her that she can make a lab appointment to have her A1C checked.  PCP has ordered test as future lab.  Patient verbalized understanding.  Regina Sawyer, [place]

## 2020-09-20 NOTE — Progress Notes (Signed)
Patient canceled appointment last minute.  A1C obtained and Follow up scheduled with PCP per pt request. Opened in error.

## 2020-09-23 ENCOUNTER — Other Ambulatory Visit: Payer: 59

## 2020-09-23 ENCOUNTER — Other Ambulatory Visit: Payer: Self-pay

## 2020-09-23 ENCOUNTER — Ambulatory Visit (INDEPENDENT_AMBULATORY_CARE_PROVIDER_SITE_OTHER): Payer: 59 | Admitting: Family Medicine

## 2020-09-23 DIAGNOSIS — Z131 Encounter for screening for diabetes mellitus: Secondary | ICD-10-CM | POA: Diagnosis not present

## 2020-09-23 LAB — POCT GLYCOSYLATED HEMOGLOBIN (HGB A1C): HbA1c, POC (controlled diabetic range): 5.8 % (ref 0.0–7.0)

## 2020-10-02 ENCOUNTER — Ambulatory Visit: Payer: 59 | Admitting: Physician Assistant

## 2020-10-07 ENCOUNTER — Other Ambulatory Visit: Payer: Self-pay

## 2020-10-07 ENCOUNTER — Ambulatory Visit (INDEPENDENT_AMBULATORY_CARE_PROVIDER_SITE_OTHER): Payer: 59 | Admitting: Podiatry

## 2020-10-07 DIAGNOSIS — M79671 Pain in right foot: Secondary | ICD-10-CM

## 2020-10-07 DIAGNOSIS — M722 Plantar fascial fibromatosis: Secondary | ICD-10-CM | POA: Diagnosis not present

## 2020-10-07 MED ORDER — TRAMADOL HCL 50 MG PO TABS: 50.0000 mg | ORAL_TABLET | Freq: Three times a day (TID) | ORAL | 0 refills | Status: DC | PRN

## 2020-10-07 NOTE — Progress Notes (Signed)
    Subjective: 65 y.o. female presenting today for increased heel pain to the right foot.  Patient does have history of open plantar fasciotomy right foot.  DOS: 09/12/2019.  Patient states that she is feeling much better.  She states that she is able to walk without significant pain.  No new complaints at this time  Past Medical History:  Diagnosis Date   Allergy    Anxiety    Asthma    Atrophic vaginitis 2009   BV (bacterial vaginosis) 2005   Candida vaginitis 2006   Candida vaginitis 2009   Depression    Dysuria 2007   Female pelvic pain 2006   Fibroid 2005   GERD (gastroesophageal reflux disease)    H/O constipation 2009   H/O dyspareunia 2008   H/O urinary frequency 2011   H/O vaginitis 2005   Heart murmur    History of bacterial infection    History of hemorrhoids 2009   Hx: UTI (urinary tract infection) 2007   Hyperlipidemia    controlled    Hypertension    controlled    Irregular periods/menstrual cycles 2007   Libido, decreased 2006   Menopausal symptoms 2007   Nocturia 2009   Proteinuria 2009   Sickle cell trait ([place])    pt denies Sickle cell trait or disease 05-26-2020   Tenderness of female pelvic organs 2005   Ulcer    Vaginal irritation 2011     Objective: Physical Exam General: The patient is alert and oriented x3 in no acute distress.  Dermatology: Skin is warm, dry and supple bilateral lower extremities. Negative for open lesions or macerations bilateral.   Vascular: Dorsalis Pedis and Posterior Tibial pulses palpable bilateral.  Capillary fill time is immediate to all digits.  Neurological: Epicritic and protective threshold intact bilateral.   Musculoskeletal: There continues to be some mild tenderness to palpation to the plantar aspect of the right heel along the plantar fascia. All other joints range of motion within normal limits bilateral. Strength 5/5 in all groups bilateral.     Assessment: 1. Plantar fasciitis right 2. H/o open  plantar fasciotomy right.  DOS: 09/12/2019  Plan of Care:  1. Patient evaluated.  2.  Patient states that she is feeling significantly better today.  She only has minimal tenderness with walking. 3.  Continue sketchers go walk shoes 4.  Refill prescription for tramadol 50 mg #60 every 8 hours as needed pain 5.  Return to clinic as needed   Edrick Kins, DPM [facility]  Dr. Edrick Kins, DPM    [address], [place] [address]                Office [phone]  Fax [phone]

## 2020-10-15 ENCOUNTER — Other Ambulatory Visit: Payer: Self-pay

## 2020-10-15 ENCOUNTER — Ambulatory Visit (INDEPENDENT_AMBULATORY_CARE_PROVIDER_SITE_OTHER): Payer: 59

## 2020-10-15 DIAGNOSIS — Z23 Encounter for immunization: Secondary | ICD-10-CM

## 2020-10-19 ENCOUNTER — Other Ambulatory Visit: Payer: Self-pay | Admitting: Family Medicine

## 2020-11-20 ENCOUNTER — Other Ambulatory Visit: Payer: Self-pay | Admitting: Podiatry

## 2020-11-26 ENCOUNTER — Other Ambulatory Visit: Payer: Self-pay | Admitting: Family Medicine

## 2020-12-03 ENCOUNTER — Other Ambulatory Visit: Payer: Self-pay | Admitting: Family Medicine

## 2020-12-04 ENCOUNTER — Ambulatory Visit: Payer: 59 | Admitting: Family Medicine

## 2020-12-15 ENCOUNTER — Ambulatory Visit: Payer: 59 | Admitting: Family Medicine

## 2020-12-17 ENCOUNTER — Encounter: Payer: Self-pay | Admitting: Family Medicine

## 2020-12-29 ENCOUNTER — Ambulatory Visit: Payer: 59

## 2021-01-18 ENCOUNTER — Other Ambulatory Visit: Payer: Self-pay | Admitting: Podiatry

## 2021-02-03 ENCOUNTER — Other Ambulatory Visit: Payer: Self-pay | Admitting: Family Medicine

## 2021-02-15 ENCOUNTER — Other Ambulatory Visit: Payer: Self-pay | Admitting: Family Medicine

## 2021-03-10 ENCOUNTER — Other Ambulatory Visit: Payer: Self-pay | Admitting: Podiatry

## 2021-03-10 ENCOUNTER — Other Ambulatory Visit: Payer: Self-pay | Admitting: Family Medicine

## 2021-03-27 ENCOUNTER — Other Ambulatory Visit: Payer: Self-pay | Admitting: Family Medicine

## 2021-03-30 ENCOUNTER — Other Ambulatory Visit: Payer: Self-pay

## 2021-03-30 ENCOUNTER — Ambulatory Visit (INDEPENDENT_AMBULATORY_CARE_PROVIDER_SITE_OTHER): Payer: 59

## 2021-03-30 DIAGNOSIS — Z23 Encounter for immunization: Secondary | ICD-10-CM | POA: Diagnosis not present

## 2021-03-30 NOTE — Progress Notes (Signed)
Patient presents to nurse clinic for flu vaccination. Administered in LD, site unremarkable, tolerated injection well.   Jamiel Goncalves C Calypso Hagarty, RN   

## 2021-04-14 ENCOUNTER — Other Ambulatory Visit: Payer: Self-pay | Admitting: Family Medicine

## 2021-05-09 ENCOUNTER — Other Ambulatory Visit: Payer: Self-pay | Admitting: Podiatry

## 2021-05-09 ENCOUNTER — Other Ambulatory Visit: Payer: Self-pay | Admitting: Family Medicine

## 2021-06-03 ENCOUNTER — Other Ambulatory Visit: Payer: Self-pay | Admitting: Family Medicine

## 2021-06-07 ENCOUNTER — Ambulatory Visit: Payer: 59 | Admitting: Podiatry

## 2021-06-14 ENCOUNTER — Ambulatory Visit: Payer: 59 | Admitting: Podiatry

## 2021-07-05 ENCOUNTER — Ambulatory Visit (INDEPENDENT_AMBULATORY_CARE_PROVIDER_SITE_OTHER): Payer: 59

## 2021-07-05 ENCOUNTER — Other Ambulatory Visit: Payer: Self-pay

## 2021-07-05 ENCOUNTER — Ambulatory Visit (INDEPENDENT_AMBULATORY_CARE_PROVIDER_SITE_OTHER): Payer: 59 | Admitting: Podiatry

## 2021-07-05 DIAGNOSIS — M722 Plantar fascial fibromatosis: Secondary | ICD-10-CM | POA: Diagnosis not present

## 2021-07-05 NOTE — Progress Notes (Signed)
? ?HPI: 66 y.o. female presenting today for evaluation of chronic recurrent right heel pain.  Patient does have history of open plantar fasciotomy right foot.  09/12/2019.  She was last seen here in the office on 10/07/2020.  Patient states that recently she has had an increase of pain.  She does admit to walking barefoot.  She presents for further treatment and evaluation ? ?Past Medical History:  ?Diagnosis Date  ? Allergy   ? Anxiety   ? Asthma   ? Atrophic vaginitis 2009  ? BV (bacterial vaginosis) 2005  ? Candida vaginitis 2006  ? Candida vaginitis 2009  ? Depression   ? Dysuria 2007  ? Female pelvic pain 2006  ? Fibroid 2005  ? GERD (gastroesophageal reflux disease)   ? H/O constipation 2009  ? H/O dyspareunia 2008  ? H/O urinary frequency 2011  ? H/O vaginitis 2005  ? Heart murmur   ? History of bacterial infection   ? History of hemorrhoids 2009  ? Hx: UTI (urinary tract infection) 2007  ? Hyperlipidemia   ? controlled   ? Hypertension   ? controlled   ? Irregular periods/menstrual cycles 2007  ? Libido, decreased 2006  ? Menopausal symptoms 2007  ? Nocturia 2009  ? Proteinuria 2009  ? Sickle cell trait ([place])   ? pt denies Sickle cell trait or disease 05-26-2020  ? Tenderness of female pelvic organs 2005  ? Ulcer   ? Vaginal irritation 2011  ? ? ?Past Surgical History:  ?Procedure Laterality Date  ? CARPAL TUNNEL RELEASE    ? both wrists  ? COLONOSCOPY N/A 10/28/2014  ? Procedure: COLONOSCOPY;  Surgeon: Inda Castle, MD;  Location: [facility];  Service: Endoscopy;  Laterality: N/A;  ? COLONOSCOPY    ? ESOPHAGOGASTRODUODENOSCOPY N/A 08/06/2014  ? Procedure: ESOPHAGOGASTRODUODENOSCOPY (EGD);  Surgeon: Inda Castle, MD;  Location: [facility];  Service: Endoscopy;  Laterality: N/A;  ? HEEL SPUR SURGERY Right   ? POLYPECTOMY    ? TUBAL LIGATION    ? UPPER GASTROINTESTINAL ENDOSCOPY    ? ? ?Allergies  ?Allergen Reactions  ? Lipitor [Atorvastatin] Other (See Comments)  ?  Abdominal pain, severe nausea, muscle  / body aches  ? Lactose   ? Latex Hives, Itching and Rash  ? ?  ?Physical Exam: ?General: The patient is alert and oriented x3 in no acute distress. ? ?Dermatology: Skin is warm, dry and supple bilateral lower extremities. Negative for open lesions or macerations. ? ?Vascular: Palpable pedal pulses bilaterally. Capillary refill within normal limits.  Negative for any significant edema or erythema ? ?Neurological: Light touch and protective threshold grossly intact ? ?Musculoskeletal Exam: No pedal deformities noted.  There is some tenderness to palpation along the right plantar heel ? ?Radiographic exam: Plantar calcaneal spurs noted on lateral view.  There does appear to be some radiopacity along the plantar surface at the insertion of these spurs. ? ?Assessment: ?1.  Chronic recurrent plantar fasciitis right ? ?Plan of Care:  ?1. Patient evaluated. X-Rays reviewed.  ?2.  Patient declined injection ?3.  Patient declined any oral anti-inflammatory medication ?4.  Patient takes tramadol on occasion.  Continue as needed ?5.  Advised against going barefoot.  Recommend good supportive shoes and sneakers ?6.  Return to clinic as needed ? ?  ?  ?Edrick Kins, DPM ?[facility] ? ?Dr. Edrick Kins, DPM  ?  ?[address]                                        ?  [place], [place] [address]                ?Office [phone]  ?Fax [phone] ? ? ? ? ?

## 2021-07-06 ENCOUNTER — Other Ambulatory Visit: Payer: Self-pay

## 2021-07-06 MED ORDER — MIRTAZAPINE 15 MG PO TABS: 15.0000 mg | ORAL_TABLET | Freq: Every day | ORAL | 0 refills | Status: DC

## 2021-07-06 MED ORDER — ESCITALOPRAM OXALATE 20 MG PO TABS: 20.0000 mg | ORAL_TABLET | Freq: Every day | ORAL | 0 refills | Status: DC

## 2021-07-06 NOTE — Telephone Encounter (Signed)
Please let patient know I am refilling this medication, but she needs to schedule an appointment with me.   Thanks, Jatniel Verastegui J Jossie Smoot, MD  

## 2021-07-08 NOTE — Telephone Encounter (Signed)
Pt informed and scheduled. Regina Sawyer, CMA  

## 2021-07-09 ENCOUNTER — Other Ambulatory Visit: Payer: Self-pay | Admitting: Podiatry

## 2021-07-27 ENCOUNTER — Ambulatory Visit (INDEPENDENT_AMBULATORY_CARE_PROVIDER_SITE_OTHER): Payer: 59

## 2021-07-27 ENCOUNTER — Other Ambulatory Visit: Payer: Self-pay

## 2021-07-27 ENCOUNTER — Ambulatory Visit (INDEPENDENT_AMBULATORY_CARE_PROVIDER_SITE_OTHER): Payer: 59 | Admitting: Family Medicine

## 2021-07-27 ENCOUNTER — Encounter: Payer: Self-pay | Admitting: Family Medicine

## 2021-07-27 VITALS — BP 130/70 | HR 79 | Wt 175.6 lb

## 2021-07-27 DIAGNOSIS — I10 Essential (primary) hypertension: Secondary | ICD-10-CM | POA: Diagnosis not present

## 2021-07-27 DIAGNOSIS — Z23 Encounter for immunization: Secondary | ICD-10-CM

## 2021-07-27 DIAGNOSIS — F418 Other specified anxiety disorders: Secondary | ICD-10-CM | POA: Diagnosis not present

## 2021-07-27 DIAGNOSIS — R7303 Prediabetes: Secondary | ICD-10-CM

## 2021-07-27 DIAGNOSIS — Z862 Personal history of diseases of the blood and blood-forming organs and certain disorders involving the immune mechanism: Secondary | ICD-10-CM

## 2021-07-27 DIAGNOSIS — E785 Hyperlipidemia, unspecified: Secondary | ICD-10-CM

## 2021-07-27 MED ORDER — ESCITALOPRAM OXALATE 5 MG PO TABS: ORAL_TABLET | ORAL | 0 refills | Status: DC

## 2021-07-27 MED ORDER — SHINGRIX 50 MCG/0.5ML IM SUSR: 0.5000 mL | Freq: Once | INTRAMUSCULAR | 1 refills | Status: AC

## 2021-07-27 MED ORDER — ESCITALOPRAM OXALATE 20 MG PO TABS: 10.0000 mg | ORAL_TABLET | Freq: Every day | ORAL | 0 refills | Status: DC

## 2021-07-27 NOTE — Patient Instructions (Signed)
To taper off escitalopram: ?Starting tomorrow 4/12, take HALF of a '20mg'$  pill ('10mg'$  total) daily for 7 days. ?Then on 4/19 take ONE '5mg'$  pill daily for 7 days ?Then on 4/26 take HALF of a '5mg'$  pill (2.'5mg'$  total) daily for 7 days. ? ?Follow up with me in about 6 weeks to see how you are doing after coming off this medication. ? ?Take shingles vaccine to your pharmacy ? ?COVID booster today ? ?Be well, ?Dr. Ardelia Mems  ?

## 2021-07-27 NOTE — Progress Notes (Signed)
?  Date of Visit: 07/27/2021  ? ?SUBJECTIVE:  ? ?HPI: ? ?Regina Sawyer presents today for routine follow up. ? ?Mood/anxiety - taking lexapro '20mg'$  daily as well as mirtazapine '15mg'$  at night. Doing well overall. Denies SI/HI. Desires to try to come off lexapro. ? ?Hypertension - taking lisinopril '20mg'$  daily and coreg 6.'25mg'$  twice daily , tolerating well. ? ?Hyperlipidemia - taking crestor '20mg'$  daily. Not fasting today ? ? ?OBJECTIVE:  ? ?BP 130/70   Pulse 79   Wt 175 lb 9.6 oz (79.7 kg)   SpO2 100%   BMI 32.12 kg/m?  ?Gen: no acute distress, pleasant, cooperative ?HEENT: normocephalic, atraumatic  ?Heart: regular rate and rhythm ?Lungs: clear to auscultation bilaterally, normal work of breathing  ?Neuro: alert, speech normal, grossly nonfocal ?Ext: No appreciable lower extremity edema bilaterally  ? ?ASSESSMENT/PLAN:  ? ?Health maintenance:  ?-shingrix rx provided to get at her pharmacy ?-bivalent COVID booster given today ? ?Anxiety disorder, unspecified ?Desires to come off lexapro, reasonable given stability ?Provided instructions for tapering - see after visit summary for details ?Follow up with me in 6 weeks to see how she's doing ?Discussed discontinuation symptoms vs recurrence of mood/anxiety, she will let me know if she experiences either ? ?HYPERTENSION, BENIGN ESSENTIAL ?Well controlled. Continue current medication regimen.  ? ?Hyperlipidemia ?Update lipids at fasting lab visit along with CMET and A1c ? ?FOLLOW UP: ?Follow up in 6 wks for mood ? ?[place]. Ardelia Mems, MD ?[facility] ?

## 2021-07-30 ENCOUNTER — Other Ambulatory Visit (INDEPENDENT_AMBULATORY_CARE_PROVIDER_SITE_OTHER): Payer: 59

## 2021-07-30 DIAGNOSIS — Z862 Personal history of diseases of the blood and blood-forming organs and certain disorders involving the immune mechanism: Secondary | ICD-10-CM

## 2021-07-30 DIAGNOSIS — R7303 Prediabetes: Secondary | ICD-10-CM | POA: Diagnosis not present

## 2021-07-30 DIAGNOSIS — I10 Essential (primary) hypertension: Secondary | ICD-10-CM

## 2021-07-30 LAB — POCT GLYCOSYLATED HEMOGLOBIN (HGB A1C): HbA1c, POC (controlled diabetic range): 6.1 % (ref 0.0–7.0)

## 2021-07-31 LAB — CBC
Hematocrit: 35.6 % (ref 34.0–46.6)
Hemoglobin: 11.4 g/dL (ref 11.1–15.9)
MCH: 27 pg (ref 26.6–33.0)
MCHC: 32 g/dL (ref 31.5–35.7)
MCV: 84 fL (ref 79–97)
Platelets: 285 10*3/uL (ref 150–450)
RBC: 4.22 x10E6/uL (ref 3.77–5.28)
RDW: 14.1 % (ref 11.7–15.4)
WBC: 8.5 10*3/uL (ref 3.4–10.8)

## 2021-07-31 LAB — CMP14+EGFR
ALT: 19 IU/L (ref 0–32)
AST: 24 IU/L (ref 0–40)
Albumin/Globulin Ratio: 1.2 (ref 1.2–2.2)
Albumin: 3.8 g/dL (ref 3.8–4.8)
Alkaline Phosphatase: 88 IU/L (ref 44–121)
BUN/Creatinine Ratio: 8 — ABNORMAL LOW (ref 12–28)
BUN: 11 mg/dL (ref 8–27)
Bilirubin Total: 0.3 mg/dL (ref 0.0–1.2)
CO2: 23 mmol/L (ref 20–29)
Calcium: 8.9 mg/dL (ref 8.7–10.3)
Chloride: 105 mmol/L (ref 96–106)
Creatinine, Ser: 1.31 mg/dL — ABNORMAL HIGH (ref 0.57–1.00)
Globulin, Total: 3.1 g/dL (ref 1.5–4.5)
Glucose: 104 mg/dL — ABNORMAL HIGH (ref 70–99)
Potassium: 4.9 mmol/L (ref 3.5–5.2)
Sodium: 143 mmol/L (ref 134–144)
Total Protein: 6.9 g/dL (ref 6.0–8.5)
eGFR: 46 mL/min/{1.73_m2} — ABNORMAL LOW (ref >59)

## 2021-07-31 LAB — LIPID PANEL
Chol/HDL Ratio: 2.8 ratio (ref 0.0–4.4)
Cholesterol, Total: 129 mg/dL (ref 100–199)
HDL: 46 mg/dL (ref >39)
LDL Chol Calc (NIH): 64 mg/dL (ref 0–99)
Triglycerides: 105 mg/dL (ref 0–149)
VLDL Cholesterol Cal: 19 mg/dL (ref 5–40)

## 2021-08-02 NOTE — Assessment & Plan Note (Signed)
Desires to come off lexapro, reasonable given stability ?Provided instructions for tapering - see after visit summary for details ?Follow up with me in 6 weeks to see how she's doing ?Discussed discontinuation symptoms vs recurrence of mood/anxiety, she will let me know if she experiences either ?

## 2021-08-02 NOTE — Assessment & Plan Note (Signed)
Well controlled. Continue current medication regimen.  

## 2021-08-02 NOTE — Assessment & Plan Note (Signed)
Update lipids at fasting lab visit along with CMET and A1c ?

## 2021-08-03 ENCOUNTER — Telehealth: Payer: Self-pay

## 2021-08-03 NOTE — Telephone Encounter (Signed)
Patient calls nurse line requesting lab result from 4/14.  ? ?Will forward to PCP.  ?

## 2021-08-08 ENCOUNTER — Other Ambulatory Visit: Payer: Self-pay | Admitting: Family Medicine

## 2021-08-09 NOTE — Telephone Encounter (Signed)
Spoke with patient and reviewed results ?Plan to recheck renal function in about 3 months ?A1c up slightly but remains prediabetic ?Rest of labs look fine. Patient appreciative. ? ?Leeanne Rio, MD  ?

## 2021-08-12 ENCOUNTER — Other Ambulatory Visit: Payer: Self-pay | Admitting: Podiatry

## 2021-08-12 ENCOUNTER — Other Ambulatory Visit: Payer: Self-pay | Admitting: Family Medicine

## 2021-08-25 ENCOUNTER — Other Ambulatory Visit: Payer: Self-pay | Admitting: Family Medicine

## 2021-08-30 ENCOUNTER — Ambulatory Visit (INDEPENDENT_AMBULATORY_CARE_PROVIDER_SITE_OTHER): Payer: 59

## 2021-08-30 ENCOUNTER — Ambulatory Visit (INDEPENDENT_AMBULATORY_CARE_PROVIDER_SITE_OTHER): Payer: 59 | Admitting: Podiatry

## 2021-08-30 DIAGNOSIS — M722 Plantar fascial fibromatosis: Secondary | ICD-10-CM

## 2021-08-30 NOTE — Progress Notes (Signed)
? ?HPI: 66 y.o. female presenting today for evaluation of chronic recurrent right heel pain.  Patient does have history of open plantar fasciotomy right foot.  09/12/2019.  Patient continues to have pain and tenderness to the right plantar heel.  She is very frustrated and is painful on a daily basis despite different shoes.  She presents for further treatment and evaluation ? ?Past Medical History:  ?Diagnosis Date  ? Allergy   ? Anxiety   ? Asthma   ? Atrophic vaginitis 2009  ? BV (bacterial vaginosis) 2005  ? Candida vaginitis 2006  ? Candida vaginitis 2009  ? Depression   ? Dysuria 2007  ? Female pelvic pain 2006  ? Fibroid 2005  ? GERD (gastroesophageal reflux disease)   ? H/O constipation 2009  ? H/O dyspareunia 2008  ? H/O urinary frequency 2011  ? H/O vaginitis 2005  ? Heart murmur   ? History of bacterial infection   ? History of hemorrhoids 2009  ? Hx: UTI (urinary tract infection) 2007  ? Hyperlipidemia   ? controlled   ? Hypertension   ? controlled   ? Irregular periods/menstrual cycles 2007  ? Libido, decreased 2006  ? Menopausal symptoms 2007  ? Nocturia 2009  ? Proteinuria 2009  ? Sickle cell trait ([place])   ? pt denies Sickle cell trait or disease 05-26-2020  ? Tenderness of female pelvic organs 2005  ? Ulcer   ? Vaginal irritation 2011  ? ? ?Past Surgical History:  ?Procedure Laterality Date  ? CARPAL TUNNEL RELEASE    ? both wrists  ? COLONOSCOPY N/A 10/28/2014  ? Procedure: COLONOSCOPY;  Surgeon: Inda Castle, MD;  Location: [facility];  Service: Endoscopy;  Laterality: N/A;  ? COLONOSCOPY    ? ESOPHAGOGASTRODUODENOSCOPY N/A 08/06/2014  ? Procedure: ESOPHAGOGASTRODUODENOSCOPY (EGD);  Surgeon: Inda Castle, MD;  Location: [facility];  Service: Endoscopy;  Laterality: N/A;  ? HEEL SPUR SURGERY Right   ? POLYPECTOMY    ? TUBAL LIGATION    ? UPPER GASTROINTESTINAL ENDOSCOPY    ? ? ?Allergies  ?Allergen Reactions  ? Lipitor [Atorvastatin] Other (See Comments)  ?  Abdominal pain, severe nausea,  muscle / body aches  ? Lactose   ? Latex Hives, Itching and Rash  ? ?  ?Physical Exam: ?General: The patient is alert and oriented x3 in no acute distress. ? ?Dermatology: Skin is warm, dry and supple bilateral lower extremities. Negative for open lesions or macerations. ? ?Vascular: Palpable pedal pulses bilaterally. Capillary refill within normal limits.  Negative for any significant edema or erythema ? ?Neurological: Light touch and protective threshold grossly intact ? ?Musculoskeletal Exam: There continues to be pain and tenderness associated to the plantar aspect of the right heel medial aspect  ? ?Radiographic exam 07/05/2021 RT foot: Plantar calcaneal spurs noted on lateral view.  There does appear to be some radiopacity along the plantar surface at the insertion of these spurs. ? ?Assessment: ?1.  Chronic recurrent plantar fasciitis right ? ?Plan of Care:  ?1. Patient evaluated.  Patient continues to have chronic pain and tenderness.  Today we are going to order an MRI RT heel ?2.  Patient declined injection ?3.  Patient declined any oral anti-inflammatory medication ?4.  Patient takes tramadol on occasion.  Continue as needed ?5.  Advised against going barefoot.  Recommend good supportive shoes and sneakers ?6.  Return to clinic after MRI to review results ? ?  ?  ?Edrick Kins, DPM ?[facility] ? ?Dr. Edrick Kins, DPM  ?  ?[address]                                        ?[place], [place] [address]                ?Office [phone]  ?Fax [phone] ? ? ? ? ?

## 2021-09-07 ENCOUNTER — Ambulatory Visit: Payer: 59 | Admitting: Family Medicine

## 2021-09-09 ENCOUNTER — Other Ambulatory Visit: Payer: Self-pay | Admitting: Family Medicine

## 2021-09-11 ENCOUNTER — Ambulatory Visit
Admission: RE | Admit: 2021-09-11 | Discharge: 2021-09-11 | Disposition: A | Discharge status: Home / Self Care | Payer: 59 | Source: Ambulatory Visit | Attending: Podiatry | Admitting: Podiatry

## 2021-09-11 DIAGNOSIS — M722 Plantar fascial fibromatosis: Secondary | ICD-10-CM

## 2021-09-20 ENCOUNTER — Ambulatory Visit: Payer: 59 | Admitting: Podiatry

## 2021-09-21 ENCOUNTER — Encounter: Payer: Self-pay | Admitting: *Deleted

## 2021-09-22 ENCOUNTER — Ambulatory Visit: Payer: 59 | Admitting: Podiatry

## 2021-09-27 ENCOUNTER — Ambulatory Visit (INDEPENDENT_AMBULATORY_CARE_PROVIDER_SITE_OTHER): Payer: 59 | Admitting: Family Medicine

## 2021-09-27 ENCOUNTER — Encounter: Payer: Self-pay | Admitting: Family Medicine

## 2021-09-27 VITALS — BP 120/70 | HR 74 | Temp 98.8°F | Ht 62.0 in | Wt 174.4 lb

## 2021-09-27 DIAGNOSIS — F418 Other specified anxiety disorders: Secondary | ICD-10-CM

## 2021-09-27 DIAGNOSIS — Z01818 Encounter for other preprocedural examination: Secondary | ICD-10-CM

## 2021-09-27 NOTE — Progress Notes (Signed)
  Date of Visit: 09/27/2021   SUBJECTIVE:   HPI:  Regina Sawyer presents today for follow up of depression, also for preoperative evaluation prior to shoulder surgery.  Depression - taking remeron '15mg'$  daily. At last visit we elected to taper off her lexapro as she had been very stable on it.  She successfully came off of the Lexapro.  Denies thoughts of hurting herself or others.  Preoperative evaluation: Is planning to have arthroscopic shoulder surgery to repair her rotator cuff.  This surgery has not yet been scheduled.  I was sent paperwork to fill out prior to the surgery.  She reports she can walk around and go up stairs without chest pain or shortness of breath.  Denies any prior issues with surgeries or bleeding issues.   OBJECTIVE:   BP 120/70   Pulse 74   Temp 98.8 F (37.1 C)   Ht '5\' 2"'$  (1.575 m)   Wt 174 lb 6.4 oz (79.1 kg)   SpO2 98%   BMI 31.90 kg/m  Gen: No acute distress, pleasant, cooperative HEENT: Normocephalic, atraumatic Heart: Regular rate and rhythm, no murmurs Lungs: Clear to auscultation bilaterally, normal effort Neuro: Alert, grossly nonfocal, speech normal Ext: 2+ radial pulses bilaterally Psych: Speech normal in rate and volume, normal eye contact, well-groomed  ASSESSMENT/PLAN:   Health maintenance:  -Current on health maintenance items  Preoperative evaluation Plugged patient into the NSQIP surgical risk calculator, and she is below average risk on everything except for surgical site infection, for which she is at average risk.  This is acceptable for proceeding with surgery.  EKG done today and shows normal sinus rhythm without any signs of ischemia.  I will complete preoperative evaluation paperwork and send it back to the orthopedic office.    Anxiety disorder, unspecified Did well coming off the Lexapro.  Continue Remeron as presently prescribed. Husband present during today's visit.  Patient and husband they argued repeatedly during the visit  about whether or not he has been unfaithful.  Encouraged them to see a counselor to discuss these issues.  Husband reports that patient has said she was going to hurt him if he cheated on her.  Patient denies any thoughts or plans of hurting herself or anyone else.  FOLLOW UP: Follow up in 3 months for hypertension   [place] J. Ardelia Mems, [place] than 30 minutes were spent on this encounter on the day of service, including pre-visit planning, actual face to face time, coordination of care, and documentation of visit.

## 2021-09-27 NOTE — Assessment & Plan Note (Signed)
Did well coming off the Lexapro.  Continue Remeron as presently prescribed. Husband present during today's visit.  Patient and husband they argued repeatedly during the visit about whether or not he has been unfaithful.  Encouraged them to see a counselor to discuss these issues.  Husband reports that patient has said she was going to hurt him if he cheated on her.  Patient denies any thoughts or plans of hurting herself or anyone else.

## 2021-09-27 NOTE — Patient Instructions (Addendum)
It was great to see you again today!  I will send in paperwork so you can have your shoulder surgery.  Your EKG was normal today.  Let me know if you have any issues being off of the Lexapro. I encourage you and Dominica Severin to see a counselor to talk about the issues brought up today.  Be well, Dr. Ardelia Mems

## 2021-09-28 ENCOUNTER — Telehealth: Payer: Self-pay | Admitting: Family Medicine

## 2021-09-28 ENCOUNTER — Other Ambulatory Visit: Payer: Self-pay | Admitting: Family Medicine

## 2021-09-28 NOTE — Telephone Encounter (Signed)
Patient calls regarding a paper Dr. Ardelia Mems said she would complete and fax back to her surgeon for her arm/shoulder surgery. Please update patient with the status of this form.

## 2021-09-29 NOTE — Telephone Encounter (Signed)
Form filled out, Regina Sawyer will fax and inform patient Regina Rio, MD

## 2021-09-29 NOTE — Telephone Encounter (Signed)
Faxed to Dr. Tonita Cong.  Pt informed. Christen Bame, CMA

## 2021-09-30 ENCOUNTER — Ambulatory Visit: Payer: Self-pay | Admitting: Orthopedic Surgery

## 2021-10-04 ENCOUNTER — Ambulatory Visit (INDEPENDENT_AMBULATORY_CARE_PROVIDER_SITE_OTHER): Payer: 59 | Admitting: Podiatry

## 2021-10-04 ENCOUNTER — Ambulatory Visit: Payer: Self-pay | Admitting: Orthopedic Surgery

## 2021-10-04 DIAGNOSIS — G8929 Other chronic pain: Secondary | ICD-10-CM | POA: Diagnosis not present

## 2021-10-04 DIAGNOSIS — M79671 Pain in right foot: Secondary | ICD-10-CM

## 2021-10-04 NOTE — H&P (Signed)
Regina Sawyer is an 66 y.o. female.   Chief Complaint: R shoulder pain HPI: Visit For: Follow up Location: right; shoulder Duration: The patient is nearly a year out Severity: pain level 8/10 Timing: chronic Aggravating Factors: lifting Prior Imaging: x ray; MRI Previous Injections: right AC joint injection 05/25/21 Work Related: no; 10lb. lifting restriction, no overhead work, no bending, stooping, squatting, and no prolonged sitting or standing.  Past Medical History:  Diagnosis Date   Allergy    Anxiety    Asthma    Atrophic vaginitis 2009   BV (bacterial vaginosis) 2005   Candida vaginitis 2006   Candida vaginitis 2009   Depression    Dysuria 2007   Female pelvic pain 2006   Fibroid 2005   GERD (gastroesophageal reflux disease)    H/O constipation 2009   H/O dyspareunia 2008   H/O urinary frequency 2011   H/O vaginitis 2005   Heart murmur    History of bacterial infection    History of hemorrhoids 2009   Hx: UTI (urinary tract infection) 2007   Hyperlipidemia    controlled    Hypertension    controlled    Irregular periods/menstrual cycles 2007   Libido, decreased 2006   Menopausal symptoms 2007   Nocturia 2009   Proteinuria 2009   Sickle cell trait ([place])    pt denies Sickle cell trait or disease 05-26-2020   Tenderness of female pelvic organs 2005   Ulcer    Vaginal irritation 2011    Past Surgical History:  Procedure Laterality Date   CARPAL TUNNEL RELEASE     both wrists   COLONOSCOPY N/A 10/28/2014   Procedure: COLONOSCOPY;  Surgeon: Inda Castle, MD;  Location: [facility];  Service: Endoscopy;  Laterality: N/A;   COLONOSCOPY     ESOPHAGOGASTRODUODENOSCOPY N/A 08/06/2014   Procedure: ESOPHAGOGASTRODUODENOSCOPY (EGD);  Surgeon: Inda Castle, MD;  Location: [facility];  Service: Endoscopy;  Laterality: N/A;   HEEL SPUR SURGERY Right    POLYPECTOMY     TUBAL LIGATION     UPPER GASTROINTESTINAL ENDOSCOPY      Family History  Problem Relation  Age of Onset   Diabetes Sister    Hypertension Sister    Heart failure Father    Heart failure Brother        x2   Gallstones Mother    Colon polyps Neg Hx    Kidney disease Neg Hx    Esophageal cancer Neg Hx    Gallbladder disease Neg Hx    Rectal cancer Neg Hx    Stomach cancer Neg Hx    Social History:  reports that she has never smoked. She has been exposed to tobacco smoke. She has never used smokeless tobacco. She reports that she does not drink alcohol and does not use drugs.  Allergies:  Allergies  Allergen Reactions   Lipitor [Atorvastatin] Other (See Comments)    Abdominal pain, severe nausea, muscle / body aches   Lactose    Latex Hives, Itching and Rash   Current meds: Anti-FungaL 2 % topical powder carvediloL 6.25 mg tablet clindamycin phosphate 1 % topical solution cyclobenzaprine 5 mg tablet Dexilant 30 mg capsule, delayed release diclofenac 1 % topical gel escitalopram 20 mg tablet fluconazole 150 mg tablet hydrocortisone 2.5 % topical cream hydrocortisone 2.5 % topical ointment ibuprofen 800 mg tablet lisinopriL 20 mg tablet mirtazapine 15 mg tablet neomycin-polymyxin-dexameth 3.5 mg/mL-10,000 unit/mL-0.1% eye drops rosuvastatin 20 mg tablet traMADoL 50 mg tablet triamcinolone acetonide 0.1 %  topical ointment  Review of Systems  Constitutional: Negative.   HENT: Negative.    Eyes: Negative.   Respiratory: Negative.    Cardiovascular: Negative.   Gastrointestinal: Negative.   Endocrine: Negative.   Genitourinary: Negative.   Musculoskeletal:  Positive for arthralgias and myalgias.  Skin: Negative.   Neurological: Negative.   Psychiatric/Behavioral: Negative.      There were no vitals taken for this visit. Physical Exam Constitutional:      Appearance: Normal appearance.  HENT:     Head: Normocephalic and atraumatic.     Right Ear: External ear normal.     Left Ear: External ear normal.     Nose: Nose normal.     Mouth/Throat:      Pharynx: Oropharynx is clear.  Eyes:     Conjunctiva/sclera: Conjunctivae normal.  Cardiovascular:     Rate and Rhythm: Normal rate and regular rhythm.     Pulses: Normal pulses.     Heart sounds: Normal heart sounds.  Pulmonary:     Effort: Pulmonary effort is normal.     Breath sounds: Normal breath sounds.  Abdominal:     General: Bowel sounds are normal.  Musculoskeletal:     Cervical back: Normal range of motion and neck supple.     Comments: Constitutional: General Appearance: healthy-appearing and NAD.  Psychiatric: Mood and Affect: normal mood and affect.  Cardiovascular System: Arterial Pulses Right: radial normal and brachial normal. Varicosities Right: no varicosities.  C-Spine/Neck: Active Range of Motion: flexion normal, extension normal, and no pain elicited on motion.  Shoulders: Inspection Right: no misalignment, atrophy, erythema, swelling, or scapular winging. Bony Palpation Right: no tenderness of the sternoclavicular joint, the coracoid process, the bicipital groove, or the scapula and tenderness of the acromioclavicular joint. Soft Tissue Palpation Right: tenderness of the supraspinatus and the subacromial bursa. Active Range of Motion Right: limited. Special Tests Right: Speed's test negative and Neer's test positive. Stability Right: no laxity, sulcus sign negative, and anterior apprehension test negative. Strength Right: abduction 5/5, adduction 5/5, flexion 5/5, and extension 5/5.  Skin: Right Upper Extremity: normal.  Neurological System: Biceps Reflex Right: normal (2). Brachioradialis Reflex Right: normal (2). Triceps Reflex Right: normal (2). Sensation on the Right: C5 normal, C6 normal, and C7 normal.  Positive crossover maneuver. Pain to range of motion cervical spine  Skin:    General: Skin is warm and dry.  Neurological:     Mental Status: She is alert.    MRI of the right shoulder demonstrates an ill-defined high-grade 6 x 6 mm anterior  supraspinatus tear likely extending to the bursal surface. Severe AC arthrosis. Moderate bursitis. Labral degeneration. Mild biceps tendinosis  Assessment/Plan Impression:  1. Symptomatic small high-grade tear of supraspinatus right shoulder 2. Symptomatic AC arthrosis 3. Degenerative labral tearing  Plan:  We discussed options she does not want any more injections. They gave her temporary relief. She reports the symptoms disabling.  We discussed surgery which would include shoulder arthroscopy subacromial decompression mini open rotator cuff repair and either arthroscopic distal clavicle resection or open distal clavicle resection. Supraspinatus just lateral to the bicipital groove.  An extensive discussion concerning the pathology relevant anatomy and treatment options. After that discussion we mutually agreed to proceed with repair of the rotator cuff utilizing arthroscopic assistance if possible. The risks and benefits of that procedure were discussed including bleeding, infection, suboptimal range of motion, deep venous thrombosis, pulmonary embolism, anesthetic complications etc. in addition we discussed the postoperative course to  include approximately 4 weeks of passive range of motion followed by 4 weeks of active range of motion followed by 4-12 weeks of progressive strengthening exercises. In addition we discussed protective activities to reduce the risk of a reinjury including impingement activities with elbow above the shoulder as well as reaching and repetitive circular motion activities. The hospital stay will either be as a outpatient with a regional block versus overnight depending upon the extent of the procedure and any challenging health issues with a first postoperative visit 2 weeks following the surgery.  Patient reports history of ulcers. Unable to take aspirin. We will use Lovenox postop. Probable overnight. Preoperative clearance.  Plan Right shoulder scope, SAD,  mini-open RCR, DCR  Cecilie Kicks, PA-C for Dr Tonita Cong 10/04/2021, 12:58 PM

## 2021-10-04 NOTE — H&P (View-Only) (Signed)
Regina Sawyer is an 66 y.o. female.   Chief Complaint: R shoulder pain HPI: Visit For: Follow up Location: right; shoulder Duration: The patient is nearly a year out Severity: pain level 8/10 Timing: chronic Aggravating Factors: lifting Prior Imaging: x ray; MRI Previous Injections: right AC joint injection 05/25/21 Work Related: no; 10lb. lifting restriction, no overhead work, no bending, stooping, squatting, and no prolonged sitting or standing.  Past Medical History:  Diagnosis Date   Allergy    Anxiety    Asthma    Atrophic vaginitis 2009   BV (bacterial vaginosis) 2005   Candida vaginitis 2006   Candida vaginitis 2009   Depression    Dysuria 2007   Female pelvic pain 2006   Fibroid 2005   GERD (gastroesophageal reflux disease)    H/O constipation 2009   H/O dyspareunia 2008   H/O urinary frequency 2011   H/O vaginitis 2005   Heart murmur    History of bacterial infection    History of hemorrhoids 2009   Hx: UTI (urinary tract infection) 2007   Hyperlipidemia    controlled    Hypertension    controlled    Irregular periods/menstrual cycles 2007   Libido, decreased 2006   Menopausal symptoms 2007   Nocturia 2009   Proteinuria 2009   Sickle cell trait ([place])    pt denies Sickle cell trait or disease 05-26-2020   Tenderness of female pelvic organs 2005   Ulcer    Vaginal irritation 2011    Past Surgical History:  Procedure Laterality Date   CARPAL TUNNEL RELEASE     both wrists   COLONOSCOPY N/A 10/28/2014   Procedure: COLONOSCOPY;  Surgeon: Inda Castle, MD;  Location: [facility];  Service: Endoscopy;  Laterality: N/A;   COLONOSCOPY     ESOPHAGOGASTRODUODENOSCOPY N/A 08/06/2014   Procedure: ESOPHAGOGASTRODUODENOSCOPY (EGD);  Surgeon: Inda Castle, MD;  Location: [facility];  Service: Endoscopy;  Laterality: N/A;   HEEL SPUR SURGERY Right    POLYPECTOMY     TUBAL LIGATION     UPPER GASTROINTESTINAL ENDOSCOPY      Family History  Problem Relation  Age of Onset   Diabetes Sister    Hypertension Sister    Heart failure Father    Heart failure Brother        x2   Gallstones Mother    Colon polyps Neg Hx    Kidney disease Neg Hx    Esophageal cancer Neg Hx    Gallbladder disease Neg Hx    Rectal cancer Neg Hx    Stomach cancer Neg Hx    Social History:  reports that she has never smoked. She has been exposed to tobacco smoke. She has never used smokeless tobacco. She reports that she does not drink alcohol and does not use drugs.  Allergies:  Allergies  Allergen Reactions   Lipitor [Atorvastatin] Other (See Comments)    Abdominal pain, severe nausea, muscle / body aches   Lactose    Latex Hives, Itching and Rash   Current meds: Anti-FungaL 2 % topical powder carvediloL 6.25 mg tablet clindamycin phosphate 1 % topical solution cyclobenzaprine 5 mg tablet Dexilant 30 mg capsule, delayed release diclofenac 1 % topical gel escitalopram 20 mg tablet fluconazole 150 mg tablet hydrocortisone 2.5 % topical cream hydrocortisone 2.5 % topical ointment ibuprofen 800 mg tablet lisinopriL 20 mg tablet mirtazapine 15 mg tablet neomycin-polymyxin-dexameth 3.5 mg/mL-10,000 unit/mL-0.1% eye drops rosuvastatin 20 mg tablet traMADoL 50 mg tablet triamcinolone acetonide 0.1 %  topical ointment  Review of Systems  Constitutional: Negative.   HENT: Negative.    Eyes: Negative.   Respiratory: Negative.    Cardiovascular: Negative.   Gastrointestinal: Negative.   Endocrine: Negative.   Genitourinary: Negative.   Musculoskeletal:  Positive for arthralgias and myalgias.  Skin: Negative.   Neurological: Negative.   Psychiatric/Behavioral: Negative.      There were no vitals taken for this visit. Physical Exam Constitutional:      Appearance: Normal appearance.  HENT:     Head: Normocephalic and atraumatic.     Right Ear: External ear normal.     Left Ear: External ear normal.     Nose: Nose normal.     Mouth/Throat:      Pharynx: Oropharynx is clear.  Eyes:     Conjunctiva/sclera: Conjunctivae normal.  Cardiovascular:     Rate and Rhythm: Normal rate and regular rhythm.     Pulses: Normal pulses.     Heart sounds: Normal heart sounds.  Pulmonary:     Effort: Pulmonary effort is normal.     Breath sounds: Normal breath sounds.  Abdominal:     General: Bowel sounds are normal.  Musculoskeletal:     Cervical back: Normal range of motion and neck supple.     Comments: Constitutional: General Appearance: healthy-appearing and NAD.  Psychiatric: Mood and Affect: normal mood and affect.  Cardiovascular System: Arterial Pulses Right: radial normal and brachial normal. Varicosities Right: no varicosities.  C-Spine/Neck: Active Range of Motion: flexion normal, extension normal, and no pain elicited on motion.  Shoulders: Inspection Right: no misalignment, atrophy, erythema, swelling, or scapular winging. Bony Palpation Right: no tenderness of the sternoclavicular joint, the coracoid process, the bicipital groove, or the scapula and tenderness of the acromioclavicular joint. Soft Tissue Palpation Right: tenderness of the supraspinatus and the subacromial bursa. Active Range of Motion Right: limited. Special Tests Right: Speed's test negative and Neer's test positive. Stability Right: no laxity, sulcus sign negative, and anterior apprehension test negative. Strength Right: abduction 5/5, adduction 5/5, flexion 5/5, and extension 5/5.  Skin: Right Upper Extremity: normal.  Neurological System: Biceps Reflex Right: normal (2). Brachioradialis Reflex Right: normal (2). Triceps Reflex Right: normal (2). Sensation on the Right: C5 normal, C6 normal, and C7 normal.  Positive crossover maneuver. Pain to range of motion cervical spine  Skin:    General: Skin is warm and dry.  Neurological:     Mental Status: She is alert.    MRI of the right shoulder demonstrates an ill-defined high-grade 6 x 6 mm anterior  supraspinatus tear likely extending to the bursal surface. Severe AC arthrosis. Moderate bursitis. Labral degeneration. Mild biceps tendinosis  Assessment/Plan Impression:  1. Symptomatic small high-grade tear of supraspinatus right shoulder 2. Symptomatic AC arthrosis 3. Degenerative labral tearing  Plan:  We discussed options she does not want any more injections. They gave her temporary relief. She reports the symptoms disabling.  We discussed surgery which would include shoulder arthroscopy subacromial decompression mini open rotator cuff repair and either arthroscopic distal clavicle resection or open distal clavicle resection. Supraspinatus just lateral to the bicipital groove.  An extensive discussion concerning the pathology relevant anatomy and treatment options. After that discussion we mutually agreed to proceed with repair of the rotator cuff utilizing arthroscopic assistance if possible. The risks and benefits of that procedure were discussed including bleeding, infection, suboptimal range of motion, deep venous thrombosis, pulmonary embolism, anesthetic complications etc. in addition we discussed the postoperative course to  include approximately 4 weeks of passive range of motion followed by 4 weeks of active range of motion followed by 4-12 weeks of progressive strengthening exercises. In addition we discussed protective activities to reduce the risk of a reinjury including impingement activities with elbow above the shoulder as well as reaching and repetitive circular motion activities. The hospital stay will either be as a outpatient with a regional block versus overnight depending upon the extent of the procedure and any challenging health issues with a first postoperative visit 2 weeks following the surgery.  Patient reports history of ulcers. Unable to take aspirin. We will use Lovenox postop. Probable overnight. Preoperative clearance.  Plan Right shoulder scope, SAD,  mini-open RCR, DCR  Cecilie Kicks, PA-C for Dr Tonita Cong 10/04/2021, 12:58 PM

## 2021-10-05 NOTE — Patient Instructions (Addendum)
DUE TO COVID-19 ONLY TWO VISITORS  (aged 64 and older)  ARE ALLOWED TO COME WITH YOU AND STAY IN THE WAITING ROOM ONLY DURING PRE OP AND PROCEDURE.   **NO VISITORS ARE ALLOWED IN THE SHORT STAY AREA OR RECOVERY ROOM!!**  IF YOU WILL BE ADMITTED INTO THE HOSPITAL YOU ARE ALLOWED ONLY FOUR SUPPORT PEOPLE DURING VISITATION HOURS ONLY (7 AM -8PM)   The support person(s) must pass our screening, gel in and out, and wear a mask at all times, including in the patient's room. Patients must also wear a mask when staff or their support person are in the room. Visitors GUEST BADGE MUST BE WORN VISIBLY  One adult visitor may remain with you overnight and MUST be in the room by 8 P.M.     Your procedure is scheduled on: 10/20/21   Report to [facility] Main Entrance    Report to admitting at 6:15 AM   Call this number if you have problems the morning of surgery [phone]   Do not eat food :After Midnight.   After Midnight you may have the following liquids until 5:30  AM DAY OF SURGERY  Water Black Coffee (sugar ok, NO MILK/CREAM OR CREAMERS)  Tea (sugar ok, NO MILK/CREAM OR CREAMERS) regular and decaf                             Plain Jell-O (NO RED)                                           Fruit ices (not with fruit pulp, NO RED)                                     Popsicles (NO RED)                                                                  Juice: apple, WHITE grape, WHITE cranberry Sports drinks like Gatorade (NO RED) Clear broth(vegetable,chicken,beef)     The day of surgery:  Drink ONE (1) Pre-Surgery Clear Ensure at 5:30 AM the morning of surgery. Drink in one sitting. Do not sip.  This drink was given to you during your hospital  pre-op appointment visit. Nothing else to drink after completing the  Pre-Surgery Clear Ensure.          If you have questions, please contact your surgeon's office.   FOLLOW BOWEL PREP AND ANY ADDITIONAL PRE OP INSTRUCTIONS YOU  RECEIVED FROM YOUR SURGEON'S OFFICE!!!     Oral Hygiene is also important to reduce your risk of infection.                                    Remember - BRUSH YOUR TEETH THE MORNING OF SURGERY WITH YOUR REGULAR TOOTHPASTE   Take these medicines the morning of surgery with A SIP OF WATER: Carvedilol, Rosuvastatin  You may not have any metal on your body including hair pins, jewelry, and body piercing             Do not wear make-up, lotions, powders, perfumes, or deodorant  Do not wear nail polish including gel and S&S, artificial/acrylic nails, or any other type of covering on natural nails including finger and toenails. If you have artificial nails, gel coating, etc. that needs to be removed by a nail salon please have this removed prior to surgery or surgery may need to be canceled/ delayed if the surgeon/ anesthesia feels like they are unable to be safely monitored.   Do not shave  48 hours prior to surgery.    Do not bring valuables to the hospital. [place].   Bring small overnight bag day of surgery.   DO NOT [place]. PHARMACY WILL DISPENSE MEDICATIONS LISTED ON YOUR MEDICATION LIST TO YOU DURING YOUR ADMISSION [place]!    Special Instructions: Bring a copy of your healthcare power of attorney and living will documents         the day of surgery if you haven't scanned them before.              Please read over the following fact sheets you were given: IF YOU HAVE QUESTIONS ABOUT YOUR PRE-OP INSTRUCTIONS PLEASE CALL [place] - Preparing for Surgery Before surgery, you can play an important role.  Because skin is not sterile, your skin needs to be as free of germs as possible.  You can reduce the number of germs on your skin by washing with CHG (chlorahexidine gluconate) soap before surgery.  CHG is an antiseptic cleaner which kills  germs and bonds with the skin to continue killing germs even after washing. Please DO NOT use if you have an allergy to CHG or antibacterial soaps.  If your skin becomes reddened/irritated stop using the CHG and inform your nurse when you arrive at Short Stay. Do not shave (including legs and underarms) for at least 48 hours prior to the first CHG shower.  You may shave your face/neck.  Please follow these instructions carefully:  1.  Shower with CHG Soap the night before surgery and the  morning of surgery.  2.  If you choose to wash your hair, wash your hair first as usual with your normal  shampoo.  3.  After you shampoo, rinse your hair and body thoroughly to remove the shampoo.                             4.  Use CHG as you would any other liquid soap.  You can apply chg directly to the skin and wash.  Gently with a scrungie or clean washcloth.  5.  Apply the CHG Soap to your body ONLY FROM THE NECK DOWN.   Do   not use on face/ open                           Wound or open sores. Avoid contact with eyes, ears mouth and   genitals (private parts).                       Wash  face,  Genitals (private parts) with your normal soap.             6.  Wash thoroughly, paying special attention to the area where your    surgery  will be performed.  7.  Thoroughly rinse your body with warm water from the neck down.  8.  DO NOT shower/wash with your normal soap after using and rinsing off the CHG Soap.                9.  Pat yourself dry with a clean towel.            10.  Wear clean pajamas.            11.  Place clean sheets on your bed the night of your first shower and do not  sleep with pets. Day of Surgery : Do not apply any lotions/deodorants the morning of surgery.  Please wear clean clothes to the hospital/surgery center.  FAILURE TO FOLLOW THESE INSTRUCTIONS MAY RESULT IN THE CANCELLATION OF YOUR SURGERY  PATIENT SIGNATURE_________________________________  NURSE  SIGNATURE__________________________________  ________________________________________________________________________   Regina Sawyer  An incentive spirometer is a tool that can help keep your lungs clear and active. This tool measures how well you are filling your lungs with each breath. Taking long deep breaths may help reverse or decrease the chance of developing breathing (pulmonary) problems (especially infection) following: A long period of time when you are unable to move or be active. BEFORE THE PROCEDURE  If the spirometer includes an indicator to show your best effort, your nurse or respiratory therapist will set it to a desired goal. If possible, sit up straight or lean slightly forward. Try not to slouch. Hold the incentive spirometer in an upright position. INSTRUCTIONS FOR USE  Sit on the edge of your bed if possible, or sit up as far as you can in bed or on a chair. Hold the incentive spirometer in an upright position. Breathe out normally. Place the mouthpiece in your mouth and seal your lips tightly around it. Breathe in slowly and as deeply as possible, raising the piston or the ball toward the top of the column. Hold your breath for 3-5 seconds or for as long as possible. Allow the piston or ball to fall to the bottom of the column. Remove the mouthpiece from your mouth and breathe out normally. Rest for a few seconds and repeat Steps 1 through 7 at least 10 times every 1-2 hours when you are awake. Take your time and take a few normal breaths between deep breaths. The spirometer may include an indicator to show your best effort. Use the indicator as a goal to work toward during each repetition. After each set of 10 deep breaths, practice coughing to be sure your lungs are clear. If you have an incision (the cut made at the time of surgery), support your incision when coughing by placing a pillow or rolled up towels firmly against it. Once you are able to get out of  bed, walk around indoors and cough well. You may stop using the incentive spirometer when instructed by your caregiver.  RISKS AND COMPLICATIONS Take your time so you do not get dizzy or light-headed. If you are in pain, you may need to take or ask for pain medication before doing incentive spirometry. It is harder to take a deep breath if you are having pain. AFTER USE Rest and breathe slowly and easily. It can be helpful to  keep track of a log of your progress. Your caregiver can provide you with a simple table to help with this. If you are using the spirometer at home, follow these instructions: [place] IF:  You are having difficultly using the spirometer. You have trouble using the spirometer as often as instructed. Your pain medication is not giving enough relief while using the spirometer. You develop fever of 100.5 F (38.1 C) or higher. SEEK IMMEDIATE MEDICAL CARE IF:  You cough up bloody sputum that had not been present before. You develop fever of 102 F (38.9 C) or greater. You develop worsening pain at or near the incision site. MAKE SURE YOU:  Understand these instructions. Will watch your condition. Will get help right away if you are not doing well or get worse. Document Released: 08/15/2006 Document Revised: 06/27/2011 Document Reviewed: 10/16/2006 [facility] Patient Information 2014 [place], [place].   ________________________________________________________________________

## 2021-10-05 NOTE — Progress Notes (Addendum)
COVID Vaccine Completed: yes x5  Date of COVID positive in last 90 days: no  PCP - Allena Earing, MD Cardiologist - no  Medical clearance by Allena Earing 09/27/21 in Epic  Chest x-ray - n/a EKG - 09/27/21 Req Stress Test - n/a ECHO - 01/18/16 Epic Cardiac Cath - n/a Pacemaker/ICD device last checked: n/a Spinal Cord Stimulator: n/a  Bowel Prep - no  Sleep Study - n/a CPAP -   Fasting Blood Sugar - no per pt, doctor is watching Checks Blood Sugar _____ times a day  Blood Thinner Instructions: n/a Aspirin Instructions: Last Dose:  Activity level: Can go up a flight of stairs and perform activities of daily living without stopping and without symptoms of chest pain or shortness of breath.   Anesthesia review:   Patient denies shortness of breath, fever, cough and chest pain at PAT appointment  Patient verbalized understanding of instructions that were given to them at the PAT appointment. Patient was also instructed that they will need to review over the PAT instructions again at home before surgery.

## 2021-10-06 NOTE — Progress Notes (Signed)
HPI: 66 y.o. female presenting today for follow-up evaluation of chronic right heel pain both medial and lateral.  Patient does have a history of open plantar fasciotomy right foot 09/12/2019.  She continues to have pain and tenderness associated to the right heel.  Last visit MRI was ordered.  She presents today to review the MRI results and discuss further treatment options  Past Medical History:  Diagnosis Date   Allergy    Anxiety    Asthma    Atrophic vaginitis 2009   BV (bacterial vaginosis) 2005   Candida vaginitis 2006   Candida vaginitis 2009   Depression    Dysuria 2007   Female pelvic pain 2006   Fibroid 2005   GERD (gastroesophageal reflux disease)    H/O constipation 2009   H/O dyspareunia 2008   H/O urinary frequency 2011   H/O vaginitis 2005   Heart murmur    History of bacterial infection    History of hemorrhoids 2009   Hx: UTI (urinary tract infection) 2007   Hyperlipidemia    controlled    Hypertension    controlled    Irregular periods/menstrual cycles 2007   Libido, decreased 2006   Menopausal symptoms 2007   Nocturia 2009   Proteinuria 2009   Sickle cell trait ([place])    pt denies Sickle cell trait or disease 05-26-2020   Tenderness of female pelvic organs 2005   Ulcer    Vaginal irritation 2011    Past Surgical History:  Procedure Laterality Date   CARPAL TUNNEL RELEASE     both wrists   COLONOSCOPY N/A 10/28/2014   Procedure: COLONOSCOPY;  Surgeon: Inda Castle, MD;  Location: [facility];  Service: Endoscopy;  Laterality: N/A;   COLONOSCOPY     ESOPHAGOGASTRODUODENOSCOPY N/A 08/06/2014   Procedure: ESOPHAGOGASTRODUODENOSCOPY (EGD);  Surgeon: Inda Castle, MD;  Location: [facility];  Service: Endoscopy;  Laterality: N/A;   HEEL SPUR SURGERY Right    POLYPECTOMY     TUBAL LIGATION     UPPER GASTROINTESTINAL ENDOSCOPY      Allergies  Allergen Reactions   Lipitor [Atorvastatin] Other (See Comments)    Abdominal pain, severe nausea,  muscle / body aches   Lactose    Latex Hives, Itching and Rash     Physical Exam: General: The patient is alert and oriented x3 in no acute distress.  Dermatology: Skin is warm, dry and supple bilateral lower extremities. Negative for open lesions or macerations.  Vascular: Palpable pedal pulses bilaterally. Capillary refill within normal limits.  Negative for any significant edema or erythema  Neurological: Light touch and protective threshold grossly intact  Musculoskeletal Exam: There continues to be diffuse pain and tenderness associated to the plantar aspect of the right heel medial and lateral aspects today  Radiographic exam 08/30/2021 RT foot: Plantar calcaneal spurs noted on lateral view.  No fractures identified.  Normal osseous mineralization.  There does appear to be some radiopacity along the plantar surface at the insertion of these spurs.  MR HEEL RT WO CONTRAST 09/11/2021: IMPRESSION: 1. Mild posterior tibial, flexor digitorum longus and flexor hallucis longus tenosynovitis. 2. Mild-to-moderate plantar calcaneal heel spur. No plantar fasciitis. No calcaneal marrow edema. 3. 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 This is greatest within the tarsometatarsal joints.  Assessment: 1.  Chronic recurrent plantar fasciitis and generalized foot pain right  Plan of Care:  1. Patient evaluated.  MRI reviewed 2.  Today we are going to pursue conservative treatment. 3.  Declined  injection 4.  Declined prescription NSAIDs.  Recommend OTC Tylenol or Motrin as needed 5.  Patient takes tramadol on occasion.  Continue as needed 6.  Continue wearing good supportive shoes and arch supports.  Custom orthotics were discussed and recommended today and previous visits however they are not affordable for the patient  7.  Return to clinic as needed     Edrick Kins, DPM [facility]  Dr. Edrick Kins, DPM    [address], [place] [address]                Office [phone]  Fax [phone]

## 2021-10-14 ENCOUNTER — Encounter (HOSPITAL_COMMUNITY)
Admission: RE | Admit: 2021-10-14 | Discharge: 2021-10-14 | Disposition: A | Discharge status: Home / Self Care | Payer: 59 | Source: Ambulatory Visit | Attending: Specialist | Admitting: Specialist

## 2021-10-14 ENCOUNTER — Encounter (HOSPITAL_COMMUNITY): Payer: Self-pay

## 2021-10-14 VITALS — BP 145/78 | HR 64 | Temp 99.0°F | Resp 14 | Ht 62.0 in | Wt 170.0 lb

## 2021-10-14 DIAGNOSIS — Z01812 Encounter for preprocedural laboratory examination: Secondary | ICD-10-CM | POA: Diagnosis present

## 2021-10-14 DIAGNOSIS — I2510 Atherosclerotic heart disease of native coronary artery without angina pectoris: Secondary | ICD-10-CM | POA: Diagnosis not present

## 2021-10-14 DIAGNOSIS — R7303 Prediabetes: Secondary | ICD-10-CM | POA: Insufficient documentation

## 2021-10-14 HISTORY — DX: Unspecified osteoarthritis, unspecified site: M19.90

## 2021-10-14 LAB — BASIC METABOLIC PANEL
Anion gap: 6 (ref 5–15)
BUN: 11 mg/dL (ref 8–23)
CO2: 27 mmol/L (ref 22–32)
Calcium: 9.5 mg/dL (ref 8.9–10.3)
Chloride: 107 mmol/L (ref 98–111)
Creatinine, Ser: 1.18 mg/dL — ABNORMAL HIGH (ref 0.44–1.00)
GFR, Estimated: 52 mL/min — ABNORMAL LOW (ref >60)
Glucose, Bld: 104 mg/dL — ABNORMAL HIGH (ref 70–99)
Potassium: 4.8 mmol/L (ref 3.5–5.1)
Sodium: 140 mmol/L (ref 135–145)

## 2021-10-14 LAB — CBC
HCT: 36.3 % (ref 36.0–46.0)
Hemoglobin: 11.8 g/dL — ABNORMAL LOW (ref 12.0–15.0)
MCH: 27.6 pg (ref 26.0–34.0)
MCHC: 32.5 g/dL (ref 30.0–36.0)
MCV: 84.8 fL (ref 80.0–100.0)
Platelets: 340 10*3/uL (ref 150–400)
RBC: 4.28 MIL/uL (ref 3.87–5.11)
RDW: 14 % (ref 11.5–15.5)
WBC: 5.9 10*3/uL (ref 4.0–10.5)
nRBC: 0 % (ref 0.0–0.2)

## 2021-10-15 ENCOUNTER — Other Ambulatory Visit: Payer: Self-pay | Admitting: Family Medicine

## 2021-10-18 NOTE — Anesthesia Preprocedure Evaluation (Signed)
Anesthesia Evaluation  Patient identified by MRN, date of birth, ID band Patient awake    Reviewed: Allergy & Precautions, NPO status , Patient's Chart, lab work & pertinent test results, reviewed documented beta blocker date and time   History of Anesthesia Complications Negative for: history of anesthetic complications  Airway Mallampati: II  TM Distance: >3 FB Neck ROM: Full    Dental  (+) Edentulous Lower, Edentulous Upper   Pulmonary asthma ,    Pulmonary exam normal        Cardiovascular hypertension, Pt. on medications and Pt. on home beta blockers Normal cardiovascular exam  TTE 2017: EF 60-65%, grade 1 DD, mild MR   Neuro/Psych Anxiety Depression negative neurological ROS     GI/Hepatic Neg liver ROS, GERD  Controlled,  Endo/Other  negative endocrine ROS  Renal/GU negative Renal ROS  negative genitourinary   Musculoskeletal  (+) Arthritis , Rotator cuff tear, acromioclavicular arthrosis   Abdominal   Peds  Hematology  (+) Blood dyscrasia, anemia , Hgb 11.8   Anesthesia Other Findings Day of surgery medications reviewed with patient.  Reproductive/Obstetrics negative OB ROS                            Anesthesia Physical Anesthesia Plan  ASA: 2  Anesthesia Plan: General   Post-op Pain Management: Tylenol PO (pre-op)* and Regional block*   Induction: Intravenous  PONV Risk Score and Plan: 3 and Ondansetron, Dexamethasone, Treatment may vary due to age or medical condition and Midazolam  Airway Management Planned: Oral ETT  Additional Equipment: None  Intra-op Plan:   Post-operative Plan: Extubation in OR  Informed Consent: I have reviewed the patients History and Physical, chart, labs and discussed the procedure including the risks, benefits and alternatives for the proposed anesthesia with the patient or authorized representative who has indicated his/her understanding  and acceptance.     Dental advisory given  Plan Discussed with: CRNA  Anesthesia Plan Comments:        Anesthesia Quick Evaluation

## 2021-10-20 ENCOUNTER — Ambulatory Visit (HOSPITAL_COMMUNITY)
Admission: RE | Admit: 2021-10-20 | Discharge: 2021-10-20 | Disposition: A | Discharge status: Home / Self Care | Payer: 59 | Attending: Specialist | Admitting: Specialist

## 2021-10-20 ENCOUNTER — Ambulatory Visit (HOSPITAL_BASED_OUTPATIENT_CLINIC_OR_DEPARTMENT_OTHER): Payer: 59 | Admitting: Anesthesiology

## 2021-10-20 ENCOUNTER — Other Ambulatory Visit: Payer: Self-pay

## 2021-10-20 ENCOUNTER — Encounter (HOSPITAL_COMMUNITY)
Admission: RE | Disposition: A | Discharge status: Home / Self Care | Payer: Self-pay | Source: Home / Self Care | Attending: Specialist

## 2021-10-20 ENCOUNTER — Encounter (HOSPITAL_COMMUNITY): Payer: Self-pay | Admitting: Specialist

## 2021-10-20 ENCOUNTER — Ambulatory Visit (HOSPITAL_COMMUNITY): Payer: 59 | Admitting: Anesthesiology

## 2021-10-20 DIAGNOSIS — M19011 Primary osteoarthritis, right shoulder: Secondary | ICD-10-CM | POA: Insufficient documentation

## 2021-10-20 DIAGNOSIS — J45909 Unspecified asthma, uncomplicated: Secondary | ICD-10-CM | POA: Diagnosis not present

## 2021-10-20 DIAGNOSIS — Z7722 Contact with and (suspected) exposure to environmental tobacco smoke (acute) (chronic): Secondary | ICD-10-CM | POA: Insufficient documentation

## 2021-10-20 DIAGNOSIS — S46011A Strain of muscle(s) and tendon(s) of the rotator cuff of right shoulder, initial encounter: Secondary | ICD-10-CM

## 2021-10-20 DIAGNOSIS — M75101 Unspecified rotator cuff tear or rupture of right shoulder, not specified as traumatic: Secondary | ICD-10-CM | POA: Diagnosis present

## 2021-10-20 DIAGNOSIS — I10 Essential (primary) hypertension: Secondary | ICD-10-CM | POA: Diagnosis not present

## 2021-10-20 DIAGNOSIS — K219 Gastro-esophageal reflux disease without esophagitis: Secondary | ICD-10-CM | POA: Diagnosis not present

## 2021-10-20 DIAGNOSIS — R7303 Prediabetes: Secondary | ICD-10-CM

## 2021-10-20 HISTORY — PX: SHOULDER ARTHROSCOPY WITH ROTATOR CUFF REPAIR AND SUBACROMIAL DECOMPRESSION: SHX5686

## 2021-10-20 SURGERY — SHOULDER ARTHROSCOPY WITH ROTATOR CUFF REPAIR AND SUBACROMIAL DECOMPRESSION
Anesthesia: General | Laterality: Right

## 2021-10-20 MED ORDER — CHLORHEXIDINE GLUCONATE 0.12 % MT SOLN
15.0000 mL | Freq: Once | OROMUCOSAL | Status: AC
  Administered 2021-10-20: 15 mL via OROMUCOSAL

## 2021-10-20 MED ORDER — BUPIVACAINE LIPOSOME 1.3 % IJ SUSP
INTRAMUSCULAR | Status: DC | PRN
  Administered 2021-10-20: 10 mL via PERINEURAL

## 2021-10-20 MED ORDER — ASPIRIN 81 MG PO TBEC: 81.0000 mg | DELAYED_RELEASE_TABLET | Freq: Every day | ORAL | 0 refills | Status: AC

## 2021-10-20 MED ORDER — SUGAMMADEX SODIUM 200 MG/2ML IV SOLN
INTRAVENOUS | Status: DC | PRN
  Administered 2021-10-20: 200 mg via INTRAVENOUS

## 2021-10-20 MED ORDER — EPHEDRINE 5 MG/ML INJ
INTRAVENOUS | Status: AC
  Filled 2021-10-20: qty 5

## 2021-10-20 MED ORDER — ROCURONIUM BROMIDE 10 MG/ML (PF) SYRINGE
PREFILLED_SYRINGE | INTRAVENOUS | Status: AC
  Filled 2021-10-20: qty 10

## 2021-10-20 MED ORDER — OXYCODONE HCL 5 MG/5ML PO SOLN: 5.0000 mg | Freq: Once | ORAL | Status: DC | PRN

## 2021-10-20 MED ORDER — BUPIVACAINE-EPINEPHRINE 0.5% -1:200000 IJ SOLN
INTRAMUSCULAR | Status: DC | PRN
  Administered 2021-10-20: 6 mL

## 2021-10-20 MED ORDER — EPINEPHRINE PF 1 MG/ML IJ SOLN
INTRAMUSCULAR | Status: AC
  Filled 2021-10-20: qty 1

## 2021-10-20 MED ORDER — DEXAMETHASONE SODIUM PHOSPHATE 4 MG/ML IJ SOLN
INTRAMUSCULAR | Status: DC | PRN
  Administered 2021-10-20: 6 mg via INTRAVENOUS

## 2021-10-20 MED ORDER — ACETAMINOPHEN 500 MG PO TABS
1000.0000 mg | ORAL_TABLET | Freq: Once | ORAL | Status: AC
  Administered 2021-10-20: 1000 mg via ORAL
  Filled 2021-10-20: qty 2

## 2021-10-20 MED ORDER — LIDOCAINE 2% (20 MG/ML) 5 ML SYRINGE
INTRAMUSCULAR | Status: DC | PRN
  Administered 2021-10-20: 60 mg via INTRAVENOUS

## 2021-10-20 MED ORDER — ORAL CARE MOUTH RINSE: 15.0000 mL | Freq: Once | OROMUCOSAL | Status: AC

## 2021-10-20 MED ORDER — DROPERIDOL 2.5 MG/ML IJ SOLN: 0.6250 mg | Freq: Once | INTRAMUSCULAR | Status: DC | PRN

## 2021-10-20 MED ORDER — EPINEPHRINE PF 1 MG/ML IJ SOLN
INTRAMUSCULAR | Status: DC | PRN
  Administered 2021-10-20: 1 mg

## 2021-10-20 MED ORDER — MIDAZOLAM HCL 5 MG/5ML IJ SOLN
INTRAMUSCULAR | Status: DC | PRN
  Administered 2021-10-20: 2 mg via INTRAVENOUS

## 2021-10-20 MED ORDER — TIZANIDINE HCL 4 MG PO CAPS: 4.0000 mg | ORAL_CAPSULE | Freq: Three times a day (TID) | ORAL | 1 refills | Status: DC | PRN

## 2021-10-20 MED ORDER — MIDAZOLAM HCL 2 MG/2ML IJ SOLN
INTRAMUSCULAR | Status: AC
  Filled 2021-10-20: qty 2

## 2021-10-20 MED ORDER — ONDANSETRON HCL 4 MG/2ML IJ SOLN
INTRAMUSCULAR | Status: DC | PRN
  Administered 2021-10-20: 4 mg via INTRAVENOUS

## 2021-10-20 MED ORDER — ROCURONIUM BROMIDE 10 MG/ML (PF) SYRINGE
PREFILLED_SYRINGE | INTRAVENOUS | Status: DC | PRN
  Administered 2021-10-20: 50 mg via INTRAVENOUS

## 2021-10-20 MED ORDER — FENTANYL CITRATE (PF) 100 MCG/2ML IJ SOLN
INTRAMUSCULAR | Status: AC
  Filled 2021-10-20: qty 2

## 2021-10-20 MED ORDER — FENTANYL CITRATE (PF) 100 MCG/2ML IJ SOLN
INTRAMUSCULAR | Status: DC | PRN
Start: 2021-10-20 — End: 2021-10-20
  Administered 2021-10-20: 50 ug via INTRAVENOUS

## 2021-10-20 MED ORDER — ONDANSETRON HCL 4 MG/2ML IJ SOLN
INTRAMUSCULAR | Status: AC
  Filled 2021-10-20: qty 2

## 2021-10-20 MED ORDER — PHENYLEPHRINE 80 MCG/ML (10ML) SYRINGE FOR IV PUSH (FOR BLOOD PRESSURE SUPPORT)
PREFILLED_SYRINGE | INTRAVENOUS | Status: AC
Start: 2021-10-20 — End: ?
  Filled 2021-10-20: qty 10

## 2021-10-20 MED ORDER — ESOMEPRAZOLE MAGNESIUM 40 MG PO CPDR: 40.0000 mg | DELAYED_RELEASE_CAPSULE | Freq: Two times a day (BID) | ORAL | 0 refills | Status: DC

## 2021-10-20 MED ORDER — LACTATED RINGERS IV SOLN: INTRAVENOUS | Status: DC

## 2021-10-20 MED ORDER — LIDOCAINE HCL (PF) 2 % IJ SOLN
INTRAMUSCULAR | Status: AC
  Filled 2021-10-20: qty 5

## 2021-10-20 MED ORDER — POLYETHYLENE GLYCOL 3350 17 G PO PACK: 17.0000 g | PACK | Freq: Every day | ORAL | 0 refills | Status: DC

## 2021-10-20 MED ORDER — CEFAZOLIN SODIUM-DEXTROSE 2-4 GM/100ML-% IV SOLN
2.0000 g | INTRAVENOUS | Status: AC
  Administered 2021-10-20: 2 g via INTRAVENOUS
  Filled 2021-10-20: qty 100

## 2021-10-20 MED ORDER — FENTANYL CITRATE PF 50 MCG/ML IJ SOSY: 25.0000 ug | PREFILLED_SYRINGE | INTRAMUSCULAR | Status: DC | PRN

## 2021-10-20 MED ORDER — BUPIVACAINE-EPINEPHRINE (PF) 0.5% -1:200000 IJ SOLN
INTRAMUSCULAR | Status: AC
  Filled 2021-10-20: qty 30

## 2021-10-20 MED ORDER — SODIUM CHLORIDE 0.9 % IR SOLN
Status: DC | PRN
  Administered 2021-10-20: 12000 mL

## 2021-10-20 MED ORDER — OXYCODONE HCL 5 MG PO TABS: 5.0000 mg | ORAL_TABLET | ORAL | 0 refills | Status: DC | PRN

## 2021-10-20 MED ORDER — EPHEDRINE SULFATE-NACL 50-0.9 MG/10ML-% IV SOSY
PREFILLED_SYRINGE | INTRAVENOUS | Status: DC | PRN
  Administered 2021-10-20 (×2): 5 mg via INTRAVENOUS
  Administered 2021-10-20: 10 mg via INTRAVENOUS

## 2021-10-20 MED ORDER — CEPHALEXIN 500 MG PO CAPS: 500.0000 mg | ORAL_CAPSULE | Freq: Four times a day (QID) | ORAL | 1 refills | Status: DC

## 2021-10-20 MED ORDER — OXYCODONE HCL 5 MG PO TABS: 5.0000 mg | ORAL_TABLET | Freq: Once | ORAL | Status: DC | PRN

## 2021-10-20 MED ORDER — PHENYLEPHRINE 80 MCG/ML (10ML) SYRINGE FOR IV PUSH (FOR BLOOD PRESSURE SUPPORT)
PREFILLED_SYRINGE | INTRAVENOUS | Status: DC | PRN
  Administered 2021-10-20: 80 ug via INTRAVENOUS

## 2021-10-20 MED ORDER — BUPIVACAINE-EPINEPHRINE (PF) 0.5% -1:200000 IJ SOLN
INTRAMUSCULAR | Status: DC | PRN
  Administered 2021-10-20: 15 mL via PERINEURAL

## 2021-10-20 MED ORDER — DEXAMETHASONE SODIUM PHOSPHATE 10 MG/ML IJ SOLN
INTRAMUSCULAR | Status: AC
  Filled 2021-10-20: qty 1

## 2021-10-20 MED ORDER — PROPOFOL 10 MG/ML IV BOLUS
INTRAVENOUS | Status: DC | PRN
  Administered 2021-10-20: 130 mg via INTRAVENOUS

## 2021-10-20 MED ORDER — DOCUSATE SODIUM 100 MG PO CAPS: 100.0000 mg | ORAL_CAPSULE | Freq: Two times a day (BID) | ORAL | 1 refills | Status: DC | PRN

## 2021-10-20 SURGICAL SUPPLY — 79 items
AID PSTN UNV HD RSTRNT DISP (MISCELLANEOUS) ×1
ANCH SUT 2 SWLK 19.1 CLS EYLT (Anchor) ×2 IMPLANT
ANCHOR NDL 9/16 CIR SZ 8 (NEEDLE) IMPLANT
ANCHOR NEEDLE 9/16 CIR SZ 8 (NEEDLE) IMPLANT
ANCHOR SWIVELOCK BIO 4.75X19.1 (Anchor) ×2 IMPLANT
BAG COUNTER SPONGE SURGICOUNT (BAG) IMPLANT
BAG SPNG CNTER NS LX DISP (BAG)
BLADE EXCALIBUR 4.0X13 (MISCELLANEOUS) ×3 IMPLANT
BLADE SHAVER TORPEDO 4X13 (MISCELLANEOUS) ×1 IMPLANT
BLADE SURG SZ11 CARB STEEL (BLADE) ×3 IMPLANT
BURR OVAL 8 FLU 4.0X13 (MISCELLANEOUS) ×1 IMPLANT
CANNULA ACUFO 5X76 (CANNULA) ×4 IMPLANT
CLEANER TIP ELECTROSURG 2X2 (MISCELLANEOUS) IMPLANT
CLSR STERI-STRIP ANTIMIC 1/2X4 (GAUZE/BANDAGES/DRESSINGS) ×1 IMPLANT
COVER SURGICAL LIGHT HANDLE (MISCELLANEOUS) ×3 IMPLANT
DISSECTOR  3.8MM X 13CM (MISCELLANEOUS)
DISSECTOR 3.5MM X 13CM (MISCELLANEOUS) IMPLANT
DISSECTOR 3.8MM X 13CM (MISCELLANEOUS) IMPLANT
DRAPE IMP U-DRAPE 54X76 (DRAPES) ×3 IMPLANT
DRAPE INCISE IOBAN 66X45 STRL (DRAPES) ×1 IMPLANT
DRAPE ORTHO SPLIT 77X108 STRL (DRAPES) ×4
DRAPE POUCH INSTRU U-SHP 10X18 (DRAPES) ×3 IMPLANT
DRAPE STERI 35X30 U-POUCH (DRAPES) ×3 IMPLANT
DRAPE SURG ORHT 6 SPLT 77X108 (DRAPES) ×4 IMPLANT
DRSG AQUACEL AG ADV 3.5X 4 (GAUZE/BANDAGES/DRESSINGS) IMPLANT
DRSG AQUACEL AG ADV 3.5X 6 (GAUZE/BANDAGES/DRESSINGS) ×1 IMPLANT
DRSG PAD ABDOMINAL 8X10 ST (GAUZE/BANDAGES/DRESSINGS) IMPLANT
DURAPREP 26ML APPLICATOR (WOUND CARE) ×3 IMPLANT
DW OUTFLOW CASSETTE/TUBE SET (MISCELLANEOUS) ×3 IMPLANT
ELECT NDL TIP 2.8 STRL (NEEDLE) ×2 IMPLANT
ELECT NEEDLE TIP 2.8 STRL (NEEDLE) ×2 IMPLANT
ELECT REM PT RETURN 15FT ADLT (MISCELLANEOUS) ×3 IMPLANT
FIBER TAPE 2MM (SUTURE) ×1 IMPLANT
FILTER STRAW (MISCELLANEOUS) ×3 IMPLANT
GLOVE BIOGEL PI IND STRL 7.0 (GLOVE) ×2 IMPLANT
GLOVE BIOGEL PI IND STRL 8 (GLOVE) ×2 IMPLANT
GLOVE BIOGEL PI INDICATOR 7.0 (GLOVE) ×1
GLOVE BIOGEL PI INDICATOR 8 (GLOVE) ×1
GLOVE SURG POLYISO LF SZ7.5 (GLOVE) ×6 IMPLANT
GLOVE SURG SS PI 8.0 STRL IVOR (GLOVE) ×6 IMPLANT
GOWN STRL REUS W/ TWL XL LVL3 (GOWN DISPOSABLE) ×4 IMPLANT
GOWN STRL REUS W/TWL XL LVL3 (GOWN DISPOSABLE) ×4
KIT BASIN OR (CUSTOM PROCEDURE TRAY) ×3 IMPLANT
KIT TURNOVER KIT A (KITS) IMPLANT
MANIFOLD NEPTUNE II (INSTRUMENTS) ×3 IMPLANT
NDL SCORPION MULTI FIRE (NEEDLE) IMPLANT
NDL SPNL 18GX3.5 QUINCKE PK (NEEDLE) ×2 IMPLANT
NEEDLE SCORPION MULTI FIRE (NEEDLE) ×2 IMPLANT
NEEDLE SPNL 18GX3.5 QUINCKE PK (NEEDLE) ×2 IMPLANT
PACK SHOULDER (CUSTOM PROCEDURE TRAY) ×3 IMPLANT
PORT APPOLLO RF 90DEGREE MULTI (SURGICAL WAND) ×3 IMPLANT
PROTECTOR NERVE ULNAR (MISCELLANEOUS) ×2 IMPLANT
RESTRAINT HEAD UNIVERSAL NS (MISCELLANEOUS) ×3 IMPLANT
SLING ARM FOAM STRAP LRG (SOFTGOODS) ×1 IMPLANT
SLING ARM IMMOBILIZER LRG (SOFTGOODS) ×1 IMPLANT
SLING ARM IMMOBILIZER MED (SOFTGOODS) IMPLANT
SLING ULTRA II L (ORTHOPEDIC SUPPLIES) IMPLANT
SOLUTION PRONTOSAN WOUND 350ML (IRRIGATION / IRRIGATOR) ×1 IMPLANT
STOPCOCK 4 WAY LG BORE MALE ST (IV SETS) ×1 IMPLANT
STRIP CLOSURE SKIN 1/2X4 (GAUZE/BANDAGES/DRESSINGS) ×1 IMPLANT
SUCTION FRAZIER HANDLE 12FR (TUBING) ×2
SUCTION TUBE FRAZIER 12FR DISP (TUBING) ×2 IMPLANT
SUT ETHIBOND NAB CT1 #1 30IN (SUTURE) IMPLANT
SUT ETHILON 4 0 PS 2 18 (SUTURE) ×3 IMPLANT
SUT FIBERWIRE #2 38 T-5 BLUE (SUTURE)
SUT PROLENE 3 0 PS 2 (SUTURE) ×3 IMPLANT
SUT TIGER TAPE 7 IN WHITE (SUTURE) IMPLANT
SUT VIC AB 0 CT1 27 (SUTURE) ×2
SUT VIC AB 0 CT1 27XBRD ANTBC (SUTURE) ×2 IMPLANT
SUT VIC AB 1-0 CT2 27 (SUTURE) IMPLANT
SUT VIC AB 2-0 CT2 27 (SUTURE) ×1 IMPLANT
SUT VICRYL 0 UR6 27IN ABS (SUTURE) ×3 IMPLANT
SUTURE FIBERWR #2 38 T-5 BLUE (SUTURE) IMPLANT
SYR 20ML LL LF (SYRINGE) ×3 IMPLANT
TAPE FIBER 2MM 7IN #2 BLUE (SUTURE) IMPLANT
TOWEL OR 17X26 10 PK STRL BLUE (TOWEL DISPOSABLE) ×3 IMPLANT
TUBING ARTHROSCOPY IRRIG 16FT (MISCELLANEOUS) ×3 IMPLANT
TUBING CONNECTING 10 (TUBING) ×6 IMPLANT
WIPE CHG 2% PREP (PERSONAL CARE ITEMS) ×3 IMPLANT

## 2021-10-20 NOTE — Op Note (Unsigned)
Regina Sawyer, Regina Sawyer MEDICAL RECORD NO: [id number] ACCOUNT NO: [id number] DATE OF BIRTH: May 15, 1955 FACILITY: [facility] LOCATION: [facility] PHYSICIAN: Johnn Hai, MD  Operative Report   DATE OF PROCEDURE: 10/20/2021  PREOPERATIVE DIAGNOSIS: 1.  Acute rotator cuff tear, right shoulder supraspinatus. 2.  AC arthrosis. 3.  Impingement syndrome.  POSTOPERATIVE DIAGNOSES:   1.  Acute rotator cuff tear, right shoulder supraspinatus. 2.  AC arthrosis. 3.  Impingement syndrome. 4. Glenoid labral tear and biceps tendon tear.  PROCEDURE PERFORMED:   1.  Right shoulder arthroscopy with subacromial decompression, acromioplasty and resection of the CA ligament. 2.  Extensive debridement including subacromial bursectomy, subdeltoid bursectomy, glenoid anterior, posterior debridement as well as biceps tendon debridement. 3.  Arthroscopic distal clavicle resection. 4.  Mini open rotator cuff repair of supraspinatus tendon acute utilizing Arthrex SwiveLock anchors.  ANESTHESIA:  General.  ASSISTANT:  Nehemiah Massed, PA.  HISTORY:  A 66 year old female with refractory shoulder pain despite conservative treatment.  MRI indicating a high-grade tear of the supraspinatus.  She was indicated for shoulder arthroscopy.  She had also severe AC arthrosis.  She was indicated for  subacromial decompression, rotator cuff repair, distal clavicle resection.  Risks and benefits discussed including bleeding, infection, damage to neurovascular structures, no change in symptoms, worsening symptoms, DVT, PE, anesthetic complications, etc.  DESCRIPTION OF PROCEDURE:  With the patient in supine beach chair position.  After induction of adequate general anesthesia, and 2 grams Kefzol, the right shoulder and precordial region and upper extremity was prepped and draped in the usual sterile  fashion.  The patient had normal range of motion.  A surgical marker was utilized to delineate the acromion, AC joint, coracoid.  A  standard posterolateral portal was utilized and incised with a #11 blade through the skin only.  The arthroscopic camera was then advanced into the glenohumeral joint in  line with the coracoid penetrating atraumatically with the arm in the 70/30 position, again 55 mm was utilized to insufflate the joint.  A small tear of the anterior and superior labrum, the biceps tendon attachment with a small tear of the biceps tendon  was noted.  There was some mild glenoid chondromalacia of the posterior cephalad quadrant.  Some tearing of the undersurface of the supraspinatus and subscap was unremarkable.  Under direct visualization, an anterior portal was fashioned. After  localization with 18-gauge needle and fashioned it with a #11 blade.  I advanced the arthroscopic cannula and the glenohumeral joint and introduced a shaver and debrided the labrum anterior, posterior and shaved a small portion of the biceps tendon  approximately 10% of the tear in the posterior half.  After lavaging the joint we then removed the shaver.  I redirected the camera in the subacromial space and I fashioned the anterolateral portal with a cannula.  Triangulating the subacromial space.  I  introduced a shaver and performed a full bursectomy.  There was hypertrophic bursa noted throughout anterior, posterior, subdeltoid and subacromial.  Spur off the anterior aspect of the acromion was noted and a thickened CA ligament using the Arthrowand  to morselize and release the CA ligament.  I then used a bur to remove the spurring off the anterior aspect of the acromion.  I then identified the [facility] joint with a 18-gauge needle.  There was a spur off the acromion on the acromial side of the [facility] joint.   This was removed with a bur.  I then fashioned an anterior portal just in front of  the [facility] joint and introduced the cannula.  From the anterolateral portal, I used a bur just to remove the inferior portion of the clavicle approximately 4 mm.  Then,  from  the anterior portal I removed the rest of the 4 mm section of the clavicle and a portion of the acromion side with a bur.  Extending completely posteriorly.  There was a small spur was extending down to the supraspinatus and this was excised.  Then,  using the Arthrowand to achieve hemostasis.  The distal clavicle was presented into the joint for the resection.  We extended to the cephalad portion of the clavicle, ensuring that was resected as well.  I notice a 1 x 2 cm tear in the supraspinatus.   The tendon of the supraspinatus was debrided arthroscopically.  I then used a bur to prepare a bleeding cancellous bed in the greater tuberosity.  Following this, I lavaged the joint and closed the portals with 4-0 nylon simple sutures.  I made a 2 cm  incision over the anterolateral aspect of the acromion and divided the raphe in line with the incision.  A self-retaining retractor was then placed.  I identified the tear.  I placed one SwiveLock just lateral to the articular surface after piloting a  hole with an awl.  TigerTape was placed into this.  I used 1 leaflet posteromedially, second leaflet anteromedially and one leaflet medially advancing the tendon to cover the footprint in the greater tuberosity.  This was then secured over the lateral  aspect of the greater tuberosity and a second SwiveLock.  After piloting with an awl and advancing the SwiveLock with the suture leaflets within the [place].  The arm in the neutral position without undue tension.  Full coverage was noted, I oversewed  a portion of it with 0 Vicryl in interrupted suture.  Remainder of the cuff was unremarkable.  Following this, copiously irrigated with Prontosan irrigation.  I then closed the raphae with 0 Vicryl interrupted figure-of-eight suture, subcutaneous with  2-0 and skin with Prolene.  Aquacel was placed, placed in a sling, extubated, and transported to the recovery room in satisfactory condition.  The patient  tolerated the procedure well.  No complications.  Assistant, Nehemiah Massed, PA, was used throughout the case, holding the arm in traction in different positions, monitoring inflow and outflow and suction.   PUS D: 10/20/2021 10:46:42 am T: 10/20/2021 11:46:00 am  JOB: [id number]/ [id number]

## 2021-10-20 NOTE — Interval H&P Note (Signed)
History and Physical Interval Note:  10/20/2021 8:26 AM  Regina Sawyer  has presented today for surgery, with the diagnosis of Rotator cuff tear, acromioclavicular arthrosis.  The various methods of treatment have been discussed with the patient and family. After consideration of risks, benefits and other options for treatment, the patient has consented to  Procedure(s): SHOULDER ARTHROSCOPY WITH SUBACROMIAL DECOMPRESSION, MINI OPEN ROTATOR CUFF REPAIR AND DISTAL CLAVICLE RESECTION (Right) as a surgical intervention.  The patient's history has been reviewed, patient examined, no change in status, stable for surgery.  I have reviewed the patient's chart and labs.  Questions were answered to the patient's satisfaction.     Regina Sawyer

## 2021-10-20 NOTE — Transfer of Care (Signed)
Immediate Anesthesia Transfer of Care Note  Patient: Regina Sawyer  Procedure(s) Performed: SHOULDER ARTHROSCOPY WITH SUBACROMIAL DECOMPRESSION, MINI OPEN ROTATOR CUFF REPAIR AND DISTAL CLAVICLE RESECTION (Right)  Patient Location: PACU  Anesthesia Type:General  Level of Consciousness: awake  Airway & Oxygen Therapy: Patient Spontanous Breathing and Patient connected to face mask oxygen  Post-op Assessment: Report given to RN and Post -op Vital signs reviewed and stable  Post vital signs: Reviewed and stable  Last Vitals:  Vitals Value Taken Time  BP 145/84 10/20/21 1054  Temp    Pulse 69 10/20/21 1056  Resp 14 10/20/21 1056  SpO2 98 % 10/20/21 1056  Vitals shown include unvalidated device data.  Last Pain:  Vitals:   10/20/21 0702  TempSrc:   PainSc: 0-No pain         Complications: No notable events documented.

## 2021-10-20 NOTE — Anesthesia Procedure Notes (Signed)
Procedure Name: Intubation Date/Time: 10/20/2021 8:38 AM  Performed by: Lieutenant Diego, CRNAPre-anesthesia Checklist: Patient identified, Emergency Drugs available, Suction available and Patient being monitored Patient Re-evaluated:Patient Re-evaluated prior to induction Oxygen Delivery Method: Circle system utilized Preoxygenation: Pre-oxygenation with 100% oxygen Induction Type: IV induction Ventilation: Mask ventilation without difficulty Laryngoscope Size: Miller and 2 Grade View: Grade I Tube type: Oral Tube size: 7.0 mm Number of attempts: 1 Airway Equipment and Method: Stylet and Oral airway Placement Confirmation: ETT inserted through vocal cords under direct vision, positive ETCO2 and breath sounds checked- equal and bilateral Secured at: 22 cm Tube secured with: Tape Dental Injury: Teeth and Oropharynx as per pre-operative assessment

## 2021-10-20 NOTE — Brief Op Note (Signed)
10/20/2021  8:28 AM  PATIENT:  Regina Sawyer  66 y.o. female  PRE-OPERATIVE DIAGNOSIS:  Rotator cuff tear, acromioclavicular arthrosis  POST-OPERATIVE DIAGNOSIS:  Rotator cuff tear, acromioclavicular arthrosis  PROCEDURE:  Procedure(s): SHOULDER ARTHROSCOPY WITH SUBACROMIAL DECOMPRESSION, MINI OPEN ROTATOR CUFF REPAIR AND DISTAL CLAVICLE RESECTION (Right) DIAGNOSES: Right shoulder, acute traumatic rotator cuff tear.AC arthrosis,labral tear  POST-OPERATIVE DIAGNOSIS: same  PROCEDURE: Open repair acute rotator cuff tear - 23410   OPERATIVE FINDING: Exam under anesthesia: Normal Articular space:  Chondromalacia of the glenoid. Chondral surfaces: Chondromalacia Biceps: Biceps labral tear Subscapularis: Intact  Supraspinatus: Complete tear  Infraspinatus: Intact   SURGEON:  Surgeon(s) and Role:    Susa Day, MD - Primary  PHYSICIAN ASSISTANT:   ASSISTANTSMancel Bale   ANESTHESIA:   general  EBL:  20cc   BLOOD ADMINISTERED:none  DRAINS: none   LOCAL MEDICATIONS USED:  MARCAINE     SPECIMEN:  No Specimen  DISPOSITION OF SPECIMEN:  N/A  COUNTS:  YES  TOURNIQUET:  * No tourniquets in log *  DICTATION: .Other Dictation: Dictation Number   [id number]  PLAN OF CARE: Discharge to home after PACU  PATIENT DISPOSITION:  PACU - hemodynamically stable.   Delay start of Pharmacological VTE agent (>24hrs) due to surgical blood loss or risk of bleeding: no

## 2021-10-20 NOTE — Anesthesia Postprocedure Evaluation (Signed)
Anesthesia Post Note  Patient: Regina Sawyer  Procedure(s) Performed: SHOULDER ARTHROSCOPY WITH SUBACROMIAL DECOMPRESSION, MINI OPEN ROTATOR CUFF REPAIR AND DISTAL CLAVICLE RESECTION (Right)     Patient location during evaluation: PACU Anesthesia Type: General Level of consciousness: awake and alert Pain management: pain level controlled Vital Signs Assessment: post-procedure vital signs reviewed and stable Respiratory status: spontaneous breathing, nonlabored ventilation and respiratory function stable Cardiovascular status: blood pressure returned to baseline Postop Assessment: no apparent nausea or vomiting Anesthetic complications: no   No notable events documented.  Last Vitals:  Vitals:   10/20/21 1115 10/20/21 1130  BP: (!) 159/86 (!) 146/61  Pulse: 76 67  Resp: 20 20  Temp:  (!) 36.4 C  SpO2: 92% 94%    Last Pain:  Vitals:   10/20/21 1130  TempSrc:   PainSc: 0-No pain                 Marthenia Rolling

## 2021-10-20 NOTE — Anesthesia Procedure Notes (Signed)
Anesthesia Regional Block: Interscalene brachial plexus block   Pre-Anesthetic Checklist: , timeout performed,  Correct Patient, Correct Site, Correct Laterality,  Correct Procedure, Correct Position, site marked,  Risks and benefits discussed,  Pre-op evaluation,  At surgeon's request and post-op pain management  Laterality: Right  Prep: Maximum Sterile Barrier Precautions used, chloraprep       Needles:  Injection technique: Single-shot  Needle Type: Echogenic Stimulator Needle     Needle Length: 9cm  Needle Gauge: 22     Additional Needles:   Procedures:,,,, ultrasound used (permanent image in chart),,    Narrative:  Start time: 10/20/2021 8:02 AM End time: 10/20/2021 8:05 AM Injection made incrementally with aspirations every 5 mL.  Performed by: Personally  Anesthesiologist: Brennan Bailey, MD  Additional Notes: Risks, benefits, and alternative discussed. Patient gave consent for procedure. Patient prepped and draped in sterile fashion. Sedation administered, patient remains easily responsive to voice. Relevant anatomy identified with ultrasound guidance. Local anesthetic given in 5cc increments with no signs or symptoms of intravascular injection. No pain or paraesthesias with injection. Patient monitored throughout procedure with signs of LAST or immediate complications. Tolerated well. Ultrasound image placed in chart.  Tawny Asal, MD

## 2021-10-21 ENCOUNTER — Encounter (HOSPITAL_COMMUNITY): Payer: Self-pay | Admitting: Specialist

## 2021-10-22 ENCOUNTER — Encounter: Payer: Self-pay | Admitting: Family Medicine

## 2021-11-02 ENCOUNTER — Other Ambulatory Visit: Payer: Self-pay | Admitting: Family Medicine

## 2021-11-12 ENCOUNTER — Other Ambulatory Visit: Payer: Self-pay | Admitting: Podiatry

## 2021-11-13 ENCOUNTER — Other Ambulatory Visit: Payer: Self-pay | Admitting: Family Medicine

## 2021-11-23 ENCOUNTER — Other Ambulatory Visit: Payer: Self-pay | Admitting: Family Medicine

## 2021-11-25 ENCOUNTER — Ambulatory Visit: Payer: 59 | Admitting: Family Medicine

## 2021-12-16 ENCOUNTER — Other Ambulatory Visit: Payer: Self-pay | Admitting: Family Medicine

## 2021-12-16 ENCOUNTER — Other Ambulatory Visit: Payer: Self-pay | Admitting: Podiatry

## 2021-12-17 NOTE — Telephone Encounter (Signed)
Medication was stopped. Will not renew. I am not sure why I continue to receive requests for this medication.

## 2021-12-27 ENCOUNTER — Ambulatory Visit: Payer: 59 | Admitting: Family Medicine

## 2021-12-30 ENCOUNTER — Other Ambulatory Visit: Payer: Self-pay | Admitting: Podiatry

## 2021-12-30 ENCOUNTER — Other Ambulatory Visit: Payer: 59

## 2021-12-30 ENCOUNTER — Telehealth: Payer: Self-pay

## 2021-12-30 ENCOUNTER — Other Ambulatory Visit (INDEPENDENT_AMBULATORY_CARE_PROVIDER_SITE_OTHER): Payer: 59 | Admitting: Family Medicine

## 2021-12-30 DIAGNOSIS — R7303 Prediabetes: Secondary | ICD-10-CM

## 2021-12-30 LAB — POCT GLYCOSYLATED HEMOGLOBIN (HGB A1C): HbA1c, POC (controlled diabetic range): 5.8 % (ref 0.0–7.0)
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
## 2021-12-30 NOTE — Addendum Note (Signed)
Addended by: Leeanne Rio on: 12/30/2021 10:46 AM   Modules accepted: Orders

## 2021-12-31 ENCOUNTER — Ambulatory Visit: Payer: 59 | Admitting: Family Medicine

## 2022-01-19 ENCOUNTER — Encounter: Payer: Self-pay | Admitting: Family Medicine

## 2022-01-20 ENCOUNTER — Other Ambulatory Visit: Payer: Self-pay | Admitting: Family Medicine

## 2022-01-20 ENCOUNTER — Other Ambulatory Visit: Payer: Self-pay | Admitting: Podiatry

## 2022-01-21 NOTE — Telephone Encounter (Signed)
I do not know why I continue to receive refill requests on this medication.  Patient is no longer taking it.

## 2022-02-03 ENCOUNTER — Other Ambulatory Visit: Payer: Self-pay | Admitting: Family Medicine

## 2022-03-14 ENCOUNTER — Telehealth: Payer: Self-pay | Admitting: Family Medicine

## 2022-03-14 ENCOUNTER — Ambulatory Visit: Payer: 59 | Admitting: Family Medicine

## 2022-03-14 DIAGNOSIS — I10 Essential (primary) hypertension: Secondary | ICD-10-CM

## 2022-03-14 NOTE — Telephone Encounter (Signed)
Patient is calling stating she is not needing a appointment, she is just needing lab orders put in to check her liver, kidneys, and cholesterol. Please call patient to schedule lab appointment once lab orders are placed.   Please Advise.   Thanks!

## 2022-03-18 ENCOUNTER — Other Ambulatory Visit: Payer: Self-pay | Admitting: Family Medicine

## 2022-03-22 NOTE — Telephone Encounter (Signed)
Order placed to check kidneys and liver.  She isn't due for a cholesterol check until April. I don't think her insurance will cover it more than once a year, and she last had it drawn 07/30/21.  Please inform patient Leeanne Rio, MD

## 2022-03-23 NOTE — Telephone Encounter (Signed)
Patient informed of orders placed and will call when she is ready to schedule an appointment.  Regina Sawyer, [place]

## 2022-04-14 ENCOUNTER — Other Ambulatory Visit: Payer: 59

## 2022-04-14 DIAGNOSIS — I10 Essential (primary) hypertension: Secondary | ICD-10-CM

## 2022-04-15 LAB — CMP14+EGFR
ALT: 13 IU/L (ref 0–32)
AST: 13 IU/L (ref 0–40)
Albumin/Globulin Ratio: 1.4 (ref 1.2–2.2)
Albumin: 4.3 g/dL (ref 3.9–4.9)
Alkaline Phosphatase: 106 IU/L (ref 44–121)
BUN/Creatinine Ratio: 9 — ABNORMAL LOW (ref 12–28)
BUN: 10 mg/dL (ref 8–27)
Bilirubin Total: 0.2 mg/dL (ref 0.0–1.2)
CO2: 23 mmol/L (ref 20–29)
Calcium: 10 mg/dL (ref 8.7–10.3)
Chloride: 104 mmol/L (ref 96–106)
Creatinine, Ser: 1.08 mg/dL — ABNORMAL HIGH (ref 0.57–1.00)
Globulin, Total: 3.1 g/dL (ref 1.5–4.5)
Glucose: 135 mg/dL — ABNORMAL HIGH (ref 70–99)
Potassium: 4.3 mmol/L (ref 3.5–5.2)
Sodium: 142 mmol/L (ref 134–144)
Total Protein: 7.4 g/dL (ref 6.0–8.5)
eGFR: 57 mL/min/{1.73_m2} — ABNORMAL LOW (ref >59)

## 2022-04-20 ENCOUNTER — Telehealth: Payer: Self-pay

## 2022-04-20 NOTE — Telephone Encounter (Signed)
Patient calls nurse line requesting lab results.   Will forward to PCP.

## 2022-04-22 ENCOUNTER — Ambulatory Visit (INDEPENDENT_AMBULATORY_CARE_PROVIDER_SITE_OTHER): Payer: 59 | Admitting: Family Medicine

## 2022-04-22 ENCOUNTER — Encounter: Payer: Self-pay | Admitting: Family Medicine

## 2022-04-22 VITALS — BP 157/79 | HR 79 | Temp 98.8°F | Ht 62.0 in | Wt 170.4 lb

## 2022-04-22 DIAGNOSIS — I10 Essential (primary) hypertension: Secondary | ICD-10-CM | POA: Diagnosis not present

## 2022-04-22 DIAGNOSIS — D573 Sickle-cell trait: Secondary | ICD-10-CM | POA: Diagnosis not present

## 2022-04-22 DIAGNOSIS — Z23 Encounter for immunization: Secondary | ICD-10-CM

## 2022-04-22 DIAGNOSIS — F418 Other specified anxiety disorders: Secondary | ICD-10-CM | POA: Diagnosis not present

## 2022-04-22 NOTE — Assessment & Plan Note (Signed)
Blood pressure elevated, including on recheck, likely due to self discontinuing the lisinopril.  Advised her to restart it.  Blood pressure check scheduled for next week with nurse.  Continue carvedilol.

## 2022-04-22 NOTE — Patient Instructions (Signed)
It was great to see you again today!  Bloodwork looks good Restart lisinopril Follow up next week for nurse blood pressure check Follow up with me in 3 months, we will recheck A1c then  Be well, Dr. Ardelia Mems

## 2022-04-22 NOTE — Progress Notes (Signed)
  Date of Visit: 04/22/2022   SUBJECTIVE:   HPI:  Regina Sawyer presents today for follow-up.  Recently came to have her liver and kidneys checked.  Wants to review those results today.  She has been nervous about this.  Hypertension: Currently taking carvedilol 6.25 mg twice daily.  She was previously on lisinopril 20 mg daily, but recently stopped taking that several months ago.  She says she stopped it because she saw on Facebook that it could cause cancer.  Mood and sleep: Currently taking Remeron 15 mg daily.  Reports good sleep and mood with this.  OBJECTIVE:   BP (!) 157/79   Pulse 79   Temp 98.8 F (37.1 C)   Ht '5\' 2"'$  (1.575 m)   Wt 170 lb 6.4 oz (77.3 kg)   SpO2 96%   BMI 31.17 kg/m  Gen: no acute distress, pleasant, cooperative  HEENT: normocephalic, atraumatic  Heart: regular rate and rhythm, no murmur Lungs: clear to auscultation bilaterally, normal work of breathing  Neuro: alert, speech normal, grossly nonfocal  ASSESSMENT/PLAN:   Health maintenance:  -reports she has Annual Wellness Visit scheduled through [employer] -flu shot given today -COVID booster given today  Anxiety disorder, unspecified Doing well just on Remeron.  Continue this medication.  HYPERTENSION, BENIGN ESSENTIAL Blood pressure elevated, including on recheck, likely due to self discontinuing the lisinopril.  Advised her to restart it.  Blood pressure check scheduled for next week with nurse.  Continue carvedilol.  SICKLE CELL TRAIT Noted on problem list, has not caused any issues for patient   Reviewed lab work from last week, reassured patient that renal function looks good and liver tests are normal.  FOLLOW UP: Follow up in 3 mos for hypertension, also check A1c for prediabetes at that time  [place] J. Ardelia Mems, [place]

## 2022-04-22 NOTE — Assessment & Plan Note (Signed)
Noted on problem list, has not caused any issues for patient

## 2022-04-22 NOTE — Assessment & Plan Note (Signed)
Doing well just on Remeron.  Continue this medication.

## 2022-04-23 NOTE — Telephone Encounter (Signed)
Discussed with patient during her visit yesterday Leeanne Rio, MD

## 2022-04-28 ENCOUNTER — Ambulatory Visit: Payer: Self-pay

## 2022-05-09 ENCOUNTER — Other Ambulatory Visit: Payer: Self-pay | Admitting: Podiatry

## 2022-05-09 ENCOUNTER — Other Ambulatory Visit: Payer: Self-pay | Admitting: Family Medicine

## 2022-05-20 ENCOUNTER — Other Ambulatory Visit: Payer: Self-pay | Admitting: Family Medicine

## 2022-05-23 NOTE — Telephone Encounter (Signed)
Please let patient know I am refilling this medication, but she needs to schedule an appointment with nurse clinic to recheck her blood pressure.  She no showed to her last blood pressure check appointment with the nurse team.  Thanks, Leeanne Rio, MD

## 2022-06-01 ENCOUNTER — Other Ambulatory Visit: Payer: Self-pay | Admitting: Podiatry

## 2022-06-01 ENCOUNTER — Other Ambulatory Visit: Payer: Self-pay | Admitting: Family Medicine

## 2022-06-03 ENCOUNTER — Other Ambulatory Visit: Payer: Self-pay | Admitting: Family Medicine

## 2022-06-03 NOTE — Telephone Encounter (Signed)
Medication was stopped

## 2022-06-10 ENCOUNTER — Ambulatory Visit (INDEPENDENT_AMBULATORY_CARE_PROVIDER_SITE_OTHER): Payer: 59

## 2022-06-10 NOTE — Patient Instructions (Signed)

## 2022-06-10 NOTE — Progress Notes (Signed)
  I attempted to contact the patient for her scheduled Virtual Telephone Annual Wellness Visit. No answer. I left a detailed message on the patient's personal voicemail.

## 2022-06-25 ENCOUNTER — Emergency Department (HOSPITAL_COMMUNITY)
Admission: EM | Admit: 2022-06-25 | Discharge: 2022-06-25 | Disposition: A | Discharge status: Home / Self Care | Payer: No Typology Code available for payment source | Attending: Emergency Medicine | Admitting: Emergency Medicine

## 2022-06-25 ENCOUNTER — Emergency Department (HOSPITAL_COMMUNITY): Payer: No Typology Code available for payment source

## 2022-06-25 ENCOUNTER — Other Ambulatory Visit: Payer: Self-pay

## 2022-06-25 DIAGNOSIS — Y92410 Unspecified street and highway as the place of occurrence of the external cause: Secondary | ICD-10-CM | POA: Diagnosis not present

## 2022-06-25 DIAGNOSIS — M79601 Pain in right arm: Secondary | ICD-10-CM | POA: Insufficient documentation

## 2022-06-25 DIAGNOSIS — M899 Disorder of bone, unspecified: Secondary | ICD-10-CM | POA: Insufficient documentation

## 2022-06-25 DIAGNOSIS — M7918 Myalgia, other site: Secondary | ICD-10-CM

## 2022-06-25 DIAGNOSIS — M545 Low back pain, unspecified: Secondary | ICD-10-CM | POA: Insufficient documentation

## 2022-06-25 MED ORDER — ACETAMINOPHEN 325 MG PO TABS
650.0000 mg | ORAL_TABLET | Freq: Once | ORAL | Status: AC
  Administered 2022-06-25: 650 mg via ORAL
  Filled 2022-06-25: qty 2

## 2022-06-25 MED ORDER — DICLOFENAC SODIUM 1 % EX GEL: 2.0000 g | Freq: Four times a day (QID) | CUTANEOUS | 0 refills | Status: AC
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
## 2022-06-25 NOTE — Discharge Instructions (Addendum)
Take Tylenol as directed for pain.  Can also apply Lidoderm gel.  Recheck with your primary care provider.  Return to ER for severe or concerning symptoms.  Your x-ray today shows a bony lesion at L3. This may be benign. Please recheck with your primary care provider.
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
## 2022-06-29 ENCOUNTER — Ambulatory Visit (INDEPENDENT_AMBULATORY_CARE_PROVIDER_SITE_OTHER): Payer: 59 | Admitting: Family Medicine

## 2022-06-29 ENCOUNTER — Encounter: Payer: Self-pay | Admitting: Family Medicine

## 2022-06-29 DIAGNOSIS — M545 Low back pain, unspecified: Secondary | ICD-10-CM | POA: Diagnosis not present

## 2022-06-29 MED ORDER — CYCLOBENZAPRINE HCL 5 MG PO TABS: 5.0000 mg | ORAL_TABLET | Freq: Three times a day (TID) | ORAL | 0 refills | Status: AC | PRN

## 2022-06-29 MED ORDER — OXYCODONE HCL 5 MG PO CAPS: 5.0000 mg | ORAL_CAPSULE | Freq: Four times a day (QID) | ORAL | 0 refills | Status: AC | PRN
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
## 2022-07-08 ENCOUNTER — Other Ambulatory Visit (HOSPITAL_COMMUNITY): Payer: Self-pay

## 2022-07-08 ENCOUNTER — Telehealth: Payer: Self-pay
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
## 2022-07-08 NOTE — Telephone Encounter (Signed)
Rec'd PA request from pharmacy for patients OXY-IR (oxycodone 5mg  capsules).   Insurance prefers: CODEINE SULFATE, ENDOCET OR OXYCODONE/ACETAMINOPHEN, HYDROCODONE BITARTRATE/ ACETAMINOPHEN, HYDROCODONE/IBUPROFEN, HYDROMORPHONE HCL, MORPHINE SULFATE, or OXYCODONE HCL TABS.  Would like to switch to one of these, or continue with prior auth?

## 2022-07-11 NOTE — Telephone Encounter (Signed)
Spoke with pt and she doesn't need it. Grier Czerwinski Kennon Holter, CMA

## 2022-07-19 ENCOUNTER — Other Ambulatory Visit: Payer: Self-pay | Admitting: *Deleted

## 2022-07-21 ENCOUNTER — Other Ambulatory Visit: Payer: Self-pay | Admitting: Family Medicine

## 2022-07-21 MED ORDER — LISINOPRIL 20 MG PO TABS: 20.0000 mg | ORAL_TABLET | Freq: Every day | ORAL | 0 refills | Status: DC

## 2022-07-24 ENCOUNTER — Other Ambulatory Visit: Payer: Self-pay | Admitting: Family Medicine

## 2022-07-25 ENCOUNTER — Ambulatory Visit: Payer: 59 | Admitting: Family Medicine

## 2022-07-29 ENCOUNTER — Other Ambulatory Visit: Payer: Self-pay

## 2022-08-02 MED ORDER — CARVEDILOL 6.25 MG PO TABS: 6.2500 mg | ORAL_TABLET | Freq: Two times a day (BID) | ORAL | 1 refills | Status: DC

## 2022-08-18 ENCOUNTER — Ambulatory Visit: Payer: 59 | Admitting: Family Medicine

## 2022-08-19 ENCOUNTER — Other Ambulatory Visit: Payer: Self-pay | Admitting: Podiatry

## 2022-08-31 ENCOUNTER — Telehealth: Payer: Self-pay | Admitting: Podiatry

## 2022-08-31 NOTE — Telephone Encounter (Signed)
Pt asking for a note to excuse he from [employer] duty. Pt stated her feet hurt and she can't stand up for a long time.   Please advise

## 2022-09-01 ENCOUNTER — Encounter: Payer: Self-pay | Admitting: Podiatry

## 2022-09-05 ENCOUNTER — Other Ambulatory Visit: Payer: Self-pay | Admitting: Family Medicine

## 2022-09-05 ENCOUNTER — Ambulatory Visit: Payer: 59 | Admitting: Podiatry

## 2022-09-20 ENCOUNTER — Other Ambulatory Visit: Payer: Self-pay | Admitting: Family Medicine

## 2022-10-01 ENCOUNTER — Other Ambulatory Visit: Payer: Self-pay | Admitting: Podiatry

## 2022-10-01 ENCOUNTER — Other Ambulatory Visit: Payer: Self-pay | Admitting: Family Medicine

## 2022-10-03 ENCOUNTER — Other Ambulatory Visit: Payer: Self-pay

## 2022-10-03 ENCOUNTER — Encounter: Payer: Self-pay | Admitting: Family Medicine

## 2022-10-03 ENCOUNTER — Ambulatory Visit (INDEPENDENT_AMBULATORY_CARE_PROVIDER_SITE_OTHER): Payer: 59 | Admitting: Family Medicine

## 2022-10-03 VITALS — BP 156/72 | HR 60 | Ht 62.0 in | Wt 163.8 lb

## 2022-10-03 DIAGNOSIS — I10 Essential (primary) hypertension: Secondary | ICD-10-CM | POA: Diagnosis not present

## 2022-10-03 DIAGNOSIS — F418 Other specified anxiety disorders: Secondary | ICD-10-CM

## 2022-10-03 MED ORDER — LISINOPRIL 40 MG PO TABS: 40.0000 mg | ORAL_TABLET | Freq: Every day | ORAL | 0 refills | Status: DC

## 2022-10-03 NOTE — Patient Instructions (Addendum)
It was great to see you again today.  Increase lisinopril to 40mg  daily  Follow up in 1 week for nurse blood pressure check and lab visit - can also just follow up with Dr. Raymondo Band for these  Be well, Dr. Pollie Meyer

## 2022-10-03 NOTE — Progress Notes (Unsigned)
  Date of Visit: 10/03/2022   SUBJECTIVE:   HPI:  Regina Sawyer presents today for follow-up of hypertension.  Hypertension: Currently taking carvedilol 6.25 mg twice daily and lisinopril 20 mg daily.  Tolerating these medications well.  She did take her blood pressure medicine this morning.  She does not check her blood pressure at home.  She does have a wrist monitor but does not know how to use it.  Anxiety: Currently taking mirtazapine 15 mg at bedtime.  Previously was also taking Lexapro, but was tapered off of this.  Reports things are going well.  She is sleeping well with the Remeron.  OBJECTIVE:   BP (!) 156/72   Pulse 60   Ht 5\' 2"  (1.575 m)   Wt 163 lb 12.8 oz (74.3 kg)   SpO2 96%   BMI 29.96 kg/m  Gen: No acute distress, pleasant, cooperative HEENT: Normocephalic, atraumatic Heart: Regular rate and rhythm, no murmur Lungs: Clear to auscultation bilaterally, normal effort Neuro: Grossly nonfocal, speech normal Ext: No edema  ASSESSMENT/PLAN:   HYPERTENSION, BENIGN ESSENTIAL Uncontrolled even on recheck.  Patient request increase in medication.  Increase lisinopril to 40 mg daily.  Follow-up in 1 week to recheck blood pressure and assess BMET to ensure stability of creatinine and electrolytes.  Anxiety disorder, unspecified Doing well off of Lexapro.  Continue with Remeron.  FOLLOW UP: Follow up in 1 week for BP recheck and BMET  [place] J. Pollie Meyer, MD [facility]

## 2022-10-05 NOTE — Assessment & Plan Note (Signed)
Uncontrolled even on recheck.  Patient request increase in medication.  Increase lisinopril to 40 mg daily.  Follow-up in 1 week to recheck blood pressure and assess BMET to ensure stability of creatinine and electrolytes.

## 2022-10-05 NOTE — Assessment & Plan Note (Signed)
Doing well off of Lexapro.  Continue with Remeron.

## 2022-10-10 ENCOUNTER — Ambulatory Visit (INDEPENDENT_AMBULATORY_CARE_PROVIDER_SITE_OTHER): Payer: 59

## 2022-10-10 ENCOUNTER — Other Ambulatory Visit: Payer: 59

## 2022-10-10 VITALS — BP 136/78 | HR 77

## 2022-10-10 DIAGNOSIS — I10 Essential (primary) hypertension: Secondary | ICD-10-CM

## 2022-10-10 DIAGNOSIS — Z0130 Encounter for examination of blood pressure without abnormal findings: Secondary | ICD-10-CM

## 2022-10-10 NOTE — Progress Notes (Signed)
Patient presents to nurse clinic this morning for BP check.   Last BP on 10/03/22 was 147/73 and 156/72.  Today's BP is 136/78, HR: 77, SPO2: 97%  Patient reports compliance with BP medications and reports that she is feeling better than she was last week.   Assisted patient to lab for BMP.   Advised that we would reach out with these results and any further recommendations from PCP.   Veronda Prude, RN

## 2022-10-10 NOTE — Progress Notes (Signed)
Normal BP, await bmet, otherwise continue present medications. Latrelle Dodrill, MD

## 2022-10-11 LAB — BASIC METABOLIC PANEL
BUN/Creatinine Ratio: 10 — ABNORMAL LOW (ref 12–28)
BUN: 11 mg/dL (ref 8–27)
CO2: 24 mmol/L (ref 20–29)
Calcium: 10 mg/dL (ref 8.7–10.3)
Chloride: 103 mmol/L (ref 96–106)
Creatinine, Ser: 1.13 mg/dL — ABNORMAL HIGH (ref 0.57–1.00)
Glucose: 110 mg/dL — ABNORMAL HIGH (ref 70–99)
Potassium: 4.4 mmol/L (ref 3.5–5.2)
Sodium: 139 mmol/L (ref 134–144)
eGFR: 54 mL/min/{1.73_m2} — ABNORMAL LOW (ref >59)

## 2022-11-16 ENCOUNTER — Other Ambulatory Visit: Payer: Self-pay | Admitting: Family Medicine

## 2022-11-26 ENCOUNTER — Other Ambulatory Visit: Payer: Self-pay | Admitting: Family Medicine

## 2022-12-01 ENCOUNTER — Other Ambulatory Visit: Payer: Self-pay | Admitting: Family Medicine

## 2022-12-04 ENCOUNTER — Ambulatory Visit (HOSPITAL_COMMUNITY)
Admission: EM | Admit: 2022-12-04 | Discharge: 2022-12-04 | Disposition: A | Discharge status: Home / Self Care | Payer: 59 | Attending: Family Medicine | Admitting: Family Medicine

## 2022-12-04 ENCOUNTER — Encounter (HOSPITAL_COMMUNITY): Payer: Self-pay

## 2022-12-04 DIAGNOSIS — J069 Acute upper respiratory infection, unspecified: Secondary | ICD-10-CM

## 2022-12-04 DIAGNOSIS — U071 COVID-19: Secondary | ICD-10-CM | POA: Insufficient documentation

## 2022-12-04 DIAGNOSIS — J4520 Mild intermittent asthma, uncomplicated: Secondary | ICD-10-CM | POA: Diagnosis not present

## 2022-12-04 LAB — POCT INFLUENZA A/B
Influenza A, POC: NEGATIVE
Influenza B, POC: NEGATIVE

## 2022-12-04 MED ORDER — GUAIFENESIN ER 1200 MG PO TB12: 1200.0000 mg | ORAL_TABLET | Freq: Two times a day (BID) | ORAL | 0 refills | Status: DC

## 2022-12-04 MED ORDER — ALBUTEROL SULFATE HFA 108 (90 BASE) MCG/ACT IN AERS
1.0000 | INHALATION_SPRAY | Freq: Four times a day (QID) | RESPIRATORY_TRACT | 0 refills | Status: DC | PRN
Start: 2022-12-04 — End: 2023-10-09

## 2022-12-04 MED ORDER — BENZONATATE 100 MG PO CAPS: 100.0000 mg | ORAL_CAPSULE | Freq: Three times a day (TID) | ORAL | 0 refills | Status: DC

## 2022-12-04 MED ORDER — ACETAMINOPHEN 325 MG PO TABS
975.0000 mg | ORAL_TABLET | Freq: Once | ORAL | Status: AC
  Administered 2022-12-04: 975 mg via ORAL

## 2022-12-04 MED ORDER — ACETAMINOPHEN 325 MG PO TABS
ORAL_TABLET | ORAL | Status: AC
  Filled 2022-12-04: qty 3

## 2022-12-04 NOTE — Discharge Instructions (Addendum)
Your symptoms are most likely due to a viral illness, which will improve on its own with rest and fluids. Flu test is negative.  COVID testing is pending, staff will call you if this is positive. Wear a mask for 5 days of symptoms while you are in public, then you may remove your mask. You may go back to work if you do not have a fever for 24 hours without any medicines.  - Take prescribed medicines to help with symptoms: tessalon perles, albuterol inhaler as needed for shortness of breath/wheeze - Use over the counter medicines to help with symptoms as discussed: Tylenol, guaifenesin (mucinex), zyrtec, etc - Two teaspoons of honey in warm water every 4-6 hours may help with throat pains - Humidifier in your room at night to help add water the air and soothe cough  If you develop any new or worsening symptoms or do not improve in the next 2 to 3 days, please return.  If your symptoms are severe, please go to the emergency room.  Follow-up with PCP as needed.

## 2022-12-04 NOTE — ED Triage Notes (Signed)
Cough, congestion, runny nose, chills, and productive cough onset 3 days ago. No known sick exposure.   Patient has taken otc cough medication with no relief.
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
## 2022-12-05 ENCOUNTER — Telehealth: Payer: Self-pay | Admitting: Emergency Medicine

## 2022-12-05 LAB — SARS CORONAVIRUS 2 (TAT 6-24 HRS): SARS Coronavirus 2: POSITIVE — AB

## 2022-12-05 MED ORDER — MOLNUPIRAVIR EUA 200MG CAPSULE: 4.0000 | ORAL_CAPSULE | Freq: Two times a day (BID) | ORAL | 0 refills | Status: AC

## 2022-12-27 ENCOUNTER — Ambulatory Visit (INDEPENDENT_AMBULATORY_CARE_PROVIDER_SITE_OTHER): Payer: 59 | Admitting: *Deleted

## 2022-12-27 DIAGNOSIS — Z0000 Encounter for general adult medical examination without abnormal findings: Secondary | ICD-10-CM

## 2022-12-27 NOTE — Progress Notes (Signed)
Subjective:   Regina Sawyer is a 67 y.o. female who presents for Medicare Annual (Subsequent) preventive examination.  Visit Complete: Virtual  I connected with  Regina Sawyer on 12/27/22 by a audio enabled telemedicine application and verified that I am speaking with the correct person using two identifiers.  Patient Location: Home  Provider Location: Home Office  I discussed the limitations of evaluation and management by telemedicine. The patient expressed understanding and agreed to proceed.  Vital Signs: Unable to obtain new vitals due to this being a telehealth visit.   Review of Systems     Cardiac Risk Factors include: advanced age (>21men, >84 women);hypertension     Objective:    There were no vitals filed for this visit. There is no height or weight on file to calculate BMI.     12/27/2022    2:11 PM 06/29/2022   10:13 AM 06/25/2022    1:30 PM 10/20/2021    6:56 AM 10/14/2021   10:49 AM 09/27/2021   11:23 AM 07/27/2021   11:07 AM  Advanced Directives  Does Patient Have a Medical Advance Directive? No No No No No No No  Would patient like information on creating a medical advance directive? No - Patient declined    No - Patient declined  No - Patient declined    Current Medications (verified) Outpatient Encounter Medications as of 12/27/2022  Medication Sig   albuterol (VENTOLIN HFA) 108 (90 Base) MCG/ACT inhaler Inhale 1-2 puffs into the lungs every 6 (six) hours as needed for wheezing or shortness of breath.   benzonatate (TESSALON) 100 MG capsule Take 1 capsule (100 mg total) by mouth every 8 (eight) hours.   carvedilol (COREG) 6.25 MG tablet TAKE 1 TABLET BY MOUTH TWICE  DAILY WITH A MEAL   cyclobenzaprine (FLEXERIL) 5 MG tablet Take 1 tablet (5 mg total) by mouth 3 (three) times daily as needed for muscle spasms.   diclofenac Sodium (VOLTAREN) 1 % GEL Apply 2 g topically 4 (four) times daily.   Guaifenesin 1200 MG TB12 Take 1 tablet (1,200 mg total) by mouth  in the morning and at bedtime.   lisinopril (ZESTRIL) 40 MG tablet Take 1 tablet (40 mg total) by mouth daily.   mirtazapine (REMERON) 15 MG tablet TAKE 1 TABLET (15 MG TOTAL) BY MOUTH DAILY.   rosuvastatin (CRESTOR) 20 MG tablet TAKE 1 TABLET BY MOUTH DAILY   No facility-administered encounter medications on file as of 12/27/2022.    Allergies (verified) Lipitor [atorvastatin], Lactose, and Latex   History: Past Medical History:  Diagnosis Date   Allergy    Anxiety    Arthritis    Asthma    Atrophic vaginitis 2009   BV (bacterial vaginosis) 2005   Candida vaginitis 2006   Candida vaginitis 2009   Depression    Dysuria 2007   Female pelvic pain 2006   Fibroid 2005   GERD (gastroesophageal reflux disease)    H/O constipation 2009   H/O dyspareunia 2008   H/O urinary frequency 2011   H/O vaginitis 2005   Heart murmur    History of bacterial infection    History of hemorrhoids 2009   Hx: UTI (urinary tract infection) 2007   Hyperlipidemia    controlled    Hypertension    controlled    Irregular periods/menstrual cycles 2007   Libido, decreased 2006   Menopausal symptoms 2007   Nocturia 2009   Proteinuria 2009   Sickle cell trait (HCC)  pt denies Sickle cell trait or disease 05-26-2020   Tenderness of female pelvic organs 2005   Ulcer    Vaginal irritation 2011   Past Surgical History:  Procedure Laterality Date   CARPAL TUNNEL RELEASE     both wrists   COLONOSCOPY N/A 10/28/2014   Procedure: COLONOSCOPY;  Surgeon: Louis Meckel, MD;  Location: [facility];  Service: Endoscopy;  Laterality: N/A;   COLONOSCOPY     DENTAL SURGERY     ESOPHAGOGASTRODUODENOSCOPY N/A 08/06/2014   Procedure: ESOPHAGOGASTRODUODENOSCOPY (EGD);  Surgeon: Louis Meckel, MD;  Location: [facility];  Service: Endoscopy;  Laterality: N/A;   HEEL SPUR SURGERY Right    POLYPECTOMY     SHOULDER ARTHROSCOPY WITH ROTATOR CUFF REPAIR AND SUBACROMIAL DECOMPRESSION Right 10/20/2021    Procedure: SHOULDER ARTHROSCOPY WITH SUBACROMIAL DECOMPRESSION, MINI OPEN ROTATOR CUFF REPAIR AND DISTAL CLAVICLE RESECTION;  Surgeon: Jene Every, MD;  Location: [facility];  Service: Orthopedics;  Laterality: Right;   TUBAL LIGATION     UPPER GASTROINTESTINAL ENDOSCOPY     Family History  Problem Relation Age of Onset   Diabetes Sister    Hypertension Sister    Heart failure Father    Heart failure Brother        x2   Gallstones Mother    Colon polyps Neg Hx    Kidney disease Neg Hx    Esophageal cancer Neg Hx    Gallbladder disease Neg Hx    Rectal cancer Neg Hx    Stomach cancer Neg Hx    Social History   Socioeconomic History   Marital status: Married    Spouse name: Not on file   Number of children: 2   Years of education: Not on file   Highest education level: Not on file  Occupational History   Occupation: Health visitor  Tobacco Use   Smoking status: Never    Passive exposure: Yes   Smokeless tobacco: Never  Vaping Use   Vaping status: Never Used  Substance and Sexual Activity   Alcohol use: No   Drug use: No   Sexual activity: Not on file    Comment: BTL  Other Topics Concern   Not on file  Social History Narrative   Not on file   Social Determinants of Health   Financial Resource Strain: Low Risk  (12/27/2022)   Overall Financial Resource Strain (CARDIA)    Difficulty of Paying Living Expenses: Not very hard  Food Insecurity: No Food Insecurity (12/27/2022)   Hunger Vital Sign    Worried About Running Out of Food in the Last Year: Never true    Ran Out of Food in the Last Year: Never true  Transportation Needs: No Transportation Needs (12/27/2022)   PRAPARE - Administrator, Civil Service (Medical): No    Lack of Transportation (Non-Medical): No  Physical Activity: Inactive (12/27/2022)   Exercise Vital Sign    Days of Exercise per Week: 0 days    Minutes of Exercise per Session: 0 min  Stress: No Stress Concern Present (12/27/2022)    [employer] of Occupational Health - Occupational Stress Questionnaire    Feeling of Stress : Only a little  Social Connections: Unknown (12/27/2022)   Social Connection and Isolation Panel [NHANES]    Frequency of Communication with Friends and Family: Three times a week    Frequency of Social Gatherings with Friends and Family: Twice a week    Attends Religious Services: More than 4 times per  year    Active Member of Clubs or Organizations: No    Attends Banker Meetings: Never    Marital Status: Patient unable to answer    Tobacco Counseling Counseling given: Not Answered   Clinical Intake:  Pre-visit preparation completed: Yes  Pain : No/denies pain     Diabetes: No  How often do you need to have someone help you when you read instructions, pamphlets, or other written materials from your doctor or pharmacy?: 1 - Never  Interpreter Needed?: No  Information entered by :: Remi Haggard LPN   Activities of Daily Living    12/27/2022    2:13 PM  In your present state of health, do you have any difficulty performing the following activities:  Hearing? 0  Vision? 0  Difficulty concentrating or making decisions? 0  Walking or climbing stairs? 0  Dressing or bathing? 0  Doing errands, shopping? 0  Preparing Food and eating ? N  Using the Toilet? N  In the past six months, have you accidently leaked urine? N  Do you have problems with loss of bowel control? N  Managing your Medications? N  Managing your Finances? N    Patient Care Team: Latrelle Dodrill, MD as PCP - General (Family Medicine) Linna Darner, RD as Dietitian (Family Medicine)  Indicate any recent Medical Services you may have received from other than [facility] providers in the past year (date may be approximate).     Assessment:   This is a routine wellness examination for Regina Sawyer.  Hearing/Vision screen Hearing Screening - Comments:: Not up to date Vision Screening - Comments:: Not  up to date [facility]   Goals Addressed             This Visit's Progress    Patient Stated       Work less       Depression Screen    12/27/2022    2:16 PM 10/03/2022   10:31 AM 06/29/2022   10:13 AM 04/22/2022    2:45 PM 07/27/2021   11:06 AM 01/13/2020    5:03 PM 08/07/2017    1:43 PM  PHQ 2/9 Scores  PHQ - 2 Score 0 0 0 0 0 0 0  PHQ- 9 Score 0 0 0 0 0 0     Fall Risk    12/27/2022    2:11 PM 07/27/2021   11:06 AM 08/07/2017    1:43 PM 08/09/2016    9:46 AM 09/07/2015    3:21 PM  Fall Risk   Falls in the past year? 0 0 No No No  Number falls in past yr: 0 0     Injury with Fall? 0 0     Follow up Falls evaluation completed;Education provided;Falls prevention discussed        MEDICARE RISK AT HOME: Medicare Risk at Home Any stairs in or around the home?: No If so, are there any without handrails?: No Home free of loose throw rugs in walkways, pet beds, electrical cords, etc?: Yes Adequate lighting in your home to reduce risk of falls?: Yes Life alert?: No Use of a cane, walker or w/c?: No Grab bars in the bathroom?: No Shower chair or bench in shower?: No Elevated toilet seat or a handicapped toilet?: No  TIMED UP AND GO:  Was the test performed?  No    Cognitive Function:        12/27/2022    2:14 PM  6CIT Screen  What  Year? 0 points  What month? 0 points  What time? 0 points  Count back from 20 2 points  Months in reverse 4 points  Repeat phrase 6 points  Total Score 12 points    Immunizations Immunization History  Administered Date(s) Administered   COVID-19, mRNA, vaccine(Comirnaty)12 years and older 04/22/2022   Influenza Whole 02/06/2008, 02/13/2009   Influenza,inj,Quad PF,6+ Mos 01/10/2014, 01/13/2020, 03/30/2021, 04/22/2022   Influenza-Unspecified 01/13/2020   PFIZER Comirnaty(Gray Top)Covid-19 Tri-Sucrose Vaccine 10/15/2020   PFIZER(Purple Top)SARS-COV-2 Vaccination 06/15/2019, 07/06/2019, 04/22/2020   Pfizer Covid-19 Vaccine  Bivalent Booster 62yrs & up 07/27/2021   Td 11/20/2002   Tdap 12/04/2014   Zoster Recombinant(Shingrix) 07/29/2021, 10/07/2021    TDAP status: Up to date  Flu Vaccine status: Due, Education has been provided regarding the importance of this vaccine. Advised may receive this vaccine at local pharmacy or Health Dept. Aware to provide a copy of the vaccination record if obtained from local pharmacy or Health Dept. Verbalized acceptance and understanding.    Covid-19 vaccine status: Information provided on how to obtain vaccines.   Qualifies for Shingles Vaccine? No   Zostavax completed No   Shingrix Completed?: Yes  Screening Tests Health Maintenance  Topic Date Due   COVID-19 Vaccine (8 - 2023-24 season) 01/03/2024 (Originally 12/18/2022)   INFLUENZA VACCINE  01/10/2024 (Originally 11/17/2022)   Medicare Annual Wellness (AWV)  12/27/2023   PAP SMEAR-Modifier  01/08/2024   MAMMOGRAM  01/20/2024   DTaP/Tdap/Td (3 - Td or Tdap) 12/03/2024   Colonoscopy  06/09/2025   Hepatitis C Screening  Completed   HIV Screening  Completed   Zoster Vaccines- Shingrix  Completed   HPV VACCINES  Aged Out    Health Maintenance  There are no preventive care reminders to display for this patient.   Colorectal cancer screening: Type of screening: Colonoscopy. Completed 2022. Repeat every 5 years  Mammogram status: Completed  . Repeat every year    Lung Cancer Screening: (Low Dose CT Chest recommended if Age 65-80 years, 20 pack-year currently smoking OR have quit w/in 15years.) does not qualify.   Lung Cancer Screening Referral:   Additional Screening:  Hepatitis C Screening: does not qualify; Completed 2011  Vision Screening: Recommended annual ophthalmology exams for early detection of glaucoma and other disorders of the eye. Is the patient up to date with their annual eye exam?  No  Who is the provider or what is the name of the office in which the patient attends annual eye exams?  [facility]  will call to schedule If pt is not established with a provider, would they like to be referred to a provider to establish care? No .   Dental Screening: Recommended annual dental exams for proper oral hygiene    Community Resource Referral / Chronic Care Management: CRR required this visit?  No   CCM required this visit?  No     Plan:     I have personally reviewed and noted the following in the patient's chart:   Medical and social history Use of alcohol, tobacco or illicit drugs  Current medications and supplements including opioid prescriptions. Patient is not currently taking opioid prescriptions. Functional ability and status Nutritional status Physical activity Advanced directives List of other physicians Hospitalizations, surgeries, and ER visits in previous 12 months Vitals Screenings to include cognitive, depression, and falls Referrals and appointments  In addition, I have reviewed and discussed with patient certain preventive protocols, quality metrics, and best practice recommendations. A written personalized care  plan for preventive services as well as general preventive health recommendations were provided to patient.     Remi Haggard, LPN   [id number]   After Visit Summary: (MyChart) Due to this being a telephonic visit, the after visit summary with patients personalized plan was offered to patient via MyChart   Nurse Notes:

## 2022-12-27 NOTE — Patient Instructions (Signed)
Ms. Regina Sawyer , Thank you for taking time to come for your Medicare Wellness Visit. I appreciate your ongoing commitment to your health goals. Please review the following plan we discussed and let me know if I can assist you in the future.   Screening recommendations/referrals: Colonoscopy: up to date Mammogram: up to date  Recommended yearly ophthalmology/optometry visit for glaucoma screening and checkup Recommended yearly dental visit for hygiene and checkup  Vaccinations: Influenza vaccine: Education provided Tdap vaccine: up to date Shingles vaccine:   up to date  Advanced directives: Education provided     Preventive Care 6 40-64 Years and Older, Female Preventive care refers to lifestyle choices and visits with your health care provider that can promote health and wellness. What does preventive care include? A yearly physical exam. This is also called an annual well check. Dental exams once or twice a year. Routine eye exams. Ask your health care provider how often you should have your eyes checked. Personal lifestyle choices, including: Daily care of your teeth and gums. Regular physical activity. Eating a healthy diet. Avoiding tobacco and drug use. Limiting alcohol use. Practicing safe sex. Taking low-dose aspirin every day. Taking vitamin and mineral supplements as recommended by your health care provider. What happens during an annual well check? The services and screenings done by your health care provider during your annual well check will depend on your age, overall health, lifestyle risk factors, and family history of disease. Counseling  Your health care provider may ask you questions about your: Alcohol use. Tobacco use. Drug use. Emotional well-being. Home and relationship well-being. Sexual activity. Eating habits. History of falls. Memory and ability to understand (cognition). Work and work Astronomer. Reproductive health. Screening  You may have  the following tests or measurements: Height, weight, and BMI. Blood pressure. Lipid and cholesterol levels. These may be checked every 5 years, or more frequently if you are over 97 years old. Skin check. Lung cancer screening. You may have this screening every year starting at age 82 if you have a 30-pack-year history of smoking and currently smoke or have quit within the past 15 years. Fecal occult blood test (FOBT) of the stool. You may have this test every year starting at age 38. Flexible sigmoidoscopy or colonoscopy. You may have a sigmoidoscopy every 5 years or a colonoscopy every 10 years starting at age 102. Hepatitis C blood test. Hepatitis B blood test. Sexually transmitted disease (STD) testing. Diabetes screening. This is done by checking your blood sugar (glucose) after you have not eaten for a while (fasting). You may have this done every 1-3 years. Bone density scan. This is done to screen for osteoporosis. You may have this done starting at age 80. Mammogram. This may be done every 1-2 years. Talk to your health care provider about how often you should have regular mammograms. Talk with your health care provider about your test results, treatment options, and if necessary, the need for more tests. Vaccines  Your health care provider may recommend certain vaccines, such as: Influenza vaccine. This is recommended every year. Tetanus, diphtheria, and acellular pertussis (Tdap, Td) vaccine. You may need a Td booster every 10 years. Zoster vaccine. You may need this after age 74. Pneumococcal 13-valent conjugate (PCV13) vaccine. One dose is recommended after age 52. Pneumococcal polysaccharide (PPSV23) vaccine. One dose is recommended after age 58. Talk to your health care provider about which screenings and vaccines you need and how often you need them. This information is not  intended to replace advice given to you by your health care provider. Make sure you discuss any questions  you have with your health care provider. Document Released: 05/01/2015 Document Revised: 12/23/2015 Document Reviewed: 02/03/2015 Elsevier Interactive Patient Education  2017 [employer].  Fall Prevention in the Home Falls can cause injuries. They can happen to people of all ages. There are many things you can do to make your home safe and to help prevent falls. What can I do on the outside of my home? Regularly fix the edges of walkways and driveways and fix any cracks. Remove anything that might make you trip as you walk through a door, such as a raised step or threshold. Trim any bushes or trees on the path to your home. Use bright outdoor lighting. Clear any walking paths of anything that might make someone trip, such as rocks or tools. Regularly check to see if handrails are loose or broken. Make sure that both sides of any steps have handrails. Any raised decks and porches should have guardrails on the edges. Have any leaves, snow, or ice cleared regularly. Use sand or salt on walking paths during winter. Clean up any spills in your garage right away. This includes oil or grease spills. What can I do in the bathroom? Use night lights. Install grab bars by the toilet and in the tub and shower. Do not use towel bars as grab bars. Use non-skid mats or decals in the tub or shower. If you need to sit down in the shower, use a plastic, non-slip stool. Keep the floor dry. Clean up any water that spills on the floor as soon as it happens. Remove soap buildup in the tub or shower regularly. Attach bath mats securely with double-sided non-slip rug tape. Do not have throw rugs and other things on the floor that can make you trip. What can I do in the bedroom? Use night lights. Make sure that you have a light by your bed that is easy to reach. Do not use any sheets or blankets that are too big for your bed. They should not hang down onto the floor. Have a firm chair that has side arms. You  can use this for support while you get dressed. Do not have throw rugs and other things on the floor that can make you trip. What can I do in the kitchen? Clean up any spills right away. Avoid walking on wet floors. Keep items that you use a lot in easy-to-reach places. If you need to reach something above you, use a strong step stool that has a grab bar. Keep electrical cords out of the way. Do not use floor polish or wax that makes floors slippery. If you must use wax, use non-skid floor wax. Do not have throw rugs and other things on the floor that can make you trip. What can I do with my stairs? Do not leave any items on the stairs. Make sure that there are handrails on both sides of the stairs and use them. Fix handrails that are broken or loose. Make sure that handrails are as long as the stairways. Check any carpeting to make sure that it is firmly attached to the stairs. Fix any carpet that is loose or worn. Avoid having throw rugs at the top or bottom of the stairs. If you do have throw rugs, attach them to the floor with carpet tape. Make sure that you have a light switch at the top of the stairs  and the bottom of the stairs. If you do not have them, ask someone to add them for you. What else can I do to help prevent falls? Wear shoes that: Do not have high heels. Have rubber bottoms. Are comfortable and fit you well. Are closed at the toe. Do not wear sandals. If you use a stepladder: Make sure that it is fully opened. Do not climb a closed stepladder. Make sure that both sides of the stepladder are locked into place. Ask someone to hold it for you, if possible. Clearly mark and make sure that you can see: Any grab bars or handrails. First and last steps. Where the edge of each step is. Use tools that help you move around (mobility aids) if they are needed. These include: Canes. Walkers. Scooters. Crutches. Turn on the lights when you go into a dark area. Replace any  light bulbs as soon as they burn out. Set up your furniture so you have a clear path. Avoid moving your furniture around. If any of your floors are uneven, fix them. If there are any pets around you, be aware of where they are. Review your medicines with your doctor. Some medicines can make you feel dizzy. This can increase your chance of falling. Ask your doctor what other things that you can do to help prevent falls. This information is not intended to replace advice given to you by your health care provider. Make sure you discuss any questions you have with your health care provider. Document Released: 01/29/2009 Document Revised: 09/10/2015 Document Reviewed: 05/09/2014 Elsevier Interactive Patient Education  2017 [employer].

## 2023-01-05 ENCOUNTER — Other Ambulatory Visit: Payer: Self-pay | Admitting: Family Medicine

## 2023-01-25 ENCOUNTER — Other Ambulatory Visit: Payer: Self-pay | Admitting: Family Medicine

## 2023-01-25 ENCOUNTER — Ambulatory Visit: Payer: Self-pay

## 2023-01-25 DIAGNOSIS — R0781 Pleurodynia: Secondary | ICD-10-CM

## 2023-01-30 ENCOUNTER — Other Ambulatory Visit: Payer: Self-pay | Admitting: Family Medicine

## 2023-02-06 ENCOUNTER — Other Ambulatory Visit: Payer: Self-pay | Admitting: Family Medicine

## 2023-02-16 ENCOUNTER — Other Ambulatory Visit: Payer: Self-pay | Admitting: Family Medicine

## 2023-02-21 ENCOUNTER — Other Ambulatory Visit: Payer: Self-pay | Admitting: Family Medicine

## 2023-03-19 ENCOUNTER — Other Ambulatory Visit: Payer: Self-pay | Admitting: Family Medicine

## 2023-06-19 ENCOUNTER — Other Ambulatory Visit: Payer: Self-pay | Admitting: Family Medicine

## 2023-06-28 ENCOUNTER — Other Ambulatory Visit: Payer: Self-pay | Admitting: Family Medicine

## 2023-08-01 ENCOUNTER — Other Ambulatory Visit: Payer: Self-pay | Admitting: Family Medicine

## 2023-08-01 MED ORDER — LISINOPRIL 40 MG PO TABS: 40.0000 mg | ORAL_TABLET | Freq: Every day | ORAL | 1 refills | Status: DC

## 2023-09-05 ENCOUNTER — Other Ambulatory Visit: Payer: Self-pay | Admitting: Family Medicine

## 2023-09-13 DIAGNOSIS — I10 Essential (primary) hypertension: Secondary | ICD-10-CM | POA: Diagnosis not present

## 2023-09-13 DIAGNOSIS — Z1211 Encounter for screening for malignant neoplasm of colon: Secondary | ICD-10-CM | POA: Diagnosis not present

## 2023-09-13 DIAGNOSIS — Z13820 Encounter for screening for osteoporosis: Secondary | ICD-10-CM | POA: Diagnosis not present

## 2023-09-13 DIAGNOSIS — Z1231 Encounter for screening mammogram for malignant neoplasm of breast: Secondary | ICD-10-CM | POA: Diagnosis not present

## 2023-09-14 ENCOUNTER — Other Ambulatory Visit: Payer: Self-pay

## 2023-09-14 ENCOUNTER — Telehealth: Payer: Self-pay | Admitting: Gastroenterology

## 2023-09-14 ENCOUNTER — Encounter: Payer: Self-pay | Admitting: Physician Assistant

## 2023-09-14 MED ORDER — MIRTAZAPINE 15 MG PO TABS: 15.0000 mg | ORAL_TABLET | Freq: Every day | ORAL | 0 refills | Status: DC

## 2023-09-14 NOTE — Telephone Encounter (Signed)
 Called and spoke to patient. Let her know that since we have not seen her for over 3 years we are unable to refill prescriptions.  She has an appointment in August and we can address her needs at that appointment. In the meantime I encouraged her to reach out to her PCP. She expressed understanding.

## 2023-09-14 NOTE — Telephone Encounter (Signed)
 Patient called and stated that she is needing a refill on her GERD medication. Patient does not remember the name of her medication. I did not see any acid reflux medication in her med list. Please advise.

## 2023-09-25 DIAGNOSIS — M8588 Other specified disorders of bone density and structure, other site: Secondary | ICD-10-CM | POA: Diagnosis not present

## 2023-10-09 ENCOUNTER — Ambulatory Visit: Admitting: Family Medicine

## 2023-10-09 ENCOUNTER — Encounter: Payer: Self-pay | Admitting: Family Medicine

## 2023-10-09 VITALS — BP 118/67 | HR 65 | Ht 62.0 in | Wt 140.6 lb

## 2023-10-09 DIAGNOSIS — F418 Other specified anxiety disorders: Secondary | ICD-10-CM

## 2023-10-09 DIAGNOSIS — R7303 Prediabetes: Secondary | ICD-10-CM | POA: Diagnosis not present

## 2023-10-09 DIAGNOSIS — E785 Hyperlipidemia, unspecified: Secondary | ICD-10-CM

## 2023-10-09 DIAGNOSIS — Z0000 Encounter for general adult medical examination without abnormal findings: Secondary | ICD-10-CM

## 2023-10-09 DIAGNOSIS — I10 Essential (primary) hypertension: Secondary | ICD-10-CM | POA: Diagnosis not present

## 2023-10-09 DIAGNOSIS — Z23 Encounter for immunization: Secondary | ICD-10-CM | POA: Diagnosis not present

## 2023-10-09 LAB — POCT GLYCOSYLATED HEMOGLOBIN (HGB A1C): HbA1c, POC (prediabetic range): 5.9 % (ref 5.7–6.4)

## 2023-10-09 MED ORDER — ESCITALOPRAM OXALATE 10 MG PO TABS: 10.0000 mg | ORAL_TABLET | Freq: Every day | ORAL | 0 refills | Status: DC

## 2023-10-09 NOTE — Progress Notes (Unsigned)
  Date of Visit: 10/09/2023   SUBJECTIVE:   HPI:  Regina Sawyer presents today for follow up.  Stress - has been under lots of stress recently. Husband had his second amputation. Patient has had poor appetite related to stress and as a result has lost weight. Thinks she's doing a little better, trying to eat more. Currently takes remeron  15mg  at night to help with sleep, previously took lexapro  20mg  daily but stopped this some time ago, willing to restart. Denies SI/HI.  Hyperlipidemia - taking crestor  20mg  daily, tolerating well  Hypertension - taking lisinopril  40mg  daily and carvedilol  6.25mg  twice daily, tolerating well  OBJECTIVE:   BP 118/67   Pulse 65   Ht 5' 2 (1.575 m)   Wt 140 lb 9.6 oz (63.8 kg)   SpO2 100%   BMI 25.72 kg/m  Gen: no acute distress, pleasant, cooperative HEENT: normocephalic, atraumatic  Heart: regular rate and rhythm Lungs: clear to auscultation bilaterally  Abdomen: soft, nontender to palpation, no masses or organomegaly Neuro: alert grossly nonfocal speech normal Psych: normal range of affect, well groomed, speech normal in rate and volume, normal eye contact   ASSESSMENT/PLAN:   Assessment & Plan Other specified anxiety disorders Suspect stress and mood have led to decreased appetite and subsequent weight loss Check CBC to ensure not anemic, but doubt this will be the case Start back lexapro  10mg  daily, follow up in 1 month  Prediabetes A1c updated today, 5.9, continue to monitor Routine adult health maintenance Reports had DEXA through OB/GYNs office - will request records Prevnar 20 given today HYPERTENSION, BENIGN ESSENTIAL Well cont, continue medications, update BMET today Hyperlipidemia, unspecified hyperlipidemia type Update lipids, continue statin   FOLLOW UP: Follow up in 1 mo for above issues  [place] J. Donah, MD [facility]

## 2023-10-09 NOTE — Patient Instructions (Signed)
 It was great to see you again today.  Checking labs today  Pneumonia vaccine today  Restart lexapro   Follow up in 1 month   Be well, Dr. Donah

## 2023-10-10 LAB — BASIC METABOLIC PANEL WITH GFR
BUN/Creatinine Ratio: 13 (ref 12–28)
BUN: 17 mg/dL (ref 8–27)
CO2: 25 mmol/L (ref 20–29)
Calcium: 9.9 mg/dL (ref 8.7–10.3)
Chloride: 102 mmol/L (ref 96–106)
Creatinine, Ser: 1.31 mg/dL — ABNORMAL HIGH (ref 0.57–1.00)
Glucose: 109 mg/dL — ABNORMAL HIGH (ref 70–99)
Potassium: 4.5 mmol/L (ref 3.5–5.2)
Sodium: 143 mmol/L (ref 134–144)
eGFR: 45 mL/min/{1.73_m2} — ABNORMAL LOW (ref >59)

## 2023-10-10 LAB — LIPID PANEL
Chol/HDL Ratio: 2.3 ratio (ref 0.0–4.4)
Cholesterol, Total: 146 mg/dL (ref 100–199)
HDL: 63 mg/dL (ref >39)
LDL Chol Calc (NIH): 68 mg/dL (ref 0–99)
Triglycerides: 77 mg/dL (ref 0–149)
VLDL Cholesterol Cal: 15 mg/dL (ref 5–40)

## 2023-10-10 LAB — CBC
Hematocrit: 43.1 % (ref 34.0–46.6)
Hemoglobin: 13 g/dL (ref 11.1–15.9)
MCH: 27.5 pg (ref 26.6–33.0)
MCHC: 30.2 g/dL — ABNORMAL LOW (ref 31.5–35.7)
MCV: 91 fL (ref 79–97)
Platelets: 321 10*3/uL (ref 150–450)
RBC: 4.72 x10E6/uL (ref 3.77–5.28)
RDW: 13.9 % (ref 11.7–15.4)
WBC: 5.7 10*3/uL (ref 3.4–10.8)

## 2023-10-11 DIAGNOSIS — Z0000 Encounter for general adult medical examination without abnormal findings: Secondary | ICD-10-CM | POA: Insufficient documentation

## 2023-10-11 NOTE — Assessment & Plan Note (Signed)
 Well cont, continue medications, update BMET today

## 2023-10-11 NOTE — Assessment & Plan Note (Signed)
 Suspect stress and mood have led to decreased appetite and subsequent weight loss Check CBC to ensure not anemic, but doubt this will be the case Start back lexapro  10mg  daily, follow up in 1 month

## 2023-10-11 NOTE — Assessment & Plan Note (Signed)
 Reports had DEXA through OB/GYNs office - will request records Prevnar 20 given today

## 2023-10-11 NOTE — Assessment & Plan Note (Signed)
 Update lipids, continue statin

## 2023-10-11 NOTE — Assessment & Plan Note (Signed)
 A1c updated today, 5.9, continue to monitor

## 2023-10-16 ENCOUNTER — Telehealth: Payer: Self-pay

## 2023-10-16 NOTE — Telephone Encounter (Signed)
 Patient LVM on nurse line regarding her lab results.   She states that she is not able to use mychart and that we either need to call her or send her a letter with these results. Patient is requesting these results as soon as possible.   Will forward to PCP.  Chiquita JAYSON English, RN

## 2023-10-18 NOTE — Telephone Encounter (Signed)
 Called patient and reviewed results Overall unremarkable. Patient appreciative.  Regina Sawyer [name], MD

## 2023-11-02 ENCOUNTER — Other Ambulatory Visit: Payer: Self-pay | Admitting: Family Medicine

## 2023-11-03 NOTE — Telephone Encounter (Signed)
 Please let patient know I am refilling this medication, but she needs to schedule an appointment with me to follow up on her mood  Thanks, Laymon [name], MD

## 2023-11-07 DIAGNOSIS — L6689 Other cicatricial alopecia: Secondary | ICD-10-CM | POA: Diagnosis not present

## 2023-11-20 ENCOUNTER — Ambulatory Visit: Admitting: Physician Assistant

## 2023-12-04 ENCOUNTER — Ambulatory Visit: Admitting: Family Medicine

## 2023-12-11 ENCOUNTER — Other Ambulatory Visit: Payer: Self-pay | Admitting: Family Medicine

## 2024-01-03 ENCOUNTER — Other Ambulatory Visit: Payer: Self-pay | Admitting: Family Medicine

## 2024-01-03 DIAGNOSIS — M25511 Pain in right shoulder: Secondary | ICD-10-CM | POA: Diagnosis not present

## 2024-01-05 ENCOUNTER — Other Ambulatory Visit: Payer: Self-pay | Admitting: Family Medicine

## 2024-01-09 ENCOUNTER — Other Ambulatory Visit: Payer: Self-pay | Admitting: Family Medicine

## 2024-01-12 NOTE — Telephone Encounter (Signed)
Please let patient know I am refilling this medication, but she needs to schedule an appointment with me.   Thanks, Leeanne Rio, MD

## 2024-01-22 ENCOUNTER — Encounter

## 2024-01-26 DIAGNOSIS — Z1231 Encounter for screening mammogram for malignant neoplasm of breast: Secondary | ICD-10-CM | POA: Diagnosis not present

## 2024-01-26 LAB — HM MAMMOGRAPHY

## 2024-01-29 ENCOUNTER — Encounter: Payer: Self-pay | Admitting: Family Medicine

## 2024-02-20 ENCOUNTER — Ambulatory Visit: Admitting: Family Medicine

## 2024-04-17 ENCOUNTER — Other Ambulatory Visit: Payer: Self-pay | Admitting: Family Medicine

## 2024-04-22 ENCOUNTER — Other Ambulatory Visit: Payer: Self-pay | Admitting: Family Medicine

## 2024-04-23 ENCOUNTER — Other Ambulatory Visit: Payer: Self-pay | Admitting: Family Medicine

## 2024-04-24 NOTE — Telephone Encounter (Signed)
Please let patient know I am refilling this medication, but she needs to schedule an appointment with me.   Thanks, Leeanne Rio, MD

## 2024-04-25 NOTE — Telephone Encounter (Signed)
 Reached out to patient to schedule this appointment. No answer, LVM

## 2024-04-29 ENCOUNTER — Ambulatory Visit

## 2024-04-29 VITALS — Ht 62.0 in | Wt 165.0 lb

## 2024-04-29 DIAGNOSIS — Z0000 Encounter for general adult medical examination without abnormal findings: Secondary | ICD-10-CM | POA: Diagnosis not present

## 2024-04-29 NOTE — Progress Notes (Signed)
 "  Chief Complaint  Patient presents with   Medicare Wellness    SUBSEQUENT     Subjective:   Regina Sawyer is a 69 y.o. female who presents for a Medicare Annual Wellness Visit.  Visit info / Clinical Intake: Medicare Wellness Visit Type:: Subsequent Annual Wellness Visit Persons participating in visit and providing information:: patient Medicare Wellness Visit Mode:: Telephone If telephone:: video declined Since this visit was completed virtually, some vitals may be partially provided or unavailable. Missing vitals are due to the limitations of the virtual format.: Documented vitals are patient reported If Telephone or Video please confirm:: I connected with patient using audio/video enable telemedicine. I verified patient identity with two identifiers, discussed telehealth limitations, and patient agreed to proceed. Patient Location:: Home Provider Location:: Office Interpreter Needed?: No Pre-visit prep was completed: yes AWV questionnaire completed by patient prior to visit?: no Living arrangements:: lives with spouse/significant other Patient's Overall Health Status Rating: good Typical amount of pain: none Does pain affect daily life?: no Are you currently prescribed opioids?: no  Dietary Habits and Nutritional Risks How many meals a day?: 2 Eats fruit and vegetables daily?: yes Most meals are obtained by: preparing own meals In the last 2 weeks, have you had any of the following?: none Diabetic:: no  Functional Status Activities of Daily Living (to include ambulation/medication): Independent Ambulation: Independent Medication Administration: Independent Home Management (perform basic housework or laundry): Independent Manage your own finances?: yes Primary transportation is: driving Concerns about vision?: no *vision screening is required for WTM* (eyeglasses; [employer]) Concerns about hearing?: no  Fall Screening Falls in the past year?: 0 Number  of falls in past year: 0 Was there an injury with Fall?: 0 Fall Risk Category Calculator: 0 Patient Fall Risk Level: Low Fall Risk  Fall Risk Patient at Risk for Falls Due to: No Fall Risks Fall risk Follow up: Falls evaluation completed; Education provided  Home and Transportation Safety: All rugs have non-skid backing?: N/A, no rugs All stairs or steps have railings?: N/A, no stairs Grab bars in the bathtub or shower?: (!) no Have non-skid surface in bathtub or shower?: (!) no Good home lighting?: yes Regular seat belt use?: yes Hospital stays in the last year:: no  Cognitive Assessment Difficulty concentrating, remembering, or making decisions? : no Will 6CIT or Mini Cog be Completed: yes What year is it?: 0 points What month is it?: 0 points Give patient an address phrase to remember (5 components): [address] [place], [place] About what time is it?: 0 points Count backwards from 20 to 1: 0 points Say the months of the year in reverse: 0 points Repeat the address phrase from earlier: 0 points 6 CIT Score: 0 points  Advance Directives (For Healthcare) Does Patient Have a Medical Advance Directive?: No Would patient like information on creating a medical advance directive?: No - Patient declined  Reviewed/Updated  Reviewed/Updated: Reviewed All (Medical, Surgical, Family, Medications, Allergies, Care Teams, Patient Goals)    Allergies (verified) Lipitor [atorvastatin ], Lactose, and Latex   Current Medications (verified) Outpatient Encounter Medications as of 04/29/2024  Medication Sig   carvedilol  (COREG ) 6.25 MG tablet TAKE 1 TABLET BY MOUTH TWICE A DAY WITH FOOD   cyclobenzaprine  (FLEXERIL ) 5 MG tablet Take 1 tablet (5 mg total) by mouth 3 (three) times daily as needed for muscle spasms.   diclofenac  Sodium (VOLTAREN ) 1 % GEL Apply 2 g topically 4 (four) times daily. (Patient taking differently: Apply 2 g  topically 4 (four) times daily as needed.)   escitalopram   (LEXAPRO ) 10 MG tablet TAKE 1 TABLET BY MOUTH EVERY DAY   lisinopril  (ZESTRIL ) 40 MG tablet TAKE 1 TABLET BY MOUTH DAILY   mirtazapine  (REMERON ) 15 MG tablet TAKE 1 TABLET (15 MG TOTAL) BY MOUTH DAILY.   rosuvastatin  (CRESTOR ) 20 MG tablet TAKE 1 TABLET BY MOUTH EVERY DAY   No facility-administered encounter medications on file as of 04/29/2024.    History: Past Medical History:  Diagnosis Date   Allergy    Anxiety    Arthritis    Asthma    Atrophic vaginitis 2009   BV (bacterial vaginosis) 2005   Candida vaginitis 2006   Candida vaginitis 2009   Depression    Dysuria 2007   Female pelvic pain 2006   Fibroid 2005   GERD (gastroesophageal reflux disease)    H/O constipation 2009   H/O dyspareunia 2008   H/O urinary frequency 2011   H/O vaginitis 2005   Heart murmur    History of bacterial infection    History of hemorrhoids 2009   Hx: UTI (urinary tract infection) 2007   Hyperlipidemia    controlled    Hypertension    controlled    Irregular periods/menstrual cycles 2007   Libido, decreased 2006   Menopausal symptoms 2007   Nocturia 2009   Proteinuria 2009   Sickle cell trait    pt denies Sickle cell trait or disease 05-26-2020   Tenderness of female pelvic organs 2005   Ulcer    Vaginal irritation 2011   Past Surgical History:  Procedure Laterality Date   CARPAL TUNNEL RELEASE     both wrists   COLONOSCOPY N/A 10/28/2014   Procedure: COLONOSCOPY;  Surgeon: Lamar JONETTA Aho, MD;  Location: [facility];  Service: Endoscopy;  Laterality: N/A;   COLONOSCOPY     DENTAL SURGERY     ESOPHAGOGASTRODUODENOSCOPY N/A 08/06/2014   Procedure: ESOPHAGOGASTRODUODENOSCOPY (EGD);  Surgeon: Lamar JONETTA Aho, MD;  Location: [facility];  Service: Endoscopy;  Laterality: N/A;   HEEL SPUR SURGERY Right    POLYPECTOMY     SHOULDER ARTHROSCOPY WITH ROTATOR CUFF REPAIR AND SUBACROMIAL DECOMPRESSION Right 10/20/2021   Procedure: SHOULDER ARTHROSCOPY WITH SUBACROMIAL DECOMPRESSION, MINI  OPEN ROTATOR CUFF REPAIR AND DISTAL CLAVICLE RESECTION;  Surgeon: Duwayne Purchase, MD;  Location: [facility];  Service: Orthopedics;  Laterality: Right;   TUBAL LIGATION     UPPER GASTROINTESTINAL ENDOSCOPY     Family History  Problem Relation Age of Onset   Diabetes Sister    Hypertension Sister    Heart failure Father    Heart failure Brother        x2   Gallstones Mother    Colon polyps Neg Hx    Kidney disease Neg Hx    Esophageal cancer Neg Hx    Gallbladder disease Neg Hx    Rectal cancer Neg Hx    Stomach cancer Neg Hx    Social History   Occupational History   Occupation: Health Visitor  Tobacco Use   Smoking status: Never    Passive exposure: Yes   Smokeless tobacco: Never  Vaping Use   Vaping status: Never Used  Substance and Sexual Activity   Alcohol use: No   Drug use: No   Sexual activity: Not on file    Comment: BTL   Tobacco Counseling Counseling given: Not Answered  SDOH Screenings   Food Insecurity: No Food Insecurity (04/29/2024)  Housing: Low Risk (04/29/2024)  Transportation Needs: No Transportation Needs (04/29/2024)  Utilities: Not At Risk (04/29/2024)  Alcohol Screen: Low Risk (04/29/2024)  Depression (PHQ2-9): Low Risk (04/29/2024)  Financial Resource Strain: Low Risk (04/29/2024)  Physical Activity: Sufficiently Active (04/29/2024)  Social Connections: Unknown (04/29/2024)  Stress: No Stress Concern Present (04/29/2024)  Tobacco Use: Medium Risk (04/29/2024)  Health Literacy: Adequate Health Literacy (04/29/2024)   See flowsheets for full screening details  Depression Screen PHQ 2 & 9 Depression Scale- Over the past 2 weeks, how often have you been bothered by any of the following problems? Little interest or pleasure in doing things: 0 Feeling down, depressed, or hopeless (PHQ Adolescent also includes...irritable): 0 PHQ-2 Total Score: 0 Trouble falling or staying asleep, or sleeping too much: 0 Feeling tired or having little energy: 0 Poor  appetite or overeating (PHQ Adolescent also includes...weight loss): 0 Feeling bad about yourself - or that you are a failure or have let yourself or your family down: 0 Trouble concentrating on things, such as reading the newspaper or watching television (PHQ Adolescent also includes...like school work): 0 Moving or speaking so slowly that other people could have noticed. Or the opposite - being so fidgety or restless that you have been moving around a lot more than usual: 0 Thoughts that you would be better off dead, or of hurting yourself in some way: 0 PHQ-9 Total Score: 0 If you checked off any problems, how difficult have these problems made it for you to do your work, take care of things at home, or get along with other people?: Not difficult at all  Depression Treatment Depression Interventions/Treatment : EYV7-0 Score <4 Follow-up Not Indicated     Goals Addressed             This Visit's Progress    04/29/2024: My goal for 2026 is to keep my health in good standing and stay independent.               Objective:    Today's Vitals   04/29/24 1504  Weight: 165 lb (74.8 kg)  Height: 5' 2 (1.575 m)  PainSc: 0-No pain   Body mass index is 30.18 kg/m.  Hearing/Vision screen No results found. Immunizations and Health Maintenance Health Maintenance  Topic Date Due   Bone Density Scan  Never done   COVID-19 Vaccine (9 - 2025-26 season) 12/18/2023   DTaP/Tdap/Td (3 - Td or Tdap) 12/03/2024   Medicare Annual Wellness (AWV)  04/29/2025   Colonoscopy  06/09/2025   Mammogram  01/25/2026   Pneumococcal Vaccine: 50+ Years  Completed   Influenza Vaccine  Completed   Hepatitis C Screening  Completed   Zoster Vaccines- Shingrix   Completed   Meningococcal B Vaccine  Aged Out        Assessment/Plan:  This is a routine wellness examination for Regina Sawyer.  Patient Care Team: Donah Laymon PARAS, MD as PCP - General (Family Medicine) Wonda Cy BROCKS, RD as Dietitian (Family  Medicine) Duwayne Purchase, MD as Consulting Physician (Orthopedic Surgery) Anderson, Dena D, PA-C as Physician Assistant (Dermatology) Perri Bjork, PA-C as Physician Assistant (Obstetrics and Gynecology) [facility] as Consulting Physician (Ophthalmology)  I have personally reviewed and noted the following in the patients chart:   Medical and social history Use of alcohol, tobacco or illicit drugs  Current medications and supplements including opioid prescriptions. Functional ability and status Nutritional status Physical activity Advanced directives List of other physicians Hospitalizations, surgeries, and ER visits in previous 12 months Vitals Screenings to  include cognitive, depression, and falls Referrals and appointments  No orders of the defined types were placed in this encounter.  In addition, I have reviewed and discussed with patient certain preventive protocols, quality metrics, and best practice recommendations. A written personalized care plan for preventive services as well as general preventive health recommendations were provided to patient.   Roz LOISE Fuller, LPN   [id number]/[id number]   Return in about 1 year (around 04/29/2025) for Medicare wellness.  After Visit Summary: (MyChart) Due to this being a telephonic visit, the after visit summary with patients personalized plan was offered to patient via MyChart   Nurse Notes:  No voiced or noted concerns at this time HM Addressed: Vaccines Due: Covid-19; NCIR was verified and documented in patients record. Patient is due for: Bone Density Scan.  "

## 2024-04-29 NOTE — Patient Instructions (Addendum)
 Regina Sawyer,  Thank you for taking the time for your Medicare Wellness Visit. I appreciate your continued commitment to your health goals. Please review the care plan we discussed, and feel free to reach out if I can assist you further.  Please note that Annual Wellness Visits do not include a physical exam. Some assessments may be limited, especially if the visit was conducted virtually. If needed, we may recommend an in-person follow-up with your provider.  Ongoing Care Seeing your primary care provider every 3 to 6 months helps us  monitor your health and provide consistent, personalized care.   Referrals If a referral was made during today's visit and you haven't received any updates within two weeks, please contact the referred provider directly to check on the status.  Recommended Screenings:  Health Maintenance  Topic Date Due   Osteoporosis screening with Bone Density Scan  Never done   COVID-19 Vaccine (9 - 2025-26 season) 12/18/2023   DTaP/Tdap/Td vaccine (3 - Td or Tdap) 12/03/2024   Medicare Annual Wellness Visit  04/29/2025   Colon Cancer Screening  06/09/2025   Breast Cancer Screening  01/25/2026   Pneumococcal Vaccine for age over 79  Completed   Flu Shot  Completed   Hepatitis C Screening  Completed   Zoster (Shingles) Vaccine  Completed   Meningitis B Vaccine  Aged Out       04/29/2024    3:05 PM  Advanced Directives  Does Patient Have a Medical Advance Directive? No  Would patient like information on creating a medical advance directive? No - Patient declined    Vision: Annual vision screenings are recommended for early detection of glaucoma, cataracts, and diabetic retinopathy. These exams can also reveal signs of chronic conditions such as diabetes and high blood pressure.  Dental: Annual dental screenings help detect early signs of oral cancer, gum disease, and other conditions linked to overall health, including heart disease and diabetes.  Please see the  attached documents for additional preventive care recommendations.

## 2024-05-13 ENCOUNTER — Ambulatory Visit: Admitting: Podiatry

## 2024-05-17 ENCOUNTER — Telehealth: Payer: Self-pay

## 2024-05-17 NOTE — Telephone Encounter (Signed)
 Patient calls nurse line reporting confusion on Lisinopril  dosing.   She reports she received Lisinopril  40mg  from [facility], however went to [facility] and was able to pick up Lisinopril  20mg .   Advised per notes, patient is taking Lisinopril  40mg .   Patient voiced understanding.

## 2024-05-17 NOTE — Telephone Encounter (Signed)
 Yes, she should be on lisinopril  40mg  daily is my understanding  Laymon [name], MD

## 2024-05-23 ENCOUNTER — Other Ambulatory Visit: Payer: Self-pay | Admitting: Family Medicine

## 2024-05-23 MED ORDER — MIRTAZAPINE 15 MG PO TABS: 15.0000 mg | ORAL_TABLET | Freq: Every day | ORAL | 0 refills | Status: AC

## 2024-05-30 ENCOUNTER — Ambulatory Visit: Admitting: Family Medicine
